# Patient Record
Sex: Male | Born: 1945
Health system: Southern US, Community
[De-identification: ages and names within clinical notes are randomized; demographics above are authoritative.]

## PROBLEM LIST (undated history)

## (undated) DIAGNOSIS — M1990 Unspecified osteoarthritis, unspecified site: Secondary | ICD-10-CM

## (undated) DIAGNOSIS — F419 Anxiety disorder, unspecified: Secondary | ICD-10-CM

## (undated) DIAGNOSIS — J189 Pneumonia, unspecified organism: Secondary | ICD-10-CM

## (undated) DIAGNOSIS — C4491 Basal cell carcinoma of skin, unspecified: Secondary | ICD-10-CM

## (undated) DIAGNOSIS — G629 Polyneuropathy, unspecified: Secondary | ICD-10-CM

## (undated) DIAGNOSIS — F32A Depression, unspecified: Secondary | ICD-10-CM

## (undated) DIAGNOSIS — K429 Umbilical hernia without obstruction or gangrene: Secondary | ICD-10-CM

## (undated) DIAGNOSIS — I2699 Other pulmonary embolism without acute cor pulmonale: Secondary | ICD-10-CM

## (undated) DIAGNOSIS — M5416 Radiculopathy, lumbar region: Secondary | ICD-10-CM

## (undated) DIAGNOSIS — K219 Gastro-esophageal reflux disease without esophagitis: Secondary | ICD-10-CM

## (undated) DIAGNOSIS — IMO0001 Reserved for inherently not codable concepts without codable children: Secondary | ICD-10-CM

## (undated) DIAGNOSIS — F329 Major depressive disorder, single episode, unspecified: Secondary | ICD-10-CM

## (undated) DIAGNOSIS — K649 Unspecified hemorrhoids: Secondary | ICD-10-CM

## (undated) DIAGNOSIS — M109 Gout, unspecified: Secondary | ICD-10-CM

## (undated) DIAGNOSIS — I499 Cardiac arrhythmia, unspecified: Secondary | ICD-10-CM

## (undated) DIAGNOSIS — I10 Essential (primary) hypertension: Secondary | ICD-10-CM

## (undated) DIAGNOSIS — G4730 Sleep apnea, unspecified: Secondary | ICD-10-CM

## (undated) DIAGNOSIS — K635 Polyp of colon: Secondary | ICD-10-CM

## (undated) HISTORY — PX: COLONOSCOPY: SHX174

## (undated) HISTORY — DX: Gastro-esophageal reflux disease without esophagitis: K21.9

## (undated) HISTORY — PX: ESOPHAGOGASTRODUODENOSCOPY: SHX1529

## (undated) HISTORY — PX: HERNIA REPAIR: SHX51

## (undated) HISTORY — DX: Reserved for inherently not codable concepts without codable children: IMO0001

## (undated) HISTORY — DX: Unspecified osteoarthritis, unspecified site: M19.90

## (undated) HISTORY — DX: Radiculopathy, lumbar region: M54.16

## (undated) HISTORY — DX: Anxiety disorder, unspecified: F41.9

## (undated) HISTORY — DX: Other pulmonary embolism without acute cor pulmonale: I26.99

---

## 2005-11-16 ENCOUNTER — Ambulatory Visit: Payer: Self-pay | Admitting: Surgery

## 2006-09-16 ENCOUNTER — Ambulatory Visit: Payer: Self-pay | Admitting: Gastroenterology

## 2008-01-01 ENCOUNTER — Ambulatory Visit: Payer: Self-pay | Admitting: Family Medicine

## 2008-08-16 ENCOUNTER — Ambulatory Visit: Payer: Self-pay | Admitting: Gastroenterology

## 2010-01-29 DIAGNOSIS — C4491 Basal cell carcinoma of skin, unspecified: Secondary | ICD-10-CM

## 2010-01-29 HISTORY — DX: Basal cell carcinoma of skin, unspecified: C44.91

## 2011-03-18 ENCOUNTER — Ambulatory Visit: Payer: Self-pay | Admitting: Gastroenterology

## 2011-12-08 ENCOUNTER — Ambulatory Visit: Payer: Self-pay | Admitting: Physical Medicine and Rehabilitation

## 2012-07-24 ENCOUNTER — Emergency Department: Payer: Self-pay | Admitting: Emergency Medicine

## 2012-07-24 LAB — COMPREHENSIVE METABOLIC PANEL
Albumin: 4.1 g/dL (ref 3.4–5.0)
Alkaline Phosphatase: 98 U/L (ref 50–136)
Bilirubin,Total: 0.7 mg/dL (ref 0.2–1.0)
Calcium, Total: 9 mg/dL (ref 8.5–10.1)
Creatinine: 0.97 mg/dL (ref 0.60–1.30)
EGFR (African American): >60
Glucose: 86 mg/dL (ref 65–99)
Osmolality: 275 (ref 275–301)
Sodium: 139 mmol/L (ref 136–145)
Total Protein: 7.8 g/dL (ref 6.4–8.2)

## 2012-07-24 LAB — TROPONIN I: Troponin-I: <0.02 ng/mL

## 2012-07-24 LAB — CK TOTAL AND CKMB (NOT AT ARMC): CK, Total: 113 U/L (ref 35–232)

## 2012-07-24 LAB — CBC
HCT: 38.5 % — ABNORMAL LOW (ref 40.0–52.0)
MCV: 91 fL (ref 80–100)
Platelet: 223 10*3/uL (ref 150–440)
RBC: 4.22 10*6/uL — ABNORMAL LOW (ref 4.40–5.90)
WBC: 10 10*3/uL (ref 3.8–10.6)

## 2012-08-08 LAB — CBC
HCT: 37.8 % — ABNORMAL LOW (ref 40.0–52.0)
MCH: 31.1 pg (ref 26.0–34.0)
MCV: 89 fL (ref 80–100)
Platelet: 291 10*3/uL (ref 150–440)
RBC: 4.23 10*6/uL — ABNORMAL LOW (ref 4.40–5.90)
RDW: 13.9 % (ref 11.5–14.5)

## 2012-08-08 LAB — CK TOTAL AND CKMB (NOT AT ARMC)
CK, Total: 125 U/L (ref 35–232)
CK-MB: 1.8 ng/mL (ref 0.5–3.6)

## 2012-08-08 LAB — BASIC METABOLIC PANEL
Anion Gap: 9 (ref 7–16)
Calcium, Total: 9.2 mg/dL (ref 8.5–10.1)
Chloride: 96 mmol/L — ABNORMAL LOW (ref 98–107)
Co2: 28 mmol/L (ref 21–32)
Creatinine: 1.07 mg/dL (ref 0.60–1.30)
Osmolality: 264 (ref 275–301)
Potassium: 3.5 mmol/L (ref 3.5–5.1)
Sodium: 133 mmol/L — ABNORMAL LOW (ref 136–145)

## 2012-08-09 ENCOUNTER — Inpatient Hospital Stay: Payer: Self-pay | Admitting: Internal Medicine

## 2012-08-09 LAB — TSH: Thyroid Stimulating Horm: 2.32 u[IU]/mL

## 2012-08-09 LAB — PRO B NATRIURETIC PEPTIDE: B-Type Natriuretic Peptide: 145 pg/mL — ABNORMAL HIGH (ref 0–125)

## 2012-08-10 LAB — CBC WITH DIFFERENTIAL/PLATELET
Basophil #: 0 10*3/uL (ref 0.0–0.1)
Basophil %: 0.2 %
Eosinophil %: 0.2 %
Lymphocyte #: 2.1 10*3/uL (ref 1.0–3.6)
MCH: 30.8 pg (ref 26.0–34.0)
MCHC: 34.7 g/dL (ref 32.0–36.0)
MCV: 89 fL (ref 80–100)
Monocyte #: 0.6 x10 3/mm (ref 0.2–1.0)
Platelet: 276 10*3/uL (ref 150–440)
RBC: 3.75 10*6/uL — ABNORMAL LOW (ref 4.40–5.90)
RDW: 13.7 % (ref 11.5–14.5)

## 2012-08-10 LAB — BASIC METABOLIC PANEL
BUN: 13 mg/dL (ref 7–18)
Calcium, Total: 8.7 mg/dL (ref 8.5–10.1)
Co2: 27 mmol/L (ref 21–32)
EGFR (African American): >60
EGFR (Non-African Amer.): >60
Glucose: 99 mg/dL (ref 65–99)
Osmolality: 276 (ref 275–301)
Sodium: 138 mmol/L (ref 136–145)

## 2012-08-10 LAB — LIPID PANEL
Cholesterol: 200 mg/dL (ref 0–200)
HDL Cholesterol: 39 mg/dL — ABNORMAL LOW (ref 40–60)
Triglycerides: 94 mg/dL (ref 0–200)
VLDL Cholesterol, Calc: 19 mg/dL (ref 5–40)

## 2012-08-10 LAB — MAGNESIUM: Magnesium: 2.2 mg/dL

## 2013-04-07 ENCOUNTER — Emergency Department: Payer: Self-pay | Admitting: Emergency Medicine

## 2013-04-07 LAB — BASIC METABOLIC PANEL
Anion Gap: 4 — ABNORMAL LOW (ref 7–16)
BUN: 17 mg/dL (ref 7–18)
Chloride: 101 mmol/L (ref 98–107)
Co2: 31 mmol/L (ref 21–32)
Glucose: 120 mg/dL — ABNORMAL HIGH (ref 65–99)
Potassium: 3.7 mmol/L (ref 3.5–5.1)
Sodium: 136 mmol/L (ref 136–145)

## 2013-04-07 LAB — CBC
HGB: 13.3 g/dL (ref 13.0–18.0)
MCV: 90 fL (ref 80–100)
Platelet: 301 10*3/uL (ref 150–440)
RDW: 13.9 % (ref 11.5–14.5)
WBC: 9.3 10*3/uL (ref 3.8–10.6)

## 2013-04-07 LAB — PROTIME-INR: INR: 1

## 2013-04-27 ENCOUNTER — Emergency Department: Payer: Self-pay | Admitting: Emergency Medicine

## 2013-04-27 LAB — CBC WITH DIFFERENTIAL/PLATELET
Basophil %: 0.8 %
Eosinophil %: 14.2 %
HCT: 37 % — ABNORMAL LOW (ref 40.0–52.0)
HGB: 12.8 g/dL — ABNORMAL LOW (ref 13.0–18.0)
Lymphocyte %: 20.9 %
MCH: 30.8 pg (ref 26.0–34.0)
Monocyte #: 0.5 x10 3/mm (ref 0.2–1.0)
Monocyte %: 5.8 %
Neutrophil %: 58.3 %
WBC: 8.7 10*3/uL (ref 3.8–10.6)

## 2013-04-27 LAB — PROTIME-INR: Prothrombin Time: 13.2 secs (ref 11.5–14.7)

## 2013-04-27 LAB — BASIC METABOLIC PANEL
Anion Gap: 7 (ref 7–16)
Calcium, Total: 9.2 mg/dL (ref 8.5–10.1)
Chloride: 102 mmol/L (ref 98–107)
EGFR (African American): >60
EGFR (Non-African Amer.): >60
Glucose: 130 mg/dL — ABNORMAL HIGH (ref 65–99)

## 2013-04-27 LAB — CK TOTAL AND CKMB (NOT AT ARMC): CK-MB: 1.4 ng/mL (ref 0.5–3.6)

## 2013-04-27 LAB — TROPONIN I: Troponin-I: <0.02 ng/mL

## 2013-04-27 LAB — MAGNESIUM: Magnesium: 1.7 mg/dL — ABNORMAL LOW

## 2013-05-25 ENCOUNTER — Ambulatory Visit: Payer: Self-pay

## 2013-05-27 ENCOUNTER — Emergency Department: Payer: Self-pay | Admitting: Emergency Medicine

## 2013-06-12 ENCOUNTER — Ambulatory Visit: Payer: Self-pay | Admitting: Internal Medicine

## 2013-06-19 ENCOUNTER — Ambulatory Visit: Payer: Self-pay | Admitting: Specialist

## 2013-06-25 ENCOUNTER — Ambulatory Visit: Payer: Self-pay | Admitting: Specialist

## 2013-08-09 ENCOUNTER — Ambulatory Visit: Payer: Self-pay | Admitting: Internal Medicine

## 2013-08-15 ENCOUNTER — Ambulatory Visit: Payer: Self-pay | Admitting: Internal Medicine

## 2013-11-07 ENCOUNTER — Ambulatory Visit: Payer: Self-pay | Admitting: Orthopedic Surgery

## 2013-11-11 ENCOUNTER — Inpatient Hospital Stay: Payer: Self-pay | Admitting: Internal Medicine

## 2013-11-11 LAB — TSH: Thyroid Stimulating Horm: 1.97 u[IU]/mL

## 2013-11-11 LAB — CBC WITH DIFFERENTIAL/PLATELET
Eosinophil #: 0.2 10*3/uL (ref 0.0–0.7)
HGB: 12.4 g/dL — ABNORMAL LOW (ref 13.0–18.0)
Lymphocyte #: 1.3 10*3/uL (ref 1.0–3.6)
Lymphocyte %: 17.1 %
MCH: 29.6 pg (ref 26.0–34.0)
MCV: 87 fL (ref 80–100)
RBC: 4.18 10*6/uL — ABNORMAL LOW (ref 4.40–5.90)
WBC: 7.8 10*3/uL (ref 3.8–10.6)

## 2013-11-11 LAB — COMPREHENSIVE METABOLIC PANEL
Albumin: 3.8 g/dL (ref 3.4–5.0)
Anion Gap: 5 — ABNORMAL LOW (ref 7–16)
BUN: 13 mg/dL (ref 7–18)
Bilirubin,Total: 0.4 mg/dL (ref 0.2–1.0)
Calcium, Total: 8.9 mg/dL (ref 8.5–10.1)
Chloride: 103 mmol/L (ref 98–107)
Creatinine: 0.99 mg/dL (ref 0.60–1.30)
EGFR (African American): >60
EGFR (Non-African Amer.): >60
Glucose: 92 mg/dL (ref 65–99)
Osmolality: 274 (ref 275–301)
Potassium: 4 mmol/L (ref 3.5–5.1)
SGPT (ALT): 26 U/L (ref 12–78)
Sodium: 137 mmol/L (ref 136–145)

## 2013-11-11 LAB — TROPONIN I: Troponin-I: <0.02 ng/mL

## 2013-11-11 LAB — PRO B NATRIURETIC PEPTIDE: B-Type Natriuretic Peptide: 18 pg/mL (ref 0–125)

## 2013-11-11 LAB — PROTIME-INR
INR: 1.1
Prothrombin Time: 14 secs (ref 11.5–14.7)

## 2013-11-12 ENCOUNTER — Ambulatory Visit: Payer: Self-pay | Admitting: Oncology

## 2013-11-12 LAB — CBC WITH DIFFERENTIAL/PLATELET
Basophil #: 0.1 10*3/uL (ref 0.0–0.1)
Basophil %: 0.8 %
Eosinophil #: 0.1 10*3/uL (ref 0.0–0.7)
Eosinophil %: 1.4 %
HCT: 33.4 % — ABNORMAL LOW (ref 40.0–52.0)
Lymphocyte #: 2.6 10*3/uL (ref 1.0–3.6)
Lymphocyte %: 28 %
MCH: 30.3 pg (ref 26.0–34.0)
MCHC: 35.1 g/dL (ref 32.0–36.0)
MCV: 86 fL (ref 80–100)
Monocyte %: 5 %
Neutrophil #: 6.1 10*3/uL (ref 1.4–6.5)
Neutrophil %: 64.8 %
Platelet: 205 10*3/uL (ref 150–440)
RBC: 3.87 10*6/uL — ABNORMAL LOW (ref 4.40–5.90)
WBC: 9.4 10*3/uL (ref 3.8–10.6)

## 2013-11-12 LAB — BASIC METABOLIC PANEL
Anion Gap: 9 (ref 7–16)
BUN: 14 mg/dL (ref 7–18)
Calcium, Total: 8.4 mg/dL — ABNORMAL LOW (ref 8.5–10.1)
Co2: 28 mmol/L (ref 21–32)
Creatinine: 0.98 mg/dL (ref 0.60–1.30)
EGFR (African American): >60
Glucose: 120 mg/dL — ABNORMAL HIGH (ref 65–99)
Potassium: 3.3 mmol/L — ABNORMAL LOW (ref 3.5–5.1)
Sodium: 138 mmol/L (ref 136–145)

## 2013-11-16 ENCOUNTER — Emergency Department: Payer: Self-pay | Admitting: Emergency Medicine

## 2013-11-16 LAB — CBC
HCT: 37.8 % — ABNORMAL LOW (ref 40.0–52.0)
HGB: 12.8 g/dL — ABNORMAL LOW (ref 13.0–18.0)
MCH: 29.7 pg (ref 26.0–34.0)
RDW: 16 % — ABNORMAL HIGH (ref 11.5–14.5)
WBC: 8.2 10*3/uL (ref 3.8–10.6)

## 2013-11-16 LAB — BASIC METABOLIC PANEL
BUN: 10 mg/dL (ref 7–18)
Co2: 32 mmol/L (ref 21–32)
Creatinine: 1.31 mg/dL — ABNORMAL HIGH (ref 0.60–1.30)
EGFR (Non-African Amer.): 57 — ABNORMAL LOW
Glucose: 112 mg/dL — ABNORMAL HIGH (ref 65–99)

## 2013-11-16 LAB — PROTIME-INR
INR: 1.7
Prothrombin Time: 20.1 secs — ABNORMAL HIGH (ref 11.5–14.7)

## 2013-11-16 LAB — TROPONIN I: Troponin-I: <0.02 ng/mL

## 2013-11-19 ENCOUNTER — Ambulatory Visit: Payer: Self-pay | Admitting: Oncology

## 2013-12-24 ENCOUNTER — Ambulatory Visit: Payer: Self-pay | Admitting: Oncology

## 2013-12-30 ENCOUNTER — Emergency Department: Payer: Self-pay | Admitting: Emergency Medicine

## 2013-12-30 LAB — BASIC METABOLIC PANEL
Anion Gap: 4 — ABNORMAL LOW (ref 7–16)
BUN: 10 mg/dL (ref 7–18)
CHLORIDE: 100 mmol/L (ref 98–107)
Calcium, Total: 8.7 mg/dL (ref 8.5–10.1)
Co2: 28 mmol/L (ref 21–32)
Creatinine: 1.02 mg/dL (ref 0.60–1.30)
EGFR (African American): >60
EGFR (Non-African Amer.): >60
Glucose: 96 mg/dL (ref 65–99)
Osmolality: 263 (ref 275–301)
Potassium: 3.9 mmol/L (ref 3.5–5.1)
SODIUM: 132 mmol/L — AB (ref 136–145)

## 2013-12-30 LAB — CBC
HCT: 36.4 % — ABNORMAL LOW (ref 40.0–52.0)
HGB: 12.5 g/dL — ABNORMAL LOW (ref 13.0–18.0)
MCH: 30.2 pg (ref 26.0–34.0)
MCHC: 34.2 g/dL (ref 32.0–36.0)
MCV: 88 fL (ref 80–100)
Platelet: 268 10*3/uL (ref 150–440)
RBC: 4.13 10*6/uL — ABNORMAL LOW (ref 4.40–5.90)
RDW: 15 % — ABNORMAL HIGH (ref 11.5–14.5)
WBC: 8.6 10*3/uL (ref 3.8–10.6)

## 2014-01-20 ENCOUNTER — Ambulatory Visit: Payer: Self-pay | Admitting: Oncology

## 2014-02-04 ENCOUNTER — Ambulatory Visit: Payer: Self-pay

## 2014-04-15 ENCOUNTER — Ambulatory Visit: Payer: Self-pay | Admitting: Vascular Surgery

## 2014-04-15 HISTORY — PX: IVC FILTER INSERTION: CATH118245

## 2014-04-15 LAB — PROTIME-INR
INR: 2.3
PROTHROMBIN TIME: 24.3 s — AB (ref 11.5–14.7)

## 2014-04-17 ENCOUNTER — Ambulatory Visit: Payer: Self-pay | Admitting: Internal Medicine

## 2014-04-18 ENCOUNTER — Ambulatory Visit: Payer: Self-pay | Admitting: Gastroenterology

## 2014-04-18 ENCOUNTER — Ambulatory Visit: Payer: Self-pay | Admitting: Internal Medicine

## 2014-05-16 ENCOUNTER — Ambulatory Visit: Payer: Self-pay | Admitting: Gastroenterology

## 2014-05-17 LAB — PATHOLOGY REPORT

## 2014-05-25 DIAGNOSIS — I2699 Other pulmonary embolism without acute cor pulmonale: Secondary | ICD-10-CM

## 2014-05-25 DIAGNOSIS — R739 Hyperglycemia, unspecified: Secondary | ICD-10-CM | POA: Insufficient documentation

## 2014-05-25 DIAGNOSIS — R519 Headache, unspecified: Secondary | ICD-10-CM | POA: Insufficient documentation

## 2014-05-25 DIAGNOSIS — G8929 Other chronic pain: Secondary | ICD-10-CM | POA: Insufficient documentation

## 2014-05-25 DIAGNOSIS — R51 Headache: Secondary | ICD-10-CM

## 2014-05-25 DIAGNOSIS — G4733 Obstructive sleep apnea (adult) (pediatric): Secondary | ICD-10-CM | POA: Insufficient documentation

## 2014-05-25 DIAGNOSIS — K219 Gastro-esophageal reflux disease without esophagitis: Secondary | ICD-10-CM | POA: Insufficient documentation

## 2014-05-25 DIAGNOSIS — F419 Anxiety disorder, unspecified: Secondary | ICD-10-CM | POA: Insufficient documentation

## 2014-05-25 DIAGNOSIS — I10 Essential (primary) hypertension: Secondary | ICD-10-CM | POA: Insufficient documentation

## 2014-05-25 HISTORY — DX: Other pulmonary embolism without acute cor pulmonale: I26.99

## 2014-06-25 ENCOUNTER — Ambulatory Visit: Payer: Self-pay | Admitting: Specialist

## 2014-09-25 DIAGNOSIS — M5416 Radiculopathy, lumbar region: Secondary | ICD-10-CM

## 2014-09-25 DIAGNOSIS — Z6841 Body Mass Index (BMI) 40.0 and over, adult: Secondary | ICD-10-CM

## 2014-09-25 DIAGNOSIS — M48061 Spinal stenosis, lumbar region without neurogenic claudication: Secondary | ICD-10-CM | POA: Insufficient documentation

## 2014-09-25 DIAGNOSIS — I739 Peripheral vascular disease, unspecified: Secondary | ICD-10-CM | POA: Insufficient documentation

## 2014-09-25 DIAGNOSIS — F325 Major depressive disorder, single episode, in full remission: Secondary | ICD-10-CM | POA: Insufficient documentation

## 2014-09-25 HISTORY — DX: Radiculopathy, lumbar region: M54.16

## 2014-11-11 DIAGNOSIS — E538 Deficiency of other specified B group vitamins: Secondary | ICD-10-CM | POA: Insufficient documentation

## 2014-12-31 DIAGNOSIS — R61 Generalized hyperhidrosis: Secondary | ICD-10-CM | POA: Diagnosis not present

## 2014-12-31 DIAGNOSIS — J019 Acute sinusitis, unspecified: Secondary | ICD-10-CM | POA: Diagnosis not present

## 2014-12-31 DIAGNOSIS — K649 Unspecified hemorrhoids: Secondary | ICD-10-CM | POA: Diagnosis not present

## 2014-12-31 DIAGNOSIS — I2782 Chronic pulmonary embolism: Secondary | ICD-10-CM | POA: Diagnosis not present

## 2015-01-02 DIAGNOSIS — G4733 Obstructive sleep apnea (adult) (pediatric): Secondary | ICD-10-CM | POA: Diagnosis not present

## 2015-01-03 DIAGNOSIS — I2782 Chronic pulmonary embolism: Secondary | ICD-10-CM | POA: Diagnosis not present

## 2015-01-08 DIAGNOSIS — I2782 Chronic pulmonary embolism: Secondary | ICD-10-CM | POA: Diagnosis not present

## 2015-01-13 DIAGNOSIS — E538 Deficiency of other specified B group vitamins: Secondary | ICD-10-CM | POA: Diagnosis not present

## 2015-01-15 DIAGNOSIS — Z7901 Long term (current) use of anticoagulants: Secondary | ICD-10-CM | POA: Diagnosis not present

## 2015-01-21 DIAGNOSIS — G4733 Obstructive sleep apnea (adult) (pediatric): Secondary | ICD-10-CM | POA: Diagnosis not present

## 2015-01-29 DIAGNOSIS — Z7901 Long term (current) use of anticoagulants: Secondary | ICD-10-CM | POA: Diagnosis not present

## 2015-02-02 DIAGNOSIS — G4733 Obstructive sleep apnea (adult) (pediatric): Secondary | ICD-10-CM | POA: Diagnosis not present

## 2015-02-11 DIAGNOSIS — R200 Anesthesia of skin: Secondary | ICD-10-CM | POA: Diagnosis not present

## 2015-02-11 DIAGNOSIS — M79605 Pain in left leg: Secondary | ICD-10-CM | POA: Diagnosis not present

## 2015-02-11 DIAGNOSIS — M79671 Pain in right foot: Secondary | ICD-10-CM | POA: Diagnosis not present

## 2015-02-11 DIAGNOSIS — G629 Polyneuropathy, unspecified: Secondary | ICD-10-CM | POA: Insufficient documentation

## 2015-02-11 DIAGNOSIS — M79604 Pain in right leg: Secondary | ICD-10-CM | POA: Diagnosis not present

## 2015-02-12 DIAGNOSIS — F331 Major depressive disorder, recurrent, moderate: Secondary | ICD-10-CM | POA: Diagnosis not present

## 2015-02-12 DIAGNOSIS — I739 Peripheral vascular disease, unspecified: Secondary | ICD-10-CM | POA: Diagnosis not present

## 2015-02-12 DIAGNOSIS — R739 Hyperglycemia, unspecified: Secondary | ICD-10-CM | POA: Diagnosis not present

## 2015-02-12 DIAGNOSIS — I10 Essential (primary) hypertension: Secondary | ICD-10-CM | POA: Diagnosis not present

## 2015-02-25 DIAGNOSIS — I2699 Other pulmonary embolism without acute cor pulmonale: Secondary | ICD-10-CM | POA: Diagnosis not present

## 2015-02-25 DIAGNOSIS — J441 Chronic obstructive pulmonary disease with (acute) exacerbation: Secondary | ICD-10-CM | POA: Diagnosis not present

## 2015-02-25 DIAGNOSIS — Z6841 Body Mass Index (BMI) 40.0 and over, adult: Secondary | ICD-10-CM | POA: Diagnosis not present

## 2015-02-25 DIAGNOSIS — J449 Chronic obstructive pulmonary disease, unspecified: Secondary | ICD-10-CM | POA: Diagnosis not present

## 2015-03-03 DIAGNOSIS — G4733 Obstructive sleep apnea (adult) (pediatric): Secondary | ICD-10-CM | POA: Diagnosis not present

## 2015-03-06 DIAGNOSIS — Z85828 Personal history of other malignant neoplasm of skin: Secondary | ICD-10-CM | POA: Diagnosis not present

## 2015-03-06 DIAGNOSIS — J019 Acute sinusitis, unspecified: Secondary | ICD-10-CM | POA: Diagnosis not present

## 2015-03-06 DIAGNOSIS — I2699 Other pulmonary embolism without acute cor pulmonale: Secondary | ICD-10-CM | POA: Diagnosis not present

## 2015-03-06 DIAGNOSIS — C44219 Basal cell carcinoma of skin of left ear and external auricular canal: Secondary | ICD-10-CM | POA: Diagnosis not present

## 2015-03-06 DIAGNOSIS — L820 Inflamed seborrheic keratosis: Secondary | ICD-10-CM | POA: Diagnosis not present

## 2015-03-06 DIAGNOSIS — D1800 Hemangioma unspecified site: Secondary | ICD-10-CM | POA: Diagnosis not present

## 2015-03-06 DIAGNOSIS — L821 Other seborrheic keratosis: Secondary | ICD-10-CM | POA: Diagnosis not present

## 2015-03-06 DIAGNOSIS — Z1283 Encounter for screening for malignant neoplasm of skin: Secondary | ICD-10-CM | POA: Diagnosis not present

## 2015-03-06 DIAGNOSIS — L578 Other skin changes due to chronic exposure to nonionizing radiation: Secondary | ICD-10-CM | POA: Diagnosis not present

## 2015-03-06 DIAGNOSIS — F419 Anxiety disorder, unspecified: Secondary | ICD-10-CM | POA: Diagnosis not present

## 2015-03-06 DIAGNOSIS — L859 Epidermal thickening, unspecified: Secondary | ICD-10-CM | POA: Diagnosis not present

## 2015-03-06 DIAGNOSIS — D485 Neoplasm of uncertain behavior of skin: Secondary | ICD-10-CM | POA: Diagnosis not present

## 2015-03-13 DIAGNOSIS — Z7901 Long term (current) use of anticoagulants: Secondary | ICD-10-CM | POA: Diagnosis not present

## 2015-03-27 DIAGNOSIS — J418 Mixed simple and mucopurulent chronic bronchitis: Secondary | ICD-10-CM | POA: Diagnosis not present

## 2015-03-27 DIAGNOSIS — Z7901 Long term (current) use of anticoagulants: Secondary | ICD-10-CM | POA: Diagnosis not present

## 2015-03-27 DIAGNOSIS — I10 Essential (primary) hypertension: Secondary | ICD-10-CM | POA: Diagnosis not present

## 2015-03-27 DIAGNOSIS — I739 Peripheral vascular disease, unspecified: Secondary | ICD-10-CM | POA: Diagnosis not present

## 2015-03-27 DIAGNOSIS — F331 Major depressive disorder, recurrent, moderate: Secondary | ICD-10-CM | POA: Diagnosis not present

## 2015-04-03 DIAGNOSIS — G4733 Obstructive sleep apnea (adult) (pediatric): Secondary | ICD-10-CM | POA: Diagnosis not present

## 2015-04-04 ENCOUNTER — Other Ambulatory Visit: Payer: Self-pay | Admitting: Specialist

## 2015-04-04 DIAGNOSIS — R911 Solitary pulmonary nodule: Secondary | ICD-10-CM

## 2015-04-08 NOTE — Discharge Summary (Signed)
PATIENT NAME:  Adrian [name], Adrian [name] MR#:  [id number] DATE OF BIRTH:  1946/02/13  DATE OF ADMISSION:  08/09/2012 DATE OF DISCHARGE:  08/10/2012  DISCHARGE DIAGNOSES:  1. Pulmonary emboli, multiple small.  2. Hypertension.  3. Anxiety.   DISCHARGE MEDICATIONS:  1. Xarelto 15 mg twice a day for a total of three weeks, which will be 20 more days and then 20 mg daily for 6 to 12 months planned. 2. Allopurinol 30 mg daily.  3. AcipHex 20 mg daily.  4. Benazepril/hydrochlorothiazide 20/12.5 mg daily.  5. Citalopram 40 mg daily.   HISTORY AND PHYSICAL: Please see detailed history and physical done on admission.   HOSPITAL COURSE: Briefly, the patient was admitted with shortness of breath and found to have multiple pulmonary emboli after large outpatient work-up and previous visits to the Emergency Room and to the office were unsuccessful finding an etiology. He improved quickly on Lovenox. After a long discussion with the patient and his wife, we decided to switch him to Xarelto for treatment of pulmonary emboli. The patient's wife did understand the difficulties with warfarin and the dangerous aspect of that medication as well as she had been on it. He tolerated Xarelto well, improved, was less short of breath, and was not hypoxic. Ultrasounds of his lower extremities were done and negative. He did have a history of some hemorrhoids and bleeding from time to time, nothing marked, and it has not been long since colonoscopy which was normal, per the patient. We will therefore discharge him to home. He has follow-up         scheduled with me on 08/15/2012 already. He will keep that and let me know if he has any trouble in the meantime.  TIME SPENT: It took approximately 34 minutes to do discharge tasks today. ____________________________ Ocie Cornfield. Ouida Sills, MD mwa:slb D: 08/10/2012 07:47:38 ET T: 08/10/2012 12:11:12 ET JOB#: [id number]  cc: Ocie Cornfield. Ouida Sills, MD, <Dictator> Kirk Ruths  MD ELECTRONICALLY SIGNED 08/11/2012 7:25

## 2015-04-08 NOTE — H&P (Signed)
PATIENT NAME:  Adrian [name], Adrian [name] MR#:  [id number] DATE OF BIRTH:  1946-06-24  DATE OF ADMISSION:  08/09/2012  PRIMARY CARE PHYSICIAN: Dr. Frazier Richards, [facility]. ER PHYSICIAN: Dr. Michel Santee  ADMITTING PHYSICIAN: Dr. Pearletha Furl   PRESENTING COMPLAINT: Shortness of breath for the last two weeks.   HISTORY: Patient is a 69 year old man who was in his usual state of health until about 2 to 3 weeks ago when he started having episodes of shortness of breath. Patient denies any loss of consciousness. No PND, orthopnea, pedal edema. No history of chest pain. Admits shortness of breath was exertional. Of note, he recently started taking some glucosamine tablets a week prior to onset of the problems and felt symptoms were related to his seafood allergy at which time he discontinued the glucosamine, however, continues to have intermittent exertional dyspnea. For these symptoms he went to see his primary care physician who obtained an EKG and chest x-ray which was nondiagnostic and he was referred to the cardiologist today and had an echocardiogram today which according to the patient was nondiagnostic at which time he was scheduled for a stress test but symptoms got worse this evening and he returned to the Emergency Room and was referred to the hospitalist. At the time of my examination patient was not in any distress, sleeping comfortably on the gurney. Was already seen by ER physician and premedication protocol for CAT scan to rule out PE was in process.   REVIEW OF SYSTEMS: CONSTITUTIONAL: Positive for weakness. No weight loss or weight gain. EYES: No blurred vision, discharge, or redness. ENT: No tinnitus, epistaxis, or difficulty swallowing. RESPIRATORY: Denies any cough. Admits to some wheezing and occasional painful respiration. CARDIOVASCULAR: Denies any chest pain. No PND. No orthopnea. No pedal edema but admits to exertional dyspnea. GASTROINTESTINAL: No nausea, vomiting, diarrhea, abdominal pain. No  change in bowel habits. GENITOURINARY: No dysuria, frequency, incontinence. ENDOCRINE: No polyuria, polydipsia, heat or cold intolerance. HEMATOLOGIC: No anemia, easy bruising, bleeding, or swollen glands. SKIN: No rashes, change in hair or skin texture. MUSCULOSKELETAL: No joint pain, redness, swelling. NEUROLOGIC: No weakness, dysarthria, headaches, memory loss. PSYCH: No anxiety, depression.    PAST MEDICAL HISTORY:  1. Hypertension. 2. Gastroesophageal reflux disease. 3. Gout.  4. History of anxiety.   5. Osteoarthritis.   PAST SURGICAL HISTORY: Hernia repair.   SOCIAL HISTORY: Patient is a retired Systems developer. Active cigarette smoker, 1 pack per day for the last 50 years. Lives with his wife. No recent long distance travel or sick contact.   FAMILY HISTORY: Reviewed and noncontributory.   ALLERGIES: Seafood and Sudafed which gives him allergic reaction and prednisone which gives him generalized anxiety.   MEDICATIONS:  1. Citalopram 40 mg daily.  2. Allopurinol 300 mg once a day. 3. Benazepril/HCTZ 20/12.5, 1 tablet daily.  4. AcipHex 20 mg daily.   PHYSICAL EXAMINATION:  VITAL SIGNS: Temperature 98.9, pulse 90, respiratory rate 24, blood pressure 164/80 on arrival and now 139/70, oxygen saturation 98% on room air.   GENERAL: Elderly well built, well nourished male lying on the gurney awake, alert, oriented to time, place, and person, in no overt distress.   HEENT: Atraumatic, normocephalic. Pupils equal, reactive to light and accommodation. Extraocular movement intact. Mucous membranes pink, moist.   NECK: Supple. No JV distention.   CHEST: Good air entry. Clear to auscultation. No rhonchi, no rales.   HEART: Regular rate and rhythm. No murmurs.   ABDOMEN: Pendulous, moves with respiration, nontender. Bowel sounds  normoactive. No organomegaly.   EXTREMITIES: No edema, clubbing, deformity.   NEUROLOGICAL: No focal deficit.   PSYCH: Affect appropriate to  situation.   LABORATORY, DIAGNOSTIC, AND RADIOLOGICAL DATA: EKG showed normal sinus rhythm at 81, left anterior fascicular block. CBC: White count 10, hemoglobin 13, platelets 291. Chemistry unremarkable except for low sodium of 133, down from 139 from two weeks ago, potassium 3.5, creatinine 1, glucose 88, calcium 9.2. CK 125. Troponin negative.   IMPRESSION:  1. Recurrent dyspnea, query cause differential include pulmonary embolus versus arrhythmia versus early onset of chronic obstructive pulmonary disease.  2. Hypertension, stable.  3. Intermittent pleuritic chest pain, stable.  4. Gastroesophageal reflux disease, stable.  5. Gout, stable. 6. Anxiety.  7. Osteoarthritis.  8. Tobacco misuse.   PLAN:  1. Admit to general medical floor under observation. For CT angiogram for PE protocol with premedication for allergies.  2. Lovenox weight  based.  3. IV fluids.  4. Respiratory support with oxygen, nebulizers, p.r.n. steroids.  5. Check BNP, serial cardiac enzymes. Check TSH, magnesium. 6. Smoking cessation advised. Nicotine patch offered.  7. Place on rest of outpatient medications and adjust management as needed.  8. CODE STATUS: FULL CODE.   TOTAL PATIENT CARE TIME: 50 minutes.   Will transfer to Dr. Tonette Bihari service in the morning.   ____________________________ Jules Husbands. Pearletha Furl, MD mia:cms D: 08/09/2012 01:33:34 ET T: 08/09/2012 06:57:07 ET JOB#: [id number]  cc: Adrian [name] I. Pearletha Furl, MD, <Dictator> Adrian [name]. Ouida Sills, MD Adrian Frost MD ELECTRONICALLY SIGNED 08/10/2012 0:33

## 2015-04-11 NOTE — Discharge Summary (Signed)
PATIENT NAME:  Adrian [name], Adrian [name] MR#:  [id number] DATE OF BIRTH:  02/12/1946  DATE OF ADMISSION:  11/11/2013 DATE OF DISCHARGE:  11/13/2013  DISCHARGE DIAGNOSES: 1.  Bilateral pulmonary emboli. 2.  History of pulmonary emboli.  3.  Back pain and sciatica possibly inducing above.  4.  Hypertension, controlled.   DISCHARGE MEDICATIONS: Per [facility] med reconciliation system, notable changes: Xarelto 15 mg [name].i.d. for 3 weeks then 20 mg daily which at this point will plan on long-term anticoagulation.   HISTORY AND PHYSICAL: Please see detailed history and physical done on admission.   HOSPITAL COURSE: The patient was admitted with chest pain and shortness of breath. Found to have bilateral pulmonary emboli on CT scan of the chest. He was initially started on heparin, which he tolerated. However, he had tolerated Xarelto in the past and I changed him to that the next morning when I saw him. Consultation with hematology ensued who initially agreed with Xarelto and the thoughts about lifelong anticoagulation. We sent some further hypercoagulability labs off which are not back at the time of this dictation. They wish for him to follow-up with them in the future for such. He was agreeable and they were to schedule the appointment. He did not have DVTs found on bilateral lower extremity Dopplers. CBCs were unremarkable as were MET-[name] with the exception of some mild hypokalemia. I will see him in a week or 2 for followup.   TIME SPENT: It took approximately 35 minutes to do all discharge tasks today. ____________________________ Marshall W. Anderson, MD mwa:sb D: 11/13/2013 10:00:47 ET T: 11/13/2013 10:20:29 ET JOB#: [id number]  cc: Marshall W. Anderson, MD, <Dictator> MARSHALL W ANDERSON MD ELECTRONICALLY SIGNED 11/13/2013 17:06 

## 2015-04-11 NOTE — H&P (Signed)
PATIENT NAME:  Adrian [name], Adrian [name] MR#:  [id number] DATE OF BIRTH:  01-04-1946  DATE OF ADMISSION:  11/11/2013  PRIMARY CARE PROVIDER: Dr. Frazier Richards  EMERGENCY DEPARTMENT REFERRING PHYSICIAN: Dr. Jimmye Norman.   CHIEF COMPLAINT: Left-sided chest pain as well as shortness of breath.   HISTORY OF PRESENT ILLNESS: The patient is a 69 year old white male who was admitted last year, in August, with shortness of breath, after he had some immobility after a fracture of his elbow, who at that time was diagnosed with bilateral pulmonary embolism. He was treated with Xarelto for six months and then Xarelto was subsequently stopped. He was doing okay, and then, recently, for the past few weeks, he has not been able to move around much because he has developed a pinched nerve, and over the last two days he started having a sharp, stabbing, left-sided pain in his lower ribs. He also started having shortness of breath. The pain got worse and his breathing got worse, so he came to the ED. He had a CT angiogram of the chest, which showed acute pulmonary embolism seen bilaterally within all lobar pulmonary artery branches and the distal right pulmonary artery. Therefore, we were asked to admit the patient. He otherwise denies any fevers, chills. He states that his ankle has been swollen, but does not complain of any calf pain. Denies any nausea, vomiting, diarrhea, or any urinary symptoms.   PAST MEDICAL HISTORY: Significant for history of bilateral pulmonary embolism, hypertension, GERD, history of gout, anxiety, osteoarthritis.   PAST SURGICAL HISTORY: Status post hernia repair.   ALLERGIES: ONLY SEAFOOD. HE SAYS THAT HE IS SENSITIVE TO SUDAFED, WHICH CAUSES BLOOD PRESSURE TO GO HIGH. PREDNISONE CAUSES HIM ANXIETY BUT IS NOT ALLERGIC TO THESE.   SOCIAL HISTORY: Continues to smoke less than a pack per day. He has been smoking for 51 years. Lives with his wife. Denies any significant alcohol use.   FAMILY HISTORY:  Father had a brain tumor.   REVIEW OF SYSTEMS:  CONSTITUTIONAL: Denies any fevers, complains of some fatigue, weakness, pain in his left chest. No weight loss. No weight gain.  EYES: No blurred or double vision. No pain. No redness. No inflammation. No glaucoma. No cataracts.  ENT: No tinnitus. No ear pain. No hearing loss. No seasonal or year-round allergies. No epistaxis. No difficulty swallowing.  RESPIRATORY: Denies any cough, wheezing, hemoptysis. No asthma. Complains of shortness of breath.  CARDIOVASCULAR: Complains of left-sided chest pain. No orthopnea. No edema. No arrhythmia. Complains of no syncope.  GASTROINTESTINAL: No nausea, vomiting, diarrhea. No abdominal pain. No hematemesis. No melena. No ulcer. Does have a history of GERD.  GENITOURINARY: Denies any dysuria, hematuria, renal calculus or frequency.  ENDOCRINE: Denies any polyuria, nocturia or thyroid problems.  HEMATOLOGIC AND LYMPHATIC: Denies any major bruisability or bleeding.  SKIN: No acne. No rash. No changes in mole, hair or skin.  MUSCULOSKELETAL: Complains of pain in the back. Has a history of gout.  NEUROLOGIC: Complains of numbness in the right lower leg as a result of a herniated disk. No CVA. No transient ischemic attack. No seizures.  PSYCHIATRIC: Has a history of anxiety. No insomnia. No ADD.   PHYSICAL EXAMINATION: VITAL SIGNS: Temperature 97.8, pulse was 75, respirations 22, blood pressure 144/73, O2 was 91% on room air.  GENERAL: The patient is an obese male who is mildly uncomfortable because of pain.  HEENT: Head atraumatic, normocephalic. Pupils equally round, reactive to light and accommodation. There is no conjunctival pallor. No scleral  icterus. Nasal exam shows no drainage or ulceration.  OROPHARYNX: Clear, without any exudate.  NECK: Supple without any JVD.  CARDIOVASCULAR: Regular rate and rhythm. No murmurs, rubs, clicks or gallops. PMI is not displaced.  LUNGS: Clear to auscultation  bilaterally, without any rales, rhonchi, wheezing. The patient is mildly tachypneic. ABDOMEN: Soft, nontender, nondistended. Positive bowel sounds x4.  EXTREMITIES: No clubbing, cyanosis, or edema.  SKIN: No rash.  LYMPHATICS: No lymph nodes palpable.  VASCULAR: Good DP, PT pulses.  PSYCHIATRIC: Not anxious or depressed.  NEUROLOGIC: Awake, alert, oriented x3. No focal deficits.  LYMPHATICS: No lymph nodes palpable.   EVALUATION: CT angiogram of the chest shows acute pulmonary emboli seen bilaterally with lobar pulmonary artery branches and the distal right pulmonary artery. Small pleural effusion noted. BNP was 18. WBC 7.8, hemoglobin 12.4, platelet count is 200. His D-dimer was 2.03. Troponin less than 0.02. TSH 1.97. BMP: Glucose 92, BUN 13, creatinine 0.99, sodium 137, potassium 4.0, chloride 13, CO2 is 29. LFTs are normal.   ASSESSMENT AND PLAN: The patient is a 69 year old white male with history of second episode of pulmonary embolism. 1.  Bilateral extensive pulmonary embolism. I will send hypercoagulable work-up including factor V Leyden mutation evaluation, protein CNS, prothrombin gene mutation, anticardiolipin antibodies. I will also ask hematology to come evaluate the patient tomorrow. Will start him on a heparin drip, due to the extensive nature of the clots. He has been on Xarelto before, and this could be a possible therapeutic option. Will see what hematology recommends. 2.  Hypertension: Blood pressure is currently normal. I will hold his antihypertensives for the time being. 3.  Gout. We will continue allopurinol. 4.  Gastroesophageal reflux disease. He is on AcipHex. Will place him on Protonix while in the hospital. 5.  Nicotine addiction. The patient was counseled regarding smoking cessation, 3 minutes spent. Nicotine patch offered. He does not want to try to use a nicotine patch at this point.   TIME SPENT: 50 minutes spent on this H and P.       ____________________________ Lafonda Mosses. Posey Pronto, MD shp:cg D: 11/11/2013 20:42:00 ET T: 11/12/2013 00:18:53 ET JOB#: [id number]  cc: Demarri Elie H. Posey Pronto, MD, <Dictator> Alric Seton MD ELECTRONICALLY SIGNED 11/21/2013 15:03

## 2015-04-11 NOTE — Consult Note (Signed)
History of Present Illness:  Reason for Consult Patient 2nd occurrence of PE.   HPI   Patient is a 69 year old male with past medical history significant of an unprovoked PE approximately 2 years ago.  Patient took 6 months of anticoagulation afterward.  He has had no other clot or PE since that time until he presented recently to the emergency room with left-sided chest pain and increased shortness of breath.  Patient was found to have bilateral PEs.  No DVT was evident on lower extremity ultrasound.  He has no other personal history of clot.  He denies any family history of clot.  There do not appear to be any transient risk factors.  He has no neurologic complaints.  He denies any recent fevers.  His chest pain and shortness of breath have resolved.  He denies any nausea, vomiting, constipation, or diarrhea.  He denies any weight loss.  He has no nausea, vomiting, constipation, or diarrhea.  He denies any melena or hematochezia.  He has no urinary complaints.  Patient otherwise feels well and offers no further specific complaints.    PFSH:  Additional Past Medical and Surgical History PE, hypertension, GERD, gout, anxiety, arthritis, hernia repair.  Family history: Father with brain tumor.  Social history: Positive tobacco, less than one pack per day.  Denies alcohol use.   Review of Systems:  Performance Status (ECOG) 0   Review of Systems   As per HPI. Otherwise, 10 point system review was negative.   NURSING NOTES: **Vital Signs.:   24-Nov-14 13:38   Vital Signs Type: Q 4hr   Temperature Temperature (F): 97.9   Celsius: 36.6   Temperature Source: oral   Pulse Pulse: 62   Respirations Respirations: 20   Systolic BP Systolic BP: 093   Diastolic BP (mmHg) Diastolic BP (mmHg): 76   Mean BP: 100   Pulse Ox % Pulse Ox %: 97   Pulse Ox Activity Level: At rest   Oxygen Delivery: Room Air/ 21 %   Physical Exam:  Physical Exam General: Well-developed, well-nourished,  no acute distress. Eyes: Pink conjunctiva, anicteric sclera. HEENT: Normocephalic, moist mucous membranes, clear oropharnyx. Lungs: Clear to auscultation bilaterally. Heart: Regular rate and rhythm. No rubs, murmurs, or gallops. Abdomen: Soft, nontender, nondistended. No organomegaly noted, normoactive bowel sounds. Musculoskeletal: No edema, cyanosis, or clubbing. Neuro: Alert, answering all questions appropriately. Cranial nerves grossly intact. Skin: No rashes or petechiae noted. Psych: Normal affect. Lymphatics: No cervical, calvicular, axillary or inguinal LAD.    Prednisone: Anxiety  Sudafed: Hypertension  glucosamine: Unknown  Seafood: Unknown    allopurinol 300 mg oral tablet: 1 tab(s) orally once a day, Status: Active, Quantity: 0, Refills: None   AcipHex 20 mg oral delayed release tablet: 1 tab(s) orally once a day, Status: Active, Quantity: 0, Refills: None   benazepril-hydrochlorothiazide 20 mg-12.5 mg oral tablet: 1 tab(s) orally once a day, Status: Active, Quantity: 0, Refills: None   citalopram 40 mg oral tablet: 1 tab(s) orally once a day, Status: Active, Quantity: 0, Refills: None   Aleve sodium 220 mg oral tablet: 2 tabs (463m) orally once a day (in the morning), Status: Active, Quantity: 0, Refills: None   acetaminophen-oxyCODONE 325 mg-5 mg oral tablet: 2 tab(s) orally every 4 to 6 hours as needed for pain, Status: Active, Quantity: 0, Refills: None   Tylenol 500 mg oral tablet: 2 tabs (10024m orally once a day as needed for pain, Status: Active, Quantity: 0, Refills: None   One-A-Day Men  50 Plus Multiple Vitamins with Minerals oral tablet: 1 tab(s) orally once a day, Status: Active, Quantity: 0, Refills: None  Laboratory Results: Routine Chem:  24-Nov-14 04:54   Glucose, Serum  120  BUN 14  Creatinine (comp) 0.98  Sodium, Serum 138  Potassium, Serum  3.3  Chloride, Serum 101  CO2, Serum 28  Calcium (Total), Serum  8.4  Anion Gap 9  Osmolality  (calc) 277  eGFR (African American) >60  eGFR (Non-African American) >60 (eGFR values <41m/min/1.73 m2 may be an indication of chronic kidney disease (CKD). Calculated eGFR is useful in patients with stable renal function. The eGFR calculation will not be reliable in acutely ill patients when serum creatinine is changing rapidly. It is not useful in  patients on dialysis. The eGFR calculation may not be applicable to patients at the low and high extremes of body sizes, pregnant women, and vegetarians.)  Routine Coag:  24-Nov-14 04:54   Activated PTT (APTT)  56.6 (A HCT value >55% may artifactually increase the APTT. In one study, the increase was an average of 19%. Reference: "Effect on Routine and Special Coagulation Testing Values of Citrate Anticoagulant Adjustment in Patients with High HCT Values." American Journal of Clinical Pathology 2006;126:400-405.)  Routine Hem:  24-Nov-14 04:54   WBC (CBC) 9.4  RBC (CBC)  3.87  Hemoglobin (CBC)  11.7  Hematocrit (CBC)  33.4  Platelet Count (CBC) 205  MCV 86  MCH 30.3  MCHC 35.1  RDW  15.8  Neutrophil % 64.8  Lymphocyte % 28.0  Monocyte % 5.0  Eosinophil % 1.4  Basophil % 0.8  Neutrophil # 6.1  Lymphocyte # 2.6  Monocyte # 0.5  Eosinophil # 0.1  Basophil # 0.1 (Result(s) reported on 12 Nov 2013 at 05:09AM.)   Assessment and Plan: Impression:   2nd lifetime occurrence of unprovoked PE. Plan:   1.  PE: This is the patient's 2nd lifetime occurrence of unprovoked PE.  He does not appear to have any transient risk factors.  A full hypercoagulable workup has been initiated and is currently pending.  Agree with anticoagulation with Xarelto.  Given the fact that this is patient's 2nd lifetime PE, even if he did not have an underlying clotting disorder would recommend lifelong anticoagulation.  OK to discharge from a hematology standpoint.  Patient will followup in the [place] for further evaluation and discussion of his  hypercoagulability workup.  He expressed understanding and was in agreement with this plan. consult, call with questions.  Electronic Signatures: FDelight Hoh(MD)  (Signed [id number] [id number])  Authored: HISTORY OF PRESENT ILLNESS, PFSH, ROS, NURSING NOTES, PE, ALLERGIES, HOME MEDICATIONS, LABS, ASSESSMENT AND PLAN   Last Updated: 24-Nov-14 14:38 by FDelight Hoh(MD)

## 2015-04-12 NOTE — Op Note (Signed)
PATIENT NAME:  Adrian [name], Adrian [name] MR#:  [id number] DATE OF BIRTH:  [birth year]  DATE OF PROCEDURE:  04/15/2014  PREOPERATIVE DIAGNOSES:  1. Deep vein thrombosis/ pulmonary embolism.  2. Gastrointestinal bleed.   POSTOPERATIVE DIAGNOSES:  1. Deep vein thrombosis/ pulmonary embolism.  2. Gastrointestinal bleed.   PROCEDURES:  1. Ultrasound guidance for vascular access to right femoral vein.  2. Catheter placement into inferior vena cava from right femoral venous approach.  3. Inferior venacavogram.  4. Placement of a Bard Denali IVC filter.   SURGEON: Algernon Huxley, MD   ANESTHESIA: Local with moderate conscious sedation.   ESTIMATED BLOOD LOSS: Minimal.  FLUOROSCOPY TIME: One minute.   CONTRAST USED: 15 mL.   INDICATION FOR PROCEDURE:  A 69 year old white male who saw me in the office with a diagnosis of a GI bleed. He had been previously on anticoagulation for recurrent DVT and PE, now needing to stop his anticoagulation and have a colonoscopy. We are performing a filter to reduce his risk of pulmonary embolus and allow him to have a colonoscopy to assess his GI bleeding as well. Risks and benefits were discussed. Informed consent was obtained.   DESCRIPTION OF PROCEDURE: The patient is brought to the vascular suite. Groins were shaved and prepped and a sterile surgical field was created. The right femoral vein was visualized with ultrasound and found to be patent. It was then accessed under direct ultrasound guidance without difficulty with a Seldinger needle. A J-wire was then placed after skin nick and dilatation. The delivery sheath was placed into the inferior vena cava. An inferior venacavogram was then performed. This demonstrated the level of the renal veins to about the midportion of L1. The filter was then deployed at the L2 space below the renal veins in excellent location. Delivery sheath was removed. Pressure was held. Sterile dressing was placed. The patient tolerated the  procedure well and was taken to the recovery room in stable condition.   ____________________________ Algernon Huxley, MD jsd:dd D: 04/15/2014 14:13:57 ET T: 04/15/2014 20:02:54 ET JOB#: [id number]  cc: Algernon Huxley, MD, <Dictator> Ocie Cornfield. Ouida Sills, MD Lupita Dawn. Candace Cruise, MD Algernon Huxley MD ELECTRONICALLY SIGNED 04/25/2014 14:19

## 2015-04-22 DIAGNOSIS — K130 Diseases of lips: Secondary | ICD-10-CM | POA: Diagnosis not present

## 2015-04-22 DIAGNOSIS — L304 Erythema intertrigo: Secondary | ICD-10-CM | POA: Diagnosis not present

## 2015-04-22 DIAGNOSIS — L089 Local infection of the skin and subcutaneous tissue, unspecified: Secondary | ICD-10-CM | POA: Diagnosis not present

## 2015-04-22 DIAGNOSIS — C44219 Basal cell carcinoma of skin of left ear and external auricular canal: Secondary | ICD-10-CM | POA: Diagnosis not present

## 2015-04-23 DIAGNOSIS — Z7901 Long term (current) use of anticoagulants: Secondary | ICD-10-CM | POA: Diagnosis not present

## 2015-04-25 DIAGNOSIS — M170 Bilateral primary osteoarthritis of knee: Secondary | ICD-10-CM | POA: Diagnosis not present

## 2015-04-30 DIAGNOSIS — Z7901 Long term (current) use of anticoagulants: Secondary | ICD-10-CM | POA: Diagnosis not present

## 2015-05-13 DIAGNOSIS — I2699 Other pulmonary embolism without acute cor pulmonale: Secondary | ICD-10-CM | POA: Diagnosis not present

## 2015-05-13 DIAGNOSIS — R739 Hyperglycemia, unspecified: Secondary | ICD-10-CM | POA: Diagnosis not present

## 2015-05-13 DIAGNOSIS — I739 Peripheral vascular disease, unspecified: Secondary | ICD-10-CM | POA: Diagnosis not present

## 2015-05-13 DIAGNOSIS — Z6841 Body Mass Index (BMI) 40.0 and over, adult: Secondary | ICD-10-CM | POA: Diagnosis not present

## 2015-05-13 DIAGNOSIS — I10 Essential (primary) hypertension: Secondary | ICD-10-CM | POA: Diagnosis not present

## 2015-05-13 DIAGNOSIS — F331 Major depressive disorder, recurrent, moderate: Secondary | ICD-10-CM | POA: Diagnosis not present

## 2015-05-14 DIAGNOSIS — M79604 Pain in right leg: Secondary | ICD-10-CM | POA: Diagnosis not present

## 2015-05-14 DIAGNOSIS — M79605 Pain in left leg: Secondary | ICD-10-CM | POA: Diagnosis not present

## 2015-05-14 DIAGNOSIS — G629 Polyneuropathy, unspecified: Secondary | ICD-10-CM | POA: Diagnosis not present

## 2015-05-14 DIAGNOSIS — R200 Anesthesia of skin: Secondary | ICD-10-CM | POA: Diagnosis not present

## 2015-05-16 ENCOUNTER — Ambulatory Visit
Admission: RE | Admit: 2015-05-16 | Discharge: 2015-05-16 | Disposition: A | Discharge status: Home / Self Care | Payer: Commercial Managed Care - HMO | Source: Ambulatory Visit | Attending: Physician Assistant | Admitting: Physician Assistant

## 2015-05-16 ENCOUNTER — Other Ambulatory Visit: Payer: Self-pay | Admitting: Physician Assistant

## 2015-05-16 DIAGNOSIS — M79604 Pain in right leg: Secondary | ICD-10-CM | POA: Diagnosis not present

## 2015-05-16 DIAGNOSIS — M7989 Other specified soft tissue disorders: Secondary | ICD-10-CM | POA: Diagnosis not present

## 2015-05-16 DIAGNOSIS — M25461 Effusion, right knee: Secondary | ICD-10-CM | POA: Diagnosis not present

## 2015-05-16 DIAGNOSIS — R609 Edema, unspecified: Secondary | ICD-10-CM

## 2015-05-23 DIAGNOSIS — M1711 Unilateral primary osteoarthritis, right knee: Secondary | ICD-10-CM | POA: Diagnosis not present

## 2015-06-02 DIAGNOSIS — M170 Bilateral primary osteoarthritis of knee: Secondary | ICD-10-CM | POA: Diagnosis not present

## 2015-06-16 DIAGNOSIS — G4733 Obstructive sleep apnea (adult) (pediatric): Secondary | ICD-10-CM | POA: Diagnosis not present

## 2015-06-16 DIAGNOSIS — Z7901 Long term (current) use of anticoagulants: Secondary | ICD-10-CM | POA: Diagnosis not present

## 2015-06-30 ENCOUNTER — Ambulatory Visit
Admission: RE | Admit: 2015-06-30 | Discharge: 2015-06-30 | Disposition: A | Discharge status: Home / Self Care | Payer: Commercial Managed Care - HMO | Source: Ambulatory Visit | Attending: Specialist | Admitting: Specialist

## 2015-06-30 DIAGNOSIS — R911 Solitary pulmonary nodule: Secondary | ICD-10-CM | POA: Diagnosis not present

## 2015-06-30 DIAGNOSIS — I2510 Atherosclerotic heart disease of native coronary artery without angina pectoris: Secondary | ICD-10-CM | POA: Diagnosis not present

## 2015-07-02 DIAGNOSIS — M79605 Pain in left leg: Secondary | ICD-10-CM | POA: Diagnosis not present

## 2015-07-02 DIAGNOSIS — G629 Polyneuropathy, unspecified: Secondary | ICD-10-CM | POA: Diagnosis not present

## 2015-07-02 DIAGNOSIS — R200 Anesthesia of skin: Secondary | ICD-10-CM | POA: Diagnosis not present

## 2015-07-02 DIAGNOSIS — M79671 Pain in right foot: Secondary | ICD-10-CM | POA: Diagnosis not present

## 2015-07-02 DIAGNOSIS — M79672 Pain in left foot: Secondary | ICD-10-CM | POA: Diagnosis not present

## 2015-07-02 DIAGNOSIS — M79604 Pain in right leg: Secondary | ICD-10-CM | POA: Diagnosis not present

## 2015-07-07 DIAGNOSIS — R911 Solitary pulmonary nodule: Secondary | ICD-10-CM | POA: Diagnosis not present

## 2015-07-07 DIAGNOSIS — Z6841 Body Mass Index (BMI) 40.0 and over, adult: Secondary | ICD-10-CM | POA: Diagnosis not present

## 2015-07-07 DIAGNOSIS — G4733 Obstructive sleep apnea (adult) (pediatric): Secondary | ICD-10-CM | POA: Diagnosis not present

## 2015-07-07 DIAGNOSIS — F17210 Nicotine dependence, cigarettes, uncomplicated: Secondary | ICD-10-CM | POA: Diagnosis not present

## 2015-07-15 DIAGNOSIS — Z7901 Long term (current) use of anticoagulants: Secondary | ICD-10-CM | POA: Diagnosis not present

## 2015-08-06 DIAGNOSIS — Z7901 Long term (current) use of anticoagulants: Secondary | ICD-10-CM | POA: Diagnosis not present

## 2015-08-13 DIAGNOSIS — J418 Mixed simple and mucopurulent chronic bronchitis: Secondary | ICD-10-CM | POA: Diagnosis not present

## 2015-08-13 DIAGNOSIS — I10 Essential (primary) hypertension: Secondary | ICD-10-CM | POA: Diagnosis not present

## 2015-08-13 DIAGNOSIS — Z0000 Encounter for general adult medical examination without abnormal findings: Secondary | ICD-10-CM | POA: Diagnosis not present

## 2015-08-13 DIAGNOSIS — I739 Peripheral vascular disease, unspecified: Secondary | ICD-10-CM | POA: Diagnosis not present

## 2015-08-13 DIAGNOSIS — R739 Hyperglycemia, unspecified: Secondary | ICD-10-CM | POA: Diagnosis not present

## 2015-08-13 DIAGNOSIS — E538 Deficiency of other specified B group vitamins: Secondary | ICD-10-CM | POA: Diagnosis not present

## 2015-08-27 DIAGNOSIS — Z7901 Long term (current) use of anticoagulants: Secondary | ICD-10-CM | POA: Diagnosis not present

## 2015-09-10 DIAGNOSIS — I10 Essential (primary) hypertension: Secondary | ICD-10-CM | POA: Diagnosis not present

## 2015-09-10 DIAGNOSIS — M4806 Spinal stenosis, lumbar region: Secondary | ICD-10-CM | POA: Diagnosis not present

## 2015-09-23 DIAGNOSIS — Z7901 Long term (current) use of anticoagulants: Secondary | ICD-10-CM | POA: Diagnosis not present

## 2015-09-30 DIAGNOSIS — F325 Major depressive disorder, single episode, in full remission: Secondary | ICD-10-CM | POA: Diagnosis not present

## 2015-09-30 DIAGNOSIS — I10 Essential (primary) hypertension: Secondary | ICD-10-CM | POA: Diagnosis not present

## 2015-09-30 DIAGNOSIS — Z23 Encounter for immunization: Secondary | ICD-10-CM | POA: Diagnosis not present

## 2015-10-10 DIAGNOSIS — M2550 Pain in unspecified joint: Secondary | ICD-10-CM | POA: Diagnosis not present

## 2015-10-22 DIAGNOSIS — Z1283 Encounter for screening for malignant neoplasm of skin: Secondary | ICD-10-CM | POA: Diagnosis not present

## 2015-10-22 DIAGNOSIS — D225 Melanocytic nevi of trunk: Secondary | ICD-10-CM | POA: Diagnosis not present

## 2015-10-22 DIAGNOSIS — D229 Melanocytic nevi, unspecified: Secondary | ICD-10-CM | POA: Diagnosis not present

## 2015-10-22 DIAGNOSIS — L820 Inflamed seborrheic keratosis: Secondary | ICD-10-CM | POA: Diagnosis not present

## 2015-10-22 DIAGNOSIS — L578 Other skin changes due to chronic exposure to nonionizing radiation: Secondary | ICD-10-CM | POA: Diagnosis not present

## 2015-10-22 DIAGNOSIS — Z85828 Personal history of other malignant neoplasm of skin: Secondary | ICD-10-CM | POA: Diagnosis not present

## 2015-10-22 DIAGNOSIS — D1800 Hemangioma unspecified site: Secondary | ICD-10-CM | POA: Diagnosis not present

## 2015-10-22 DIAGNOSIS — C44212 Basal cell carcinoma of skin of right ear and external auricular canal: Secondary | ICD-10-CM | POA: Diagnosis not present

## 2015-10-22 DIAGNOSIS — D485 Neoplasm of uncertain behavior of skin: Secondary | ICD-10-CM | POA: Diagnosis not present

## 2015-10-22 DIAGNOSIS — L821 Other seborrheic keratosis: Secondary | ICD-10-CM | POA: Diagnosis not present

## 2015-10-22 HISTORY — DX: Melanocytic nevi, unspecified: D22.9

## 2015-10-23 DIAGNOSIS — Z7901 Long term (current) use of anticoagulants: Secondary | ICD-10-CM | POA: Diagnosis not present

## 2015-10-28 DIAGNOSIS — G5792 Unspecified mononeuropathy of left lower limb: Secondary | ICD-10-CM | POA: Diagnosis not present

## 2015-10-28 DIAGNOSIS — M791 Myalgia: Secondary | ICD-10-CM | POA: Diagnosis not present

## 2015-10-28 DIAGNOSIS — G5791 Unspecified mononeuropathy of right lower limb: Secondary | ICD-10-CM | POA: Diagnosis not present

## 2015-10-28 DIAGNOSIS — M1990 Unspecified osteoarthritis, unspecified site: Secondary | ICD-10-CM | POA: Diagnosis not present

## 2015-10-28 DIAGNOSIS — M150 Primary generalized (osteo)arthritis: Secondary | ICD-10-CM | POA: Diagnosis not present

## 2015-11-03 DIAGNOSIS — F325 Major depressive disorder, single episode, in full remission: Secondary | ICD-10-CM | POA: Diagnosis not present

## 2015-11-03 DIAGNOSIS — I2699 Other pulmonary embolism without acute cor pulmonale: Secondary | ICD-10-CM | POA: Diagnosis not present

## 2015-11-03 DIAGNOSIS — E538 Deficiency of other specified B group vitamins: Secondary | ICD-10-CM | POA: Diagnosis not present

## 2015-11-03 DIAGNOSIS — I10 Essential (primary) hypertension: Secondary | ICD-10-CM | POA: Diagnosis not present

## 2015-11-03 DIAGNOSIS — J418 Mixed simple and mucopurulent chronic bronchitis: Secondary | ICD-10-CM | POA: Diagnosis not present

## 2015-11-03 DIAGNOSIS — I739 Peripheral vascular disease, unspecified: Secondary | ICD-10-CM | POA: Diagnosis not present

## 2015-11-03 DIAGNOSIS — E7800 Pure hypercholesterolemia, unspecified: Secondary | ICD-10-CM | POA: Diagnosis not present

## 2015-11-05 DIAGNOSIS — Z7901 Long term (current) use of anticoagulants: Secondary | ICD-10-CM | POA: Diagnosis not present

## 2015-11-18 DIAGNOSIS — G4733 Obstructive sleep apnea (adult) (pediatric): Secondary | ICD-10-CM | POA: Diagnosis not present

## 2015-11-25 DIAGNOSIS — C44212 Basal cell carcinoma of skin of right ear and external auricular canal: Secondary | ICD-10-CM | POA: Diagnosis not present

## 2015-12-05 DIAGNOSIS — Z7901 Long term (current) use of anticoagulants: Secondary | ICD-10-CM | POA: Diagnosis not present

## 2015-12-10 ENCOUNTER — Other Ambulatory Visit: Payer: Self-pay | Admitting: Pain Medicine

## 2015-12-10 ENCOUNTER — Ambulatory Visit: Payer: Commercial Managed Care - HMO | Attending: Pain Medicine | Admitting: Pain Medicine

## 2015-12-10 ENCOUNTER — Other Ambulatory Visit
Admission: RE | Admit: 2015-12-10 | Discharge: 2015-12-10 | Disposition: A | Discharge status: Home / Self Care | Payer: Commercial Managed Care - HMO | Source: Ambulatory Visit | Attending: Pain Medicine | Admitting: Pain Medicine

## 2015-12-10 ENCOUNTER — Encounter: Payer: Self-pay | Admitting: Pain Medicine

## 2015-12-10 VITALS — BP 94/71 | HR 75 | Temp 97.9°F | Resp 16 | Ht 72.0 in | Wt 298.0 lb

## 2015-12-10 DIAGNOSIS — Z0189 Encounter for other specified special examinations: Secondary | ICD-10-CM

## 2015-12-10 DIAGNOSIS — M4806 Spinal stenosis, lumbar region: Secondary | ICD-10-CM | POA: Diagnosis not present

## 2015-12-10 DIAGNOSIS — F419 Anxiety disorder, unspecified: Secondary | ICD-10-CM | POA: Insufficient documentation

## 2015-12-10 DIAGNOSIS — M25561 Pain in right knee: Secondary | ICD-10-CM | POA: Insufficient documentation

## 2015-12-10 DIAGNOSIS — F1190 Opioid use, unspecified, uncomplicated: Secondary | ICD-10-CM | POA: Insufficient documentation

## 2015-12-10 DIAGNOSIS — M7918 Myalgia, other site: Secondary | ICD-10-CM | POA: Insufficient documentation

## 2015-12-10 DIAGNOSIS — M792 Neuralgia and neuritis, unspecified: Secondary | ICD-10-CM | POA: Insufficient documentation

## 2015-12-10 DIAGNOSIS — R52 Pain, unspecified: Secondary | ICD-10-CM | POA: Insufficient documentation

## 2015-12-10 DIAGNOSIS — Z86711 Personal history of pulmonary embolism: Secondary | ICD-10-CM | POA: Diagnosis not present

## 2015-12-10 DIAGNOSIS — Z5181 Encounter for therapeutic drug level monitoring: Secondary | ICD-10-CM | POA: Insufficient documentation

## 2015-12-10 DIAGNOSIS — M79606 Pain in leg, unspecified: Secondary | ICD-10-CM

## 2015-12-10 DIAGNOSIS — Z029 Encounter for administrative examinations, unspecified: Secondary | ICD-10-CM | POA: Insufficient documentation

## 2015-12-10 DIAGNOSIS — M25562 Pain in left knee: Secondary | ICD-10-CM | POA: Diagnosis not present

## 2015-12-10 DIAGNOSIS — Z79899 Other long term (current) drug therapy: Secondary | ICD-10-CM | POA: Diagnosis not present

## 2015-12-10 DIAGNOSIS — M5416 Radiculopathy, lumbar region: Secondary | ICD-10-CM | POA: Diagnosis not present

## 2015-12-10 DIAGNOSIS — Z79891 Long term (current) use of opiate analgesic: Secondary | ICD-10-CM

## 2015-12-10 DIAGNOSIS — R7989 Other specified abnormal findings of blood chemistry: Secondary | ICD-10-CM

## 2015-12-10 DIAGNOSIS — E785 Hyperlipidemia, unspecified: Secondary | ICD-10-CM | POA: Diagnosis not present

## 2015-12-10 DIAGNOSIS — E538 Deficiency of other specified B group vitamins: Secondary | ICD-10-CM

## 2015-12-10 DIAGNOSIS — G8929 Other chronic pain: Secondary | ICD-10-CM | POA: Diagnosis not present

## 2015-12-10 DIAGNOSIS — M79643 Pain in unspecified hand: Secondary | ICD-10-CM

## 2015-12-10 DIAGNOSIS — G629 Polyneuropathy, unspecified: Secondary | ICD-10-CM | POA: Insufficient documentation

## 2015-12-10 DIAGNOSIS — K922 Gastrointestinal hemorrhage, unspecified: Secondary | ICD-10-CM | POA: Insufficient documentation

## 2015-12-10 LAB — COMPREHENSIVE METABOLIC PANEL
ALBUMIN: 4.4 g/dL (ref 3.5–5.0)
ALK PHOS: 74 U/L (ref 38–126)
ALT: 20 U/L (ref 17–63)
AST: 23 U/L (ref 15–41)
Anion gap: 7 (ref 5–15)
BUN: 10 mg/dL (ref 6–20)
CALCIUM: 9.2 mg/dL (ref 8.9–10.3)
CHLORIDE: 105 mmol/L (ref 101–111)
CO2: 26 mmol/L (ref 22–32)
CREATININE: 0.96 mg/dL (ref 0.61–1.24)
GFR calc Af Amer: >60 mL/min (ref >60)
GFR calc non Af Amer: >60 mL/min (ref >60)
Glucose, Bld: 98 mg/dL (ref 65–99)
Potassium: 3.7 mmol/L (ref 3.5–5.1)
SODIUM: 138 mmol/L (ref 135–145)
TOTAL PROTEIN: 7.6 g/dL (ref 6.5–8.1)
Total Bilirubin: 0.5 mg/dL (ref 0.3–1.2)

## 2015-12-10 LAB — MAGNESIUM: MAGNESIUM: 2 mg/dL (ref 1.7–2.4)

## 2015-12-10 LAB — C-REACTIVE PROTEIN: CRP: 1.1 mg/dL — AB (ref <1.0)

## 2015-12-10 LAB — FOLATE: Folate: 56 ng/mL (ref >5.9)

## 2015-12-10 LAB — VITAMIN B12: Vitamin B-12: 459 pg/mL (ref 180–914)

## 2015-12-10 LAB — SEDIMENTATION RATE: Sed Rate: 42 mm/hr — ABNORMAL HIGH (ref 0–20)

## 2015-12-10 NOTE — Progress Notes (Signed)
Patient's Name: Adrian [name] MRN: [id number] DOB: 02/25/1946 DOS: 12/10/2015  Primary Reason(s) for Visit: Initial Patient Evaluation CC: Generalized Body Aches and Leg Pain   HPI:   Adrian [name] is a 69 y.o. year old, male patient, who comes today for an initial evaluation. He has Chronic pain; Neuropathy (HCC) (lower extremity peripheral neuropathy diagnosed by EMG and PNCV done by Dr. Melrose Nakayama); Chronic lower extremity pain (Bilateral) (Location of Primary Source of Pain) (R>L); Chronic hand pain (Bilateral) (neuropathy); Chronic knee pain (Bilateral) (R>L); Inflammatory pain; Neuropathic pain; Neurogenic pain; Chronic lumbar radicular pain (Right); Intermittent claudication ([place]); Low serum cobalamin; Long term current use of opiate analgesic; Long term prescription opiate use; Opiate use; Anxiety; Leg pain; Chronic headache; Major depression in remission ([place]); Acid reflux; Bleeding gastrointestinal; Benign hypertension; Blood glucose elevated; HLD (hyperlipidemia); Lumbar canal stenosis; Mixed simple and mucopurulent chronic bronchitis ([place]); Morbid (severe) obesity due to excess calories ([place]); Loss of feeling or sensation; Obstructive apnea; Polyneuropathy ([place]); Degenerative arthritis of hip; Pulmonary embolism ([place]); History of pulmonary embolism x 2; Chronic lumbar radiculopathy (Right); Diffuse myofascial pain syndrome; Encounter for therapeutic drug level monitoring; Encounter for chronic pain management; and Encounter for pain management planning on his problem list.. His primarily concern today is the Generalized Body Aches and Leg Pain     The patient comes into clinic today for the first time for evaluation of his chronic pain condition. His primary pain is described to be the lower extremity. He does have a history of peripheral neuropathy which has been confirmed for the lower extremities. (According to patient.) He indicates having had an EMG/PNCV done by Dr. Melrose Nakayama, which is not currently  available at this point.  Today's Pain Score: 8 , clinically he looks like a 3/10. Reported level of pain is incompatible with clinical obrservations. This may be secondary to a possible lack of understanding on how the pain scale works. Pain Type: Neuropathic pain, Chronic pain (2 years) Pain Location: Other (Comment) (all joints ache especially from hips down.) Pain Descriptors / Indicators: Aching, Burning, Sharp, Throbbing, Tingling Pain Frequency: Constant  Onset and Duration: Gradual, Date of onset: 2 years ago and Present longer than 3 months Cause of pain: Arthritis Severity: Getting worse, NAS-11 at its worse: 10/10, NAS-11 at its best: 5/10, NAS-11 now: 8/10 and NAS-11 on the average: 8/10 Timing: Night and After activity or exercise Aggravating Factors: Lifiting, Prolonged sitting, Prolonged standing and Walking Alleviating Factors: The patient denies any alleviating factors such as acupuncture, bending, biofeedback, stretching, cold packs, hot packs, hypnosis, laying down, medications, nerve blocks, resting, sitting, sleeping, standing, TENS unit, using a brace, relaxation therapy, physical therapy, warm shower or bath, chiropractic manipulation, or walking. Associated Problems: Fatigue, Numbness, Sweating, Swelling, Tingling, Pain that wakes patient up and Pain that does not allow patient to sleep Quality of Pain: Aching, Burning, Sharp, Throbbing and Tingling Previous Examinations or Tests: CT scan, EMG/PNCV, MRI scan, X-rays and Neurological evaluation Previous Treatments: The patient denies Biofeedback, chiropractic manipulations, parietal analgesia, epidural steroid injections, facet blocks, had no therapeutic, morphine pump, narcotic medications, physical therapy, pool exercises, radiofrequency, relaxation therapy, spinal cord stimulator, steroid treatments by mouth, stretching exercises, TENS, traction, or trigger point injections.  Pharmacotherapy Review: The patient's  medication management is currently not under our care Hospital associated UDS Results: No results found for: THCU, COCAINSCRNUR, PCPSCRNUR, MDMA, AMPHETMU, METHADONE, ETOH UDS Results: No UDS available, at this time UDS Interpretation: No UDS available, at this time Substance Use Disorder (SUD) Risk  Level: Pending results of Medical Psychology Evaluation for SUD Pharmacologic Plan: Pending ordered tests and/or consults  Allergies: Adrian [name] has No Known Allergies.  Meds: The patient has a current medication list which includes the following prescription(s): acetaminophen, allopurinol, benazepril, escitalopram, multivitamin, pantoprazole, potassium chloride, torsemide, vitamin b-12, warfarin, and warfarin. Requested Prescriptions    No prescriptions requested or ordered in this encounter    ROS: Cardiovascular History: Blood thinners:  Anticoagulant Pulmonary or Respiratory History: Lung problems, Shortness of breath, Smoker and Sleep apnea Neurological History: Negative for epilepsy, stroke, urinary or fecal inontinence, spina bifida or tethered cord syndrome Psychological-Psychiatric History: Anxiety Gastrointestinal History: Reflux or heatburn Genitourinary History: Negative for nephrolithiasis, hematuria, renal failure or chronic kidney disease Hematological History: Brusing easily and Bleeding easily Endocrine History: Negative for diabetes or thyroid disease Rheumatologic History: Rheumatoid arthritis Musculoskeletal History: Negative for myasthenia gravis, muscular dystrophy, multiple sclerosis or malignant hyperthermia Work History: Retired  [place]: Medical:  Adrian [name]  has a past medical history of Anxiety; Reflux; Cancer ([place]); Arthritis; Pulmonary embolism ([place]) (05/25/2014); and Lumbar radiculopathy (09/25/2014). Family: family history includes Cancer in his father; Diabetes in his mother. Surgical:  has past surgical history that includes Hernia repair. Tobacco:  reports that  he has been smoking.  He has never used smokeless tobacco. Alcohol:  reports that he drinks alcohol. Drug:  reports that he does not use illicit drugs.  Physical Exam: Vitals:  Today's Vitals   12/10/15 1137 12/10/15 1143  BP: 94/71   Pulse: 75   Temp: 97.9 F (36.6 C)   TempSrc: Oral   Resp: 16   Height: 6' (1.829 m)   Weight: 298 lb (135.172 kg)   SpO2: 98%   PainSc: 8  8   PainLoc: Leg   Calculated BMI: Body mass index is 40.41 kg/(m^2). General appearance: alert, cooperative, appears stated age, mild distress and morbidly obese Eyes: conjunctivae/corneas clear. PERRL, EOM's intact. Fundi benign. Lungs: No evidence respiratory distress, no audible rales or ronchi and no use of accessory muscles of respiration Neck: no adenopathy, no carotid bruit, no JVD, supple, symmetrical, trachea midline and thyroid not enlarged, symmetric, no tenderness/mass/nodules Back: symmetric, no curvature. ROM normal. No CVA tenderness. Extremities: extremities normal, atraumatic, no cyanosis or edema Pulses: 2+ and symmetric Skin: Skin color, texture, turgor normal. No rashes or lesions Neurologic: Alert and oriented X 3, normal strength and tone. Normal symmetric reflexes. Normal coordination and gait  Assessment: Encounter Diagnosis:  Primary Diagnosis: Chronic pain [G89.29]  Note: As per protocol, today's visit has been an evaluation only. We have not taken over the patient's controlled substance management.  Plan: Adrian [name] was seen today for generalized body aches and leg pain.  Diagnoses and all orders for this visit:  Chronic pain -     COMPLETE METABOLIC PANEL WITH GFR; Future -     C-reactive protein; Future -     Magnesium; Future -     Sedimentation rate; Future -     Vitamin D2,D3 Panel; Future  Chronic lumbar radicular pain (Right) -     MR Lumbar Spine Wo Contrast; Future  Long term current use of opiate analgesic -     Drugs of abuse screen w/o alc, rtn  urine-sln  Opiate use  Low serum cobalamin -     B12 and Folate Panel; Future  Chronic lumbar radiculopathy (Right)  Encounter for pain management planning     There are no Patient Instructions on file for this visit. Medications discontinued today:  There are no discontinued medications. Medications administered today:  Adrian [name] does not currently have medications on file.  Primary Care Physician: Kirk Ruths., MD Location: [facility] Outpatient Pain Management Facility Note by: Kathlen Brunswick. Dossie Arbour, M.D, DABA, DABAPM, DABPM, DABIPP, FIPP

## 2015-12-10 NOTE — Progress Notes (Signed)
Safety precautions to be maintained throughout the outpatient stay will include: orient to surroundings, keep bed in low position, maintain call bell within reach at all times, provide assistance with transfer out of bed and ambulation.  

## 2015-12-11 ENCOUNTER — Telehealth: Payer: Self-pay

## 2015-12-11 NOTE — Telephone Encounter (Signed)
Pt says Dr. Dossie Arbour was supposed to call him in a prescription but never did.

## 2015-12-11 NOTE — Progress Notes (Signed)
Quick Note:  The combined elevation of the ESR & CRP, may be suggestive of an autoimmune disease. Should this be the case, we will inquire if the patient has had a rheumatologic evaluation looking at the RF levels, ANA levels, and CBC. ______

## 2015-12-13 LAB — 25-HYDROXYVITAMIN D LCMS D2+D3
25-HYDROXY, VITAMIN D-3: 43 ng/mL
25-HYDROXY, VITAMIN D: 43 ng/mL

## 2015-12-16 ENCOUNTER — Telehealth: Payer: Self-pay

## 2015-12-16 DIAGNOSIS — F325 Major depressive disorder, single episode, in full remission: Secondary | ICD-10-CM | POA: Diagnosis not present

## 2015-12-16 DIAGNOSIS — I739 Peripheral vascular disease, unspecified: Secondary | ICD-10-CM | POA: Diagnosis not present

## 2015-12-16 DIAGNOSIS — E7800 Pure hypercholesterolemia, unspecified: Secondary | ICD-10-CM | POA: Diagnosis not present

## 2015-12-16 DIAGNOSIS — I10 Essential (primary) hypertension: Secondary | ICD-10-CM | POA: Diagnosis not present

## 2015-12-16 DIAGNOSIS — R52 Pain, unspecified: Secondary | ICD-10-CM

## 2015-12-16 DIAGNOSIS — E538 Deficiency of other specified B group vitamins: Secondary | ICD-10-CM | POA: Diagnosis not present

## 2015-12-16 DIAGNOSIS — I2699 Other pulmonary embolism without acute cor pulmonale: Secondary | ICD-10-CM | POA: Diagnosis not present

## 2015-12-16 DIAGNOSIS — G8929 Other chronic pain: Secondary | ICD-10-CM

## 2015-12-16 DIAGNOSIS — J418 Mixed simple and mucopurulent chronic bronchitis: Secondary | ICD-10-CM | POA: Diagnosis not present

## 2015-12-16 NOTE — Telephone Encounter (Signed)
Patient states that Dr. Dossie Arbour was supposed to call him in a prednisone taper pack. His pharmacy is [facility] in [place]. Please call in today.

## 2015-12-17 DIAGNOSIS — G8929 Other chronic pain: Secondary | ICD-10-CM | POA: Diagnosis not present

## 2015-12-17 DIAGNOSIS — M25561 Pain in right knee: Secondary | ICD-10-CM | POA: Diagnosis not present

## 2015-12-17 DIAGNOSIS — M150 Primary generalized (osteo)arthritis: Secondary | ICD-10-CM | POA: Diagnosis not present

## 2015-12-17 DIAGNOSIS — G5792 Unspecified mononeuropathy of left lower limb: Secondary | ICD-10-CM | POA: Diagnosis not present

## 2015-12-17 DIAGNOSIS — M25562 Pain in left knee: Secondary | ICD-10-CM | POA: Diagnosis not present

## 2015-12-17 DIAGNOSIS — G5791 Unspecified mononeuropathy of right lower limb: Secondary | ICD-10-CM | POA: Diagnosis not present

## 2015-12-17 DIAGNOSIS — R600 Localized edema: Secondary | ICD-10-CM | POA: Diagnosis not present

## 2015-12-18 LAB — TOXASSURE SELECT 13 (MW), URINE: PDF: 0

## 2015-12-18 MED ORDER — METHYLPREDNISOLONE 4 MG PO TBPK: ORAL_TABLET | ORAL | Status: DC

## 2015-12-18 NOTE — Telephone Encounter (Signed)
Patient went to see Dr. Jefm Bryant yesterday and was put on hydroxychloroquine 200mg  q day And Benlafxine 75mg  Q day Do you still want patient to have prednisone taper ? If so please e-scribe to patients pharmacy.

## 2015-12-30 DIAGNOSIS — F4542 Pain disorder with related psychological factors: Secondary | ICD-10-CM | POA: Diagnosis not present

## 2016-01-06 DIAGNOSIS — Z7901 Long term (current) use of anticoagulants: Secondary | ICD-10-CM | POA: Diagnosis not present

## 2016-02-03 ENCOUNTER — Ambulatory Visit
Admission: RE | Admit: 2016-02-03 | Discharge: 2016-02-03 | Disposition: A | Discharge status: Home / Self Care | Payer: Commercial Managed Care - HMO | Source: Ambulatory Visit | Attending: Pain Medicine | Admitting: Pain Medicine

## 2016-02-03 DIAGNOSIS — M5137 Other intervertebral disc degeneration, lumbosacral region: Secondary | ICD-10-CM | POA: Diagnosis not present

## 2016-02-03 DIAGNOSIS — M4726 Other spondylosis with radiculopathy, lumbar region: Secondary | ICD-10-CM | POA: Diagnosis not present

## 2016-02-03 DIAGNOSIS — M5416 Radiculopathy, lumbar region: Secondary | ICD-10-CM

## 2016-02-03 DIAGNOSIS — M4806 Spinal stenosis, lumbar region: Secondary | ICD-10-CM | POA: Diagnosis not present

## 2016-02-03 DIAGNOSIS — G8929 Other chronic pain: Secondary | ICD-10-CM

## 2016-02-03 NOTE — Progress Notes (Signed)
Quick Note:  This imaging study has been reviewed and not found to have any major pathology requiring urgent or emergency care.  Imaging reveals: 1. Lumbar spondylosis, degenerative disc disease, and short pedicles causing prominent impingement at L4-5 ; moderate impingement at L3-4 and L5-S1; and mild impingement at T11-12, L1-2, and L2-3, as detailed above. The findings are generally similar to the prior exam, with only modest increase in the degree of impingement at L5-S1. 2. Borderline low conus medullaris without a tethering lesion identified.  Consider L3-4 vs L4-5 LESI. ______

## 2016-02-09 ENCOUNTER — Encounter: Payer: Self-pay | Admitting: Pain Medicine

## 2016-02-09 ENCOUNTER — Ambulatory Visit (HOSPITAL_BASED_OUTPATIENT_CLINIC_OR_DEPARTMENT_OTHER): Payer: Commercial Managed Care - HMO | Admitting: Pain Medicine

## 2016-02-09 VITALS — BP 129/79 | HR 73 | Temp 98.2°F | Resp 18 | Ht 72.0 in | Wt 295.0 lb

## 2016-02-09 DIAGNOSIS — M5416 Radiculopathy, lumbar region: Secondary | ICD-10-CM | POA: Diagnosis not present

## 2016-02-09 DIAGNOSIS — M79641 Pain in right hand: Secondary | ICD-10-CM | POA: Diagnosis not present

## 2016-02-09 DIAGNOSIS — M79606 Pain in leg, unspecified: Secondary | ICD-10-CM | POA: Diagnosis not present

## 2016-02-09 DIAGNOSIS — R937 Abnormal findings on diagnostic imaging of other parts of musculoskeletal system: Secondary | ICD-10-CM

## 2016-02-09 DIAGNOSIS — Z86711 Personal history of pulmonary embolism: Secondary | ICD-10-CM | POA: Diagnosis not present

## 2016-02-09 DIAGNOSIS — M4806 Spinal stenosis, lumbar region: Secondary | ICD-10-CM

## 2016-02-09 DIAGNOSIS — M792 Neuralgia and neuritis, unspecified: Secondary | ICD-10-CM

## 2016-02-09 DIAGNOSIS — F418 Other specified anxiety disorders: Secondary | ICD-10-CM | POA: Diagnosis not present

## 2016-02-09 DIAGNOSIS — M545 Low back pain, unspecified: Secondary | ICD-10-CM

## 2016-02-09 DIAGNOSIS — M25561 Pain in right knee: Secondary | ICD-10-CM | POA: Diagnosis not present

## 2016-02-09 DIAGNOSIS — M4726 Other spondylosis with radiculopathy, lumbar region: Secondary | ICD-10-CM

## 2016-02-09 DIAGNOSIS — Z7901 Long term (current) use of anticoagulants: Secondary | ICD-10-CM

## 2016-02-09 DIAGNOSIS — R7989 Other specified abnormal findings of blood chemistry: Secondary | ICD-10-CM | POA: Diagnosis not present

## 2016-02-09 DIAGNOSIS — G629 Polyneuropathy, unspecified: Secondary | ICD-10-CM | POA: Diagnosis not present

## 2016-02-09 DIAGNOSIS — F17200 Nicotine dependence, unspecified, uncomplicated: Secondary | ICD-10-CM | POA: Diagnosis not present

## 2016-02-09 DIAGNOSIS — M48061 Spinal stenosis, lumbar region without neurogenic claudication: Secondary | ICD-10-CM

## 2016-02-09 DIAGNOSIS — R51 Headache: Secondary | ICD-10-CM | POA: Diagnosis not present

## 2016-02-09 DIAGNOSIS — R94130 Abnormal response to nerve stimulation, unspecified: Secondary | ICD-10-CM

## 2016-02-09 DIAGNOSIS — M25562 Pain in left knee: Secondary | ICD-10-CM | POA: Diagnosis not present

## 2016-02-09 DIAGNOSIS — M79642 Pain in left hand: Secondary | ICD-10-CM | POA: Diagnosis not present

## 2016-02-09 DIAGNOSIS — G8929 Other chronic pain: Secondary | ICD-10-CM | POA: Diagnosis not present

## 2016-02-09 DIAGNOSIS — G4733 Obstructive sleep apnea (adult) (pediatric): Secondary | ICD-10-CM | POA: Diagnosis not present

## 2016-02-09 DIAGNOSIS — E785 Hyperlipidemia, unspecified: Secondary | ICD-10-CM | POA: Diagnosis not present

## 2016-02-09 MED ORDER — TIZANIDINE HCL 2 MG PO CAPS: 2.0000 mg | ORAL_CAPSULE | Freq: Three times a day (TID) | ORAL | Status: DC | PRN

## 2016-02-09 NOTE — Progress Notes (Signed)
Patient's Name: Adrian [name] MRN: [id number] DOB: Oct 22, 1946 DOS: 02/09/2016  Primary Reason(s) for Visit: Encounter for Medication Management CC: Leg Pain   HPI  Adrian [name] is a 70 y.o. year old, male patient, who returns today as an established patient. He has Chronic pain; Neuropathy (HCC) (lower extremity peripheral neuropathy diagnosed by EMG and PNCV done by Dr. Melrose [name]); Chronic lower extremity pain (Location of Primary Source of Pain) (Bilateral) (R>L); Chronic hand pain (Bilateral) (neuropathy); Chronic knee pain (Location of Secondary source of pain) (Bilateral) (R>L); Inflammatory pain; Neuropathic pain; Neurogenic pain; Chronic lumbar radicular pain (S1 Dermatome) (Right); Intermittent claudication (Adrian [name]); Low serum cobalamin; Long term current use of opiate analgesic; Long term prescription opiate use; Opiate use; Anxiety; Chronic headache; Major depression in remission (Adrian [name]); Acid reflux; Bleeding gastrointestinal; Benign hypertension; Blood glucose elevated; HLD (hyperlipidemia); Lumbar canal stenosis (Severe: L4-5) (Moderate: L3-4 and L5-S1) (Mild: L1-2, L2-3); Mixed simple and mucopurulent chronic bronchitis (Adrian [name]); Morbid (severe) obesity due to excess calories (Adrian [name]); Loss of feeling or sensation; Obstructive apnea; Polyneuropathy (Adrian [name]); Degenerative arthritis of hip; Pulmonary embolism (Adrian [name]); History of pulmonary embolism x 2; Chronic lumbar radiculopathy (Right); Diffuse myofascial pain syndrome; Encounter for therapeutic drug level monitoring; Encounter for chronic pain management; Encounter for pain management planning; Chronic anticoagulation (Adrian [name] and Adrian [name]) (due to pulmonary embolism); Chronic low back pain (Location of Tertiary source of pain) (Right); Abnormal MRI, lumbar spine (02/03/2016); Lumbar spondylosis; Abnormal NCS (nerve conduction studies); Elevated sedimentation rate; and Elevated C-reactive protein (CRP) on his problem list.. His primarily concern today is the Leg  Pain   The patient returns to the clinics today for his second visit. He still not taking any opioids but today we will start him on [employer]. He takes Adrian [name] and Adrian [name] and the MRI shows that he has some retrolisthesis of L4 over L5 with congenital short pedicles causing central spinal stenosis in that area. We'll bring him back for a lumbar epidural steroid injection once we can get clearance to stop his Adrian [name] and Adrian [name].  Reported Pain Score: 6 , clinically he looks like a 3-4/10 Reported level is inconsistent with clinical obrservations. Pain Type: Chronic pain Pain Location: Leg Pain Orientation: Right, Left Pain Descriptors / Indicators: Aching Pain Frequency: Constant  Date of Last Visit: 12/10/15 Service Provided on Last Visit: Evaluation  Controlled Substance Pharmacotherapy Assessment  Analgesic: The patient is currently not receiving any opioids from our practice.  Lab Work: Illicit Drugs No results found for: THCU, COCAINSCRNUR, PCPSCRNUR, MDMA, AMPHETMU, METHADONE, ETOH  Inflammation Markers Lab Results  Component Value Date   ESRSEDRATE 42* 12/10/2015   CRP 1.1* 12/10/2015    Renal Function Lab Results  Component Value Date   BUN 10 12/10/2015   CREATININE 0.96 12/10/2015   GFRAA >60 12/10/2015   GFRNONAA >60 12/10/2015    Hepatic Function Lab Results  Component Value Date   AST 23 12/10/2015   ALT 20 12/10/2015   ALBUMIN 4.4 12/10/2015    Electrolytes Lab Results  Component Value Date   NA 138 12/10/2015   K 3.7 12/10/2015   CL 105 12/10/2015   CALCIUM 9.2 12/10/2015   MG 2.0 12/10/2015    Allergies  Adrian [name] is allergic to duloxetine; pseudoephedrine hcl; and shellfish allergy.  Meds  The patient has a current medication list which includes the following prescription(s): acetaminophen, allopurinol, benazepril, escitalopram, hydroxychloroquine, multivitamin, pantoprazole, potassium chloride, torsemide, venlafaxine xr, vitamin  b-12, warfarin, gabapentin, oxycodone, and tizanidine.  Current Outpatient Prescriptions on File Prior  to Visit  Medication Sig  . acetaminophen (TYLENOL) 500 MG tablet Take 1,000 mg by mouth every 6 (six) hours as needed.  Marland Kitchen allopurinol (ZYLOPRIM) 300 MG tablet Take 300 mg by mouth daily.  . benazepril (LOTENSIN) 10 MG tablet Take 10 mg by mouth daily.  Marland Kitchen escitalopram (LEXAPRO) 10 MG tablet Take 10 mg by mouth daily.  . Multiple Vitamin (MULTIVITAMIN) tablet Take 1 tablet by mouth daily.  . pantoprazole (PROTONIX) 40 MG tablet Take 40 mg by mouth daily.  . potassium chloride (K-DUR) 10 MEQ tablet Take 10 mEq by mouth daily.  Marland Kitchen torsemide (DEMADEX) 10 MG tablet Take 10 mg by mouth daily.  Marland Kitchen warfarin (Adrian [name]) 6 MG tablet Take 6 mg by mouth daily.   No current facility-administered medications on file prior to visit.    ROS  Constitutional: Afebrile, no chills, well hydrated and well nourished Gastrointestinal: negative Musculoskeletal:negative Neurological: negative Behavioral/Psych: negative  PFSH  Medical:  Adrian [name]  has a past medical history of Anxiety; Reflux; Cancer ([place]); Arthritis; Pulmonary embolism ([place]) (05/25/2014); and Lumbar radiculopathy (09/25/2014). Family: family history includes Cancer in his father; Diabetes in his mother. Surgical:  has past surgical history that includes Hernia repair. Tobacco:  reports that he has been smoking.  He has never used smokeless tobacco. Alcohol:  reports that he drinks alcohol. Drug:  reports that he does not use illicit drugs.  Physical Exam  Vitals:  Today's Vitals   02/09/16 0841 02/09/16 0843  BP:  129/79  Pulse: 73   Temp: 98.2 F (36.8 C)   Resp: 18   Height: 6' (1.829 m)   Weight: 295 lb (133.811 kg)   SpO2: 97%   PainSc: 6  6   PainLoc: Leg     Calculated BMI: Body mass index is 40 kg/(m^2).  General appearance: alert, cooperative, appears stated age and no distress Eyes: PERLA Respiratory: No evidence  respiratory distress, no audible rales or ronchi and no use of accessory muscles of respiration  Lumbar Spine Inspection: No gross anomalies detected Alignment: Symetrical ROM: Decreased  Gait: Antalgic (limping)  Lower Extremities Inspection: No gross anomalies detected ROM: Decreased for both knees. Sensory:  Normal Motor: Unremarkable  Assessment & Plan  Primary Diagnosis & Pertinent Problem List: The primary encounter diagnosis was Chronic lumbar radicular pain (Right). Diagnoses of Chronic anticoagulation (Adrian [name] and Adrian [name]), Chronic pain of lower extremity, unspecified laterality, Chronic knee pain (Bilateral) (R>L), Chronic lumbar radiculopathy (Right), Lumbar canal stenosis, Neuropathic pain, Chronic pain, Chronic low back pain (Location of Tertiary source of pain) (Right), Abnormal MRI, lumbar spine (02/03/2016), Osteoarthritis of spine with radiculopathy, lumbar region, and Abnormal NCS (nerve conduction studies) were also pertinent to this visit.  Visit Diagnosis: 1. Chronic lumbar radicular pain (Right)   2. Chronic anticoagulation (Adrian [name] and Adrian [name])   3. Chronic pain of lower extremity, unspecified laterality   4. Chronic knee pain (Bilateral) (R>L)   5. Chronic lumbar radiculopathy (Right)   6. Lumbar canal stenosis   7. Neuropathic pain   8. Chronic pain   9. Chronic low back pain (Location of Tertiary source of pain) (Right)   10. Abnormal MRI, lumbar spine (02/03/2016)   11. Osteoarthritis of spine with radiculopathy, lumbar region   12. Abnormal NCS (nerve conduction studies)     Problem-specific Plan(s): Lumbar canal stenosis (Severe: L4-5) (Moderate: L3-4 and L5-S1) (Mild: L1-2, L2-3) Congenital short pedicles be responsible for decreased size of the canal. Bulging disc and facet arthropathy are responsible for further decrease  in the canal diameter leading to the intraspinal nerve impingement.  Abnormal MRI, lumbar spine (02/03/2016) Bilateral  renal fluid signal intensity lesions favor cysts. Congenitally short pedicles in the lumbar spine with mild prominence of epidural adipose tissue. There is 3 mm degenerative retrolisthesis at the L4-5 level. No pars defects identified. No significant vertebral marrow edema is identified. Type 1 degenerative endplate findings are present at several levels, most notably anteriorly at L1-2. Additional findings at individual levels are as follows: T11-12: Mild central narrowing of the thecal sac due to disc bulge and short pedicles. This level is only included on the parasagittal images. T12-L1: No impingement. Disc bulge and left facet arthropathy noted. L1-2: Mild central narrowing of the thecal sac due to disc bulge and short pedicles. L2-3: Mild displacement of both L2 nerves in the lateral extraforaminal space due to disc bulge. Borderline central narrowing of the thecal sac. Bilateral facet arthropathy. L3-4: Moderate central narrowing of the thecal sac due to disc bulge, facet arthropathy, and ligamentum flavum redundancy. L4-5: Prominent central narrowing of the thecal sac and mild bilateral subarticular lateral recess stenosis due to disc bulge, facet arthropathy, and short pedicles. Similar degree of impingement compared to prior. L5-S1: Moderate right and mild left foraminal stenosis with mild bilateral subarticular lateral recess stenosis due to disc bulge, intervertebral spurring, and facet arthropathy, findings mildly progressive compared to the prior exam from 2014. IMPRESSION: Lumbar spondylosis, degenerative disc disease, and short pedicles causing prominent impingement at L4-5 ; moderate impingement at L3-4 and L5-S1; and mild impingement at T11-12, L1-2, and L2-3, as detailed above. The findings are generally similar to the prior exam, with only modest increase in the degree of impingement at L5-S1. Borderline low conus medullaris without a tethering lesion identified.  Chronic lower extremity pain  (Location of Primary Source of Pain) (Bilateral) (R>L) Right lower extremity pain follows a dermatomal distribution to the bottom of the foot in what seems to be an S1 dermatome. In the case of the left lower extremity the pain goes down through the area of the knee and into the bottom and insight portion of the foot in what seems to be an S1 as well as an L4 dermatomal distribution.  Chronic knee pain (Location of Secondary source of pain) (Bilateral) (R>L) In the case of the right knee the pain goes to the lateral aspect of the knee. In the case of the left knee the pain goes to the medial aspect of the knee.    Plan of Care  Pharmacotherapy (Medications Ordered): Meds ordered this encounter  Medications  . DISCONTD: tizanidine (ZANAFLEX) 2 MG capsule    Sig: Take 1 capsule (2 mg total) by mouth 3 (three) times daily as needed for muscle spasms.    Dispense:  90 capsule    Refill:  0    Do not place this medication, or any other prescription from our practice, on "Automatic Refill". Patient may have prescription filled one day early if pharmacy is closed on scheduled refill date.    Lab-work & Procedure Ordered: Orders Placed This Encounter  Procedures  . LUMBAR EPIDURAL STEROID INJECTION    Standing Status: Future     Number of Occurrences:      Standing Expiration Date: 02/08/2017    Scheduling Instructions:     Side: Right-sided     Sedation: With Sedation.     Timeframe: Stopped the Adrian [name] for 5 days. Also stopped the Adrian [name].    Order Specific Question:  Where  will this procedure be performed?    Answer:  [facility]    Imaging Ordered: None  Interventional Therapies: Scheduled: The patient is currently on Adrian [name] 200 mg daily plus Adrian [name] 6 mg by mouth daily. We will need to get the clearance from his physician to stop these prior to bring him back for a right sided L4-5 lumbar epidural steroid injection under fluoroscopic guidance and IV sedation. PRN  Procedures: We may complete a series of 3 lumbar epidural steroid injections depending on how he does with the first one. In addition, the patient has bilateral knee pain which may be associated with his lumbar spinal stenosis but it may also the secondary to osteoarthritis.    Referral(s) or Consult(s): None at this point, but we may need to consult a neurosurgeon should he not improve with the above planned therapy.  Medications administered during this visit: Mr. Fellner had no medications administered during this visit.  Future Appointments Date Time Provider [place]  03/08/2016 9:20 AM Milinda Pointer, MD [facility] None    Primary Care Physician: Kirk Ruths., MD Location: [facility] Outpatient Pain Management Facility Note by: Kathlen Brunswick. Dossie Arbour, M.D, DABA, DABAPM, DABPM, DABIPP, FIPP

## 2016-02-09 NOTE — Patient Instructions (Addendum)
Smoking Cessation, Tips for Success If you are ready to quit smoking, congratulations! You have chosen to help yourself be healthier. Cigarettes bring nicotine, tar, carbon monoxide, and other irritants into your body. Your lungs, heart, and blood vessels will be able to work better without these poisons. There are many different ways to quit smoking. Nicotine gum, nicotine patches, a nicotine inhaler, or nicotine nasal spray can help with physical craving. Hypnosis, support groups, and medicines help break the habit of smoking. WHAT THINGS CAN I DO TO MAKE QUITTING EASIER?  Here are some tips to help you quit for good: 1. Pick a date when you will quit smoking completely. Tell all of your friends and family about your plan to quit on that date. 2. Do not try to slowly cut down on the number of cigarettes you are smoking. Pick a quit date and quit smoking completely starting on that day. 3. Throw away all cigarettes.  4. Clean and remove all ashtrays from your home, work, and car. 5. On a card, write down your reasons for quitting. Carry the card with you and read it when you get the urge to smoke. 6. Cleanse your body of nicotine. Drink enough water and fluids to keep your urine clear or pale yellow. Do this after quitting to flush the nicotine from your body. 7. Learn to predict your moods. Do not let a bad situation be your excuse to have a cigarette. Some situations in your life might tempt you into wanting a cigarette. 8. Never have "just one" cigarette. It leads to wanting another and another. Remind yourself of your decision to quit. 9. Change habits associated with smoking. If you smoked while driving or when feeling stressed, try other activities to replace smoking. Stand up when drinking your coffee. Brush your teeth after eating. Sit in a different chair when you read the paper. Avoid alcohol while trying to quit, and try to drink fewer caffeinated beverages. Alcohol and caffeine may urge you  to smoke. 10. Avoid foods and drinks that can trigger a desire to smoke, such as sugary or spicy foods and alcohol. 11. Ask people who smoke not to smoke around you. 12. Have something planned to do right after eating or having a cup of coffee. For example, plan to take a walk or exercise. 13. Try a relaxation exercise to calm you down and decrease your stress. Remember, you may be tense and nervous for the first 2 weeks after you quit, but this will pass. 14. Find new activities to keep your hands busy. Play with a pen, coin, or rubber band. Doodle or draw things on paper. 15. Brush your teeth right after eating. This will help cut down on the craving for the taste of tobacco after meals. You can also try mouthwash.  16. Use oral substitutes in place of cigarettes. Try using lemon drops, carrots, cinnamon sticks, or chewing gum. Keep them handy so they are available when you have the urge to smoke. 17. When you have the urge to smoke, try deep breathing. 18. Designate your home as a nonsmoking area. 19. If you are a heavy smoker, ask your health care provider about a prescription for nicotine chewing gum. It can ease your withdrawal from nicotine. 20. Reward yourself. Set aside the cigarette money you save and buy yourself something nice. 21. Look for support from others. Join a support group or smoking cessation program. Ask someone at home or at work to help you with your plan   to quit smoking. 22. Always ask yourself, "Do I need this cigarette or is this just a reflex?" Tell yourself, "Today, I choose not to smoke," or "I do not want to smoke." You are reminding yourself of your decision to quit. 23. Do not replace cigarette smoking with electronic cigarettes (commonly called e-cigarettes). The safety of e-cigarettes is unknown, and some may contain harmful chemicals. 24. If you relapse, do not give up! Plan ahead and think about what you will do the next time you get the urge to smoke. HOW WILL  I FEEL WHEN I QUIT SMOKING? You may have symptoms of withdrawal because your body is used to nicotine (the addictive substance in cigarettes). You may crave cigarettes, be irritable, feel very hungry, cough often, get headaches, or have difficulty concentrating. The withdrawal symptoms are only temporary. They are strongest when you first quit but will go away within 10-14 days. When withdrawal symptoms occur, stay in control. Think about your reasons for quitting. Remind yourself that these are signs that your body is healing and getting used to being without cigarettes. Remember that withdrawal symptoms are easier to treat than the major diseases that smoking can cause.  Even after the withdrawal is over, expect periodic urges to smoke. However, these cravings are generally short lived and will go away whether you smoke or not. Do not smoke! WHAT RESOURCES ARE AVAILABLE TO HELP ME QUIT SMOKING? Your health care provider can direct you to community resources or hospitals for support, which may include: 1. Group support. 2. Education. 3. Hypnosis. 4. Therapy.   This information is not intended to replace advice given to you by your health care provider. Make sure you discuss any questions you have with your health care provider.   Document Released: 09/03/2004 Document Revised: 12/27/2014 Document Reviewed: 05/24/2013 Elsevier Interactive Patient Education 2016 [employer] GENERAL RISKS AND COMPLICATIONS  What are the risk, side effects and possible complications? Generally speaking, most procedures are safe.  However, with any procedure there are risks, side effects, and the possibility of complications.  The risks and complications are dependent upon the sites that are lesioned, or the type of nerve block to be performed.  The closer the procedure is to the spine, the more serious the risks are.  Great care is taken when placing the radio frequency needles, block needles or lesioning probes,  but sometimes complications can occur. 1. Infection: Any time there is an injection through the skin, there is a risk of infection.  This is why sterile conditions are used for these blocks.  There are four possible types of infection. 1. Localized skin infection. 2. Central Nervous System Infection-This can be in the form of Meningitis, which can be deadly. 3. Epidural Infections-This can be in the form of an epidural abscess, which can cause pressure inside of the spine, causing compression of the spinal cord with subsequent paralysis. This would require an emergency surgery to decompress, and there are no guarantees that the patient would recover from the paralysis. 4. Discitis-This is an infection of the intervertebral discs.  It occurs in about 1% of discography procedures.  It is difficult to treat and it may lead to surgery.        2. Pain: the needles have to go through skin and soft tissues, will cause soreness.       3. Damage to internal structures:  The nerves to be lesioned may be near blood vessels or    other nerves which can   be potentially damaged.       4. Bleeding: Bleeding is more common if the patient is taking blood thinners such as  aspirin, Coumadin, Ticiid, Plavix, etc., or if he/she have some genetic predisposition  such as hemophilia. Bleeding into the spinal canal can cause compression of the spinal  cord with subsequent paralysis.  This would require an emergency surgery to  decompress and there are no guarantees that the patient would recover from the  paralysis.       5. Pneumothorax:  Puncturing of a lung is a possibility, every time a needle is introduced in  the area of the chest or upper back.  Pneumothorax refers to free air around the  collapsed lung(s), inside of the thoracic cavity (chest cavity).  Another two possible  complications related to a similar event would include: Hemothorax and Chylothorax.   These are variations of the Pneumothorax, where instead of air  around the collapsed  lung(s), you may have blood or chyle, respectively.       6. Spinal headaches: They may occur with any procedures in the area of the spine.       7. Persistent CSF (Cerebro-Spinal Fluid) leakage: This is a rare problem, but may occur  with prolonged intrathecal or epidural catheters either due to the formation of a fistulous  track or a dural tear.       8. Nerve damage: By working so close to the spinal cord, there is always a possibility of  nerve damage, which could be as serious as a permanent spinal cord injury with  paralysis.       9. Death:  Although rare, severe deadly allergic reactions known as "Anaphylactic  reaction" can occur to any of the medications used.      10. Worsening of the symptoms:  We can always make thing worse.  What are the chances of something like this happening? Chances of any of this occuring are extremely low.  By statistics, you have more of a chance of getting killed in a motor vehicle accident: while driving to the hospital than any of the above occurring .  Nevertheless, you should be aware that they are possibilities.  In general, it is similar to taking a shower.  Everybody knows that you can slip, hit your head and get killed.  Does that mean that you should not shower again?  Nevertheless always keep in mind that statistics do not mean anything if you happen to be on the wrong side of them.  Even if a procedure has a 1 (one) in a 1,000,000 (million) chance of going wrong, it you happen to be that one..Also, keep in mind that by statistics, you have more of a chance of having something go wrong when taking medications.  Who should not have this procedure? If you are on a blood thinning medication (e.g. Coumadin, Plavix, see list of "Blood Thinners"), or if you have an active infection going on, you should not have the procedure.  If you are taking any blood thinners, please inform your physician.  How should I prepare for this  procedure?  Do not eat or drink anything at least six hours prior to the procedure.  Bring a driver with you .  It cannot be a taxi.  Come accompanied by an adult that can drive you back, and that is strong enough to help you if your legs get weak or numb from the local anesthetic.  Take all of your medicines   the morning of the procedure with just enough water to swallow them.  If you have diabetes, make sure that you are scheduled to have your procedure done first thing in the morning, whenever possible.  If you have diabetes, take only half of your insulin dose and notify our nurse that you have done so as soon as you arrive at the clinic.  If you are diabetic, but only take blood sugar pills (oral hypoglycemic), then do not take them on the morning of your procedure.  You may take them after you have had the procedure.  Do not take aspirin or any aspirin-containing medications, at least eleven (11) days prior to the procedure.  They may prolong bleeding.  Wear loose fitting clothing that may be easy to take off and that you would not mind if it got stained with Betadine or blood.  Do not wear any jewelry or perfume  Remove any nail coloring.  It will interfere with some of our monitoring equipment.  NOTE: Remember that this is not meant to be interpreted as a complete list of all possible complications.  Unforeseen problems may occur.  BLOOD THINNERS The following drugs contain aspirin or other products, which can cause increased bleeding during surgery and should not be taken for 2 weeks prior to and 1 week after surgery.  If you should need take something for relief of minor pain, you may take acetaminophen which is found in Tylenol,m Datril, Anacin-3 and Panadol. It is not blood thinner. The products listed below are.  Do not take any of the products listed below in addition to any listed on your instruction sheet.  A.P.C or A.P.C with Codeine Codeine Phosphate Capsules #3  Ibuprofen Ridaura  ABC compound Congesprin Imuran rimadil  Advil Cope Indocin Robaxisal  Alka-Seltzer Effervescent Pain Reliever and Antacid Coricidin or Coricidin-D  Indomethacin Rufen  Alka-Seltzer plus Cold Medicine Cosprin Ketoprofen S-A-C Tablets  Anacin Analgesic Tablets or Capsules Coumadin Korlgesic Salflex  Anacin Extra Strength Analgesic tablets or capsules CP-2 Tablets Lanoril Salicylate  Anaprox Cuprimine Capsules Levenox Salocol  Anexsia-D Dalteparin Magan Salsalate  Anodynos Darvon compound Magnesium Salicylate Sine-off  Ansaid Dasin Capsules Magsal Sodium Salicylate  Anturane Depen Capsules Marnal Soma  APF Arthritis pain formula Dewitt's Pills Measurin Stanback  Argesic Dia-Gesic Meclofenamic Sulfinpyrazone  Arthritis Bayer Timed Release Aspirin Diclofenac Meclomen Sulindac  Arthritis pain formula Anacin Dicumarol Medipren Supac  Analgesic (Safety coated) Arthralgen Diffunasal Mefanamic Suprofen  Arthritis Strength Bufferin Dihydrocodeine Mepro Compound Suprol  Arthropan liquid Dopirydamole Methcarbomol with Aspirin Synalgos  ASA tablets/Enseals Disalcid Micrainin Tagament  Ascriptin Doan's Midol Talwin  Ascriptin A/D Dolene Mobidin Tanderil  Ascriptin Extra Strength Dolobid Moblgesic Ticlid  Ascriptin with Codeine Doloprin or Doloprin with Codeine Momentum Tolectin  Asperbuf Duoprin Mono-gesic Trendar  Aspergum Duradyne Motrin or Motrin IB Triminicin  Aspirin plain, buffered or enteric coated Durasal Myochrisine Trigesic  Aspirin Suppositories Easprin Nalfon Trillsate  Aspirin with Codeine Ecotrin Regular or Extra Strength Naprosyn Uracel  Atromid-S Efficin Naproxen Ursinus  Auranofin Capsules Elmiron Neocylate Vanquish  Axotal Emagrin Norgesic Verin  Azathioprine Empirin or Empirin with Codeine Normiflo Vitamin E  Azolid Emprazil Nuprin Voltaren  Bayer Aspirin plain, buffered or children's or timed BC Tablets or powders Encaprin Orgaran Warfarin Sodium   Buff-a-Comp Enoxaparin Orudis Zorpin  Buff-a-Comp with Codeine Equegesic Os-Cal-Gesic   Buffaprin Excedrin plain, buffered or Extra Strength Oxalid   Bufferin Arthritis Strength Feldene Oxphenbutazone   Bufferin plain or Extra Strength Feldene Capsules Oxycodone with Aspirin     Bufferin with Codeine Fenoprofen Fenoprofen Pabalate or Pabalate-SF   Buffets II Flogesic Panagesic   Buffinol plain or Extra Strength Florinal or Florinal with Codeine Panwarfarin   Buf-Tabs Flurbiprofen Penicillamine   Butalbital Compound Four-way cold tablets Penicillin   Butazolidin Fragmin Pepto-Bismol   Carbenicillin Geminisyn Percodan   Carna Arthritis Reliever Geopen Persantine   Carprofen Gold's salt Persistin   Chloramphenicol Goody's Phenylbutazone   Chloromycetin Haltrain Piroxlcam   Clmetidine heparin Plaquenil   Cllnoril Hyco-pap Ponstel   Clofibrate Hydroxy chloroquine Propoxyphen         Before stopping any of these medications, be sure to consult the physician who ordered them.  Some, such as Coumadin (Warfarin) are ordered to prevent or treat serious conditions such as "deep thrombosis", "pumonary embolisms", and other heart problems.  The amount of time that you may need off of the medication may also vary with the medication and the reason for which you were taking it.  If you are taking any of these medications, please make sure you notify your pain physician before you undergo any procedures.         Epidural Steroid Injection Patient Information  Description: The epidural space surrounds the nerves as they exit the spinal cord.  In some patients, the nerves can be compressed and inflamed by a bulging disc or a tight spinal canal (spinal stenosis).  By injecting steroids into the epidural space, we can bring irritated nerves into direct contact with a potentially helpful medication.  These steroids act directly on the irritated nerves and can reduce swelling and inflammation which often  leads to decreased pain.  Epidural steroids may be injected anywhere along the spine and from the neck to the low back depending upon the location of your pain.   After numbing the skin with local anesthetic (like Novocaine), a small needle is passed into the epidural space slowly.  You may experience a sensation of pressure while this is being done.  The entire block usually last less than 10 minutes.  Conditions which may be treated by epidural steroids:   Low back and leg pain  Neck and arm pain  Spinal stenosis  Post-laminectomy syndrome  Herpes zoster (shingles) pain  Pain from compression fractures  Preparation for the injection:  5. Do not eat any solid food or dairy products within 6 hours of your appointment.  6. You may drink clear liquids up to 2 hours before appointment.  Clear liquids include water, black coffee, juice or soda.  No milk or cream please. 7. You may take your regular medication, including pain medications, with a sip of water before your appointment  Diabetics should hold regular insulin (if taken separately) and take 1/2 normal NPH dos the morning of the procedure.  Carry some sugar containing items with you to your appointment. 8. A driver must accompany you and be prepared to drive you home after your procedure.  9. Bring all your current medications with your. 10. An IV may be inserted and sedation may be given at the discretion of the physician.   11. A blood pressure cuff, EKG and other monitors will often be applied during the procedure.  Some patients may need to have extra oxygen administered for a short period. 12. You will be asked to provide medical information, including your allergies, prior to the procedure.  We must know immediately if you are taking blood thinners (like Coumadin/Warfarin)  Or if you are allergic to IV iodine contrast (dye). We must   know if you could possible be pregnant.  Possible side-effects:  Bleeding from needle  site  Infection (rare, may require surgery)  Nerve injury (rare)  Numbness & tingling (temporary)  Difficulty urinating (rare, temporary)  Spinal headache ( a headache worse with upright posture)  Light -headedness (temporary)  Pain at injection site (several days)  Decreased blood pressure (temporary)  Weakness in arm/leg (temporary)  Pressure sensation in back/neck (temporary)  Call if you experience:  Fever/chills associated with headache or increased back/neck pain.  Headache worsened by an upright position.  New onset weakness or numbness of an extremity below the injection site  Hives or difficulty breathing (go to the emergency room)  Inflammation or drainage at the infection site  Severe back/neck pain  Any new symptoms which are concerning to you  Please note:  Although the local anesthetic injected can often make your back or neck feel good for several hours after the injection, the pain will likely return.  It takes 3-7 days for steroids to work in the epidural space.  You may not notice any pain relief for at least that one week.  If effective, we will often do a series of three injections spaced 3-6 weeks apart to maximally decrease your pain.  After the initial series, we generally will wait several months before considering a repeat injection of the same type.  If you have any questions, please call [phone] [facility] 

## 2016-02-09 NOTE — Progress Notes (Signed)
Safety precautions to be maintained throughout the outpatient stay will include: orient to surroundings, keep bed in low position, maintain call bell within reach at all times, provide assistance with transfer out of bed and ambulation.  

## 2016-02-10 ENCOUNTER — Telehealth: Payer: Self-pay | Admitting: Pain Medicine

## 2016-02-10 DIAGNOSIS — R94130 Abnormal response to nerve stimulation, unspecified: Secondary | ICD-10-CM | POA: Insufficient documentation

## 2016-02-10 DIAGNOSIS — M47816 Spondylosis without myelopathy or radiculopathy, lumbar region: Secondary | ICD-10-CM | POA: Insufficient documentation

## 2016-02-10 DIAGNOSIS — R937 Abnormal findings on diagnostic imaging of other parts of musculoskeletal system: Secondary | ICD-10-CM | POA: Insufficient documentation

## 2016-02-10 NOTE — Telephone Encounter (Signed)
Spoke with patient and he has decided that he would not like to have procedure and would instead like to try medications.  Dr Dossie Arbour informed.  Patient to schedule appointment for medications.

## 2016-02-10 NOTE — Assessment & Plan Note (Signed)
Bilateral renal fluid signal intensity lesions favor cysts. Congenitally short pedicles in the lumbar spine with mild prominence of epidural adipose tissue. There is 3 mm degenerative retrolisthesis at the L4-5 level. No pars defects identified. No significant vertebral marrow edema is identified. Type 1 degenerative endplate findings are present at several levels, most notably anteriorly at L1-2. Additional findings at individual levels are as follows: T11-12: Mild central narrowing of the thecal sac due to disc bulge and short pedicles. This level is only included on the parasagittal images. T12-L1: No impingement. Disc bulge and left facet arthropathy noted. L1-2: Mild central narrowing of the thecal sac due to disc bulge and short pedicles. L2-3: Mild displacement of both L2 nerves in the lateral extraforaminal space due to disc bulge. Borderline central narrowing of the thecal sac. Bilateral facet arthropathy. L3-4: Moderate central narrowing of the thecal sac due to disc bulge, facet arthropathy, and ligamentum flavum redundancy. L4-5: Prominent central narrowing of the thecal sac and mild bilateral subarticular lateral recess stenosis due to disc bulge, facet arthropathy, and short pedicles. Similar degree of impingement compared to prior. L5-S1: Moderate right and mild left foraminal stenosis with mild bilateral subarticular lateral recess stenosis due to disc bulge, intervertebral spurring, and facet arthropathy, findings mildly progressive compared to the prior exam from 2014. IMPRESSION: Lumbar spondylosis, degenerative disc disease, and short pedicles causing prominent impingement at L4-5 ; moderate impingement at L3-4 and L5-S1; and mild impingement at T11-12, L1-2, and L2-3, as detailed above. The findings are generally similar to the prior exam, with only modest increase in the degree of impingement at L5-S1. Borderline low conus medullaris without a tethering lesion identified.

## 2016-02-10 NOTE — Assessment & Plan Note (Signed)
Congenital short pedicles be responsible for decreased size of the canal. Bulging disc and facet arthropathy are responsible for further decrease in the canal diameter leading to the intraspinal nerve impingement.

## 2016-02-10 NOTE — Telephone Encounter (Signed)
Patient says Nonnie Done told him to call today and ask for her

## 2016-02-11 ENCOUNTER — Ambulatory Visit: Payer: Commercial Managed Care - HMO | Attending: Pain Medicine | Admitting: Pain Medicine

## 2016-02-11 ENCOUNTER — Encounter: Payer: Self-pay | Admitting: Pain Medicine

## 2016-02-11 VITALS — BP 137/70 | HR 79 | Temp 98.3°F | Resp 19 | Ht 72.0 in | Wt 295.0 lb

## 2016-02-11 DIAGNOSIS — R700 Elevated erythrocyte sedimentation rate: Secondary | ICD-10-CM | POA: Diagnosis not present

## 2016-02-11 DIAGNOSIS — M79606 Pain in leg, unspecified: Secondary | ICD-10-CM

## 2016-02-11 DIAGNOSIS — M79642 Pain in left hand: Secondary | ICD-10-CM | POA: Diagnosis not present

## 2016-02-11 DIAGNOSIS — Z7901 Long term (current) use of anticoagulants: Secondary | ICD-10-CM | POA: Insufficient documentation

## 2016-02-11 DIAGNOSIS — M792 Neuralgia and neuritis, unspecified: Secondary | ICD-10-CM

## 2016-02-11 DIAGNOSIS — M79641 Pain in right hand: Secondary | ICD-10-CM | POA: Insufficient documentation

## 2016-02-11 DIAGNOSIS — F17200 Nicotine dependence, unspecified, uncomplicated: Secondary | ICD-10-CM | POA: Diagnosis not present

## 2016-02-11 DIAGNOSIS — G8929 Other chronic pain: Secondary | ICD-10-CM

## 2016-02-11 DIAGNOSIS — M545 Low back pain: Secondary | ICD-10-CM

## 2016-02-11 DIAGNOSIS — R51 Headache: Secondary | ICD-10-CM | POA: Insufficient documentation

## 2016-02-11 DIAGNOSIS — M4806 Spinal stenosis, lumbar region: Secondary | ICD-10-CM | POA: Insufficient documentation

## 2016-02-11 DIAGNOSIS — M5416 Radiculopathy, lumbar region: Secondary | ICD-10-CM | POA: Insufficient documentation

## 2016-02-11 DIAGNOSIS — M25562 Pain in left knee: Secondary | ICD-10-CM | POA: Diagnosis not present

## 2016-02-11 DIAGNOSIS — G629 Polyneuropathy, unspecified: Secondary | ICD-10-CM | POA: Insufficient documentation

## 2016-02-11 DIAGNOSIS — R7989 Other specified abnormal findings of blood chemistry: Secondary | ICD-10-CM | POA: Insufficient documentation

## 2016-02-11 DIAGNOSIS — R7982 Elevated C-reactive protein (CRP): Secondary | ICD-10-CM

## 2016-02-11 DIAGNOSIS — E785 Hyperlipidemia, unspecified: Secondary | ICD-10-CM | POA: Insufficient documentation

## 2016-02-11 DIAGNOSIS — Z86711 Personal history of pulmonary embolism: Secondary | ICD-10-CM | POA: Insufficient documentation

## 2016-02-11 DIAGNOSIS — M25561 Pain in right knee: Secondary | ICD-10-CM | POA: Insufficient documentation

## 2016-02-11 DIAGNOSIS — G4733 Obstructive sleep apnea (adult) (pediatric): Secondary | ICD-10-CM | POA: Insufficient documentation

## 2016-02-11 DIAGNOSIS — F418 Other specified anxiety disorders: Secondary | ICD-10-CM | POA: Insufficient documentation

## 2016-02-11 MED ORDER — TIZANIDINE HCL 4 MG PO CAPS: 4.0000 mg | ORAL_CAPSULE | Freq: Four times a day (QID) | ORAL | Status: DC | PRN

## 2016-02-11 MED ORDER — OXYCODONE HCL 5 MG PO TABS: 5.0000 mg | ORAL_TABLET | Freq: Three times a day (TID) | ORAL | Status: DC | PRN

## 2016-02-11 MED ORDER — GABAPENTIN 300 MG PO CAPS: 600.0000 mg | ORAL_CAPSULE | Freq: Four times a day (QID) | ORAL | Status: DC

## 2016-02-11 NOTE — Patient Instructions (Addendum)
Smoking Cessation, Tips for Success If you are ready to quit smoking, congratulations! You have chosen to help yourself be healthier. Cigarettes bring nicotine, tar, carbon monoxide, and other irritants into your body. Your lungs, heart, and blood vessels will be able to work better without these poisons. There are many different ways to quit smoking. Nicotine gum, nicotine patches, a nicotine inhaler, or nicotine nasal spray can help with physical craving. Hypnosis, support groups, and medicines help break the habit of smoking. WHAT THINGS CAN I DO TO MAKE QUITTING EASIER?  Here are some tips to help you quit for good:  Pick a date when you will quit smoking completely. Tell all of your friends and family about your plan to quit on that date.  Do not try to slowly cut down on the number of cigarettes you are smoking. Pick a quit date and quit smoking completely starting on that day.  Throw away all cigarettes.   Clean and remove all ashtrays from your home, work, and car.  On a card, write down your reasons for quitting. Carry the card with you and read it when you get the urge to smoke.  Cleanse your body of nicotine. Drink enough water and fluids to keep your urine clear or pale yellow. Do this after quitting to flush the nicotine from your body.  Learn to predict your moods. Do not let a bad situation be your excuse to have a cigarette. Some situations in your life might tempt you into wanting a cigarette.  Never have "just one" cigarette. It leads to wanting another and another. Remind yourself of your decision to quit.  Change habits associated with smoking. If you smoked while driving or when feeling stressed, try other activities to replace smoking. Stand up when drinking your coffee. Brush your teeth after eating. Sit in a different chair when you read the paper. Avoid alcohol while trying to quit, and try to drink fewer caffeinated beverages. Alcohol and caffeine may urge you to  smoke.  Avoid foods and drinks that can trigger a desire to smoke, such as sugary or spicy foods and alcohol.  Ask people who smoke not to smoke around you.  Have something planned to do right after eating or having a cup of coffee. For example, plan to take a walk or exercise.  Try a relaxation exercise to calm you down and decrease your stress. Remember, you may be tense and nervous for the first 2 weeks after you quit, but this will pass.  Find new activities to keep your hands busy. Play with a pen, coin, or rubber band. Doodle or draw things on paper.  Brush your teeth right after eating. This will help cut down on the craving for the taste of tobacco after meals. You can also try mouthwash.   Use oral substitutes in place of cigarettes. Try using lemon drops, carrots, cinnamon sticks, or chewing gum. Keep them handy so they are available when you have the urge to smoke.  When you have the urge to smoke, try deep breathing.  Designate your home as a nonsmoking area.  If you are a heavy smoker, ask your health care provider about a prescription for nicotine chewing gum. It can ease your withdrawal from nicotine.  Reward yourself. Set aside the cigarette money you save and buy yourself something nice.  Look for support from others. Join a support group or smoking cessation program. Ask someone at home or at work to help you with your plan   to quit smoking.  Always ask yourself, "Do I need this cigarette or is this just a reflex?" Tell yourself, "Today, I choose not to smoke," or "I do not want to smoke." You are reminding yourself of your decision to quit.  Do not replace cigarette smoking with electronic cigarettes (commonly called e-cigarettes). The safety of e-cigarettes is unknown, and some may contain harmful chemicals.  If you relapse, do not give up! Plan ahead and think about what you will do the next time you get the urge to smoke. HOW WILL I FEEL WHEN I QUIT SMOKING? You  may have symptoms of withdrawal because your body is used to nicotine (the addictive substance in cigarettes). You may crave cigarettes, be irritable, feel very hungry, cough often, get headaches, or have difficulty concentrating. The withdrawal symptoms are only temporary. They are strongest when you first quit but will go away within 10-14 days. When withdrawal symptoms occur, stay in control. Think about your reasons for quitting. Remind yourself that these are signs that your body is healing and getting used to being without cigarettes. Remember that withdrawal symptoms are easier to treat than the major diseases that smoking can cause.  Even after the withdrawal is over, expect periodic urges to smoke. However, these cravings are generally short lived and will go away whether you smoke or not. Do not smoke! WHAT RESOURCES ARE AVAILABLE TO HELP ME QUIT SMOKING? Your health care provider can direct you to community resources or hospitals for support, which may include:  Group support.  Education.  Hypnosis.  Therapy.   This information is not intended to replace advice given to you by your health care provider. Make sure you discuss any questions you have with your health care provider.   Document Released: 09/03/2004 Document Revised: 12/27/2014 Document Reviewed: 05/24/2013 Elsevier Interactive Patient Education 2016 [place] to go to pre admit testing for labwork.

## 2016-02-11 NOTE — Progress Notes (Signed)
Safety precautions to be maintained throughout the outpatient stay will include: orient to surroundings, keep bed in low position, maintain call bell within reach at all times, provide assistance with transfer out of bed and ambulation.  

## 2016-02-11 NOTE — Progress Notes (Signed)
Patient's Name: Adrian [name] MRN: [id number] DOB: May 18, 1946 DOS: 02/11/2016  Primary Reason(s) for Visit: Encounter for evaluation before starting a Medication CC: Leg Pain   HPI  Adrian [name] is a 70 y.o. year old, male patient, who returns today as an established patient. He has Chronic pain; Neuropathy (HCC) (lower extremity peripheral neuropathy diagnosed by EMG and PNCV done by Adrian [name]); Chronic lower extremity pain (Location of Primary Source of Pain) (Bilateral) (R>L); Chronic hand pain (Bilateral) (neuropathy); Chronic knee pain (Location of Secondary source of pain) (Bilateral) (R>L); Inflammatory pain; Neuropathic pain; Neurogenic pain; Chronic lumbar radicular pain (S1 Dermatome) (Right); Intermittent claudication ([place]); Low serum cobalamin; Long term current use of opiate analgesic; Long term prescription opiate use; Opiate use; Anxiety; Chronic headache; Major depression in remission ([place]); Acid reflux; Bleeding gastrointestinal; Benign hypertension; Blood glucose elevated; HLD (hyperlipidemia); Lumbar canal stenosis (Severe: L4-5) (Moderate: L3-4 and L5-S1) (Mild: L1-2, L2-3); Mixed simple and mucopurulent chronic bronchitis ([place]); Morbid (severe) obesity due to excess calories ([place]); Loss of feeling or sensation; Obstructive apnea; Polyneuropathy ([place]); Degenerative arthritis of hip; Pulmonary embolism ([place]); History of pulmonary embolism x 2; Chronic lumbar radiculopathy (Right); Diffuse myofascial pain syndrome; Encounter for therapeutic drug level monitoring; Encounter for chronic pain management; Encounter for pain management planning; Chronic anticoagulation (Coumadin and Plaquenil) (due to pulmonary embolism); Chronic low back pain (Location of Tertiary source of pain) (Right); Abnormal MRI, lumbar spine (02/03/2016); Lumbar spondylosis; Abnormal NCS (nerve conduction studies); Elevated sedimentation rate; and Elevated C-reactive protein (CRP) on his problem list.. His primarily concern  today is the Leg Pain   The patient returns to the clinic today after last time being seen on 02/09/2016. At that time, we have talked to him about several options based on his pathology. Based on the results of his lumbar MRI done on 02/03/2016, it is clear that he has lumbar spinal stenosis with possible impingement at several levels. In addition, his nerve conduction test would suggest that he has bilateral since early polyneuropathy affecting the lower extremities.  Clinically the patient indicates that his primary pain is in the lower extremities with the right being worst on the left. Out of the pain in both lower extremities the only one that seems to follow a radicular component is the right one where it seems to follow an S1 dermatome. For this we have offered to do a lumbar epidural steroid injection under fluoroscopic guidance starting at the L3-4 level where he has the most severe central canal stenosis. Of concern to the patient was paced history of a pulmonary embolism and the fact that he is on Coumadin and Plaquenil. However, we have contacted the patient primary care physician and requested clearance granted. However, the patient now comes in and indicates that he has talked to his wife and he has come to the conclusion that what he wants to do is treat this with pain medication.  Because of the CDC guidelines indicating that the use of opioids should be only a last resort, we have explained to the patient that we recommend the interventional therapies combined with balanced pharmacological approach. Today I took time to explain to the patient the difference between chronic pain and acute pain and how they are both treated differently. I called her what the goals of chronic pain management would be (increasing functionality and not 100% relief of the pain). I also covert the issue of opioid tolerance and how to manage it with the use of "Drug Holidays".  Today I  also took time to explain to  the patient the difference between neuropathic pain, neurogenic pain, and somatic pain. I also explained to him how opioids are not very effective in controlling neuropathic pain and how they are no studies indicating that the long-term use of opioids is effective in controlling chronic pain. I took the time to explain to the patient about the use of membranes stabilizers the neurogenic/neuropathic pain and all along the way he kept on throwing barriers indicating that he had already tried the Neurontin without any benefit (however he couldn't tell me what dose he used or what kind of regimen). He also indicated that he had tried the Lyrica but he couldn't afford it. Clearly he cannot use any nonsteroidal anti-inflammatory drugs while he is on Coumadin and Plaquenil.  I attempted to go over a long term plan on how to manage this and perhaps consider the possibility of surgical intervention to fix the problem. However, he indicated that he was told by Adrian [name] and that he could never come off of the blood thinners. This is rather interesting considering that he also has a [place] filter in place. On the patient's last visit to the clinic he also told me that he had been told that he couldn't get off of the blood thinners to do any procedures, but clearly this was not true since we did get the clearance to stop them for a short period time.  Unfortunately, I am getting a bad feeling that this patient is fixated on the opioids and he is not considering other possibilities such as interventional pain management therapies, surgeries, membrane stabilizers, or any other alternative that I have to offer him. Today I also took the time to talk to the patient about his morbid obesity and how it adversely affects his low back pain and lumbar spondylosis. The patient was given a goal to go down to a BMI of 30. This is the level below which weight tends not to affect significantly the low back pain.  Reported Pain  Score: 8 , clinically he looks like a 1-2/10. Reported level is inconsistent with clinical obrservations. Pain Type: Chronic pain Pain Location: Leg Pain Orientation: Right, Left Pain Descriptors / Indicators: Aching Pain Frequency: Constant  Date of Last Visit: 02/09/16 Service Provided on Last Visit: Evaluation  Controlled Substance Pharmacotherapy Assessment  Analgesic: The patient has a previous history of having used hydrocodone, oxycodone, and even tramadol, dating back to 2011. Monitoring: [place] PMP: The patient had initially led me to believe that he was not taking any medications for the pain. However, the pmp would suggest that his use goals back to 2011. In all honesty, this uses low, but still not exactly what he had suggested. UDS Results/interpretation: The patient's last UDS was done on 12/10/2015 and it came back completely negative. Medication Assessment Form: Reviewed. Patient indicates being compliant with therapy Treatment compliance: Compliant Risk Assessment: Substance Use Disorder (SUD) Risk Level: Low, based on the medical psychology evaluation. Opioid Risk Tool (ORT) Score: Total Score: 0 Depression Scale Score:    Pharmacologic Plan: Today we will start him on low-dose oxycodone. in addition, I have given him a prescription for gabapentin 300 mg with instructions to slowly titrate that up until comfortable. I believe that the membrane stabilizer should be the best option for his neuropathic pain. In addition to this, I have also given him a prescription for the entire tizanidine so that he can increase the dose. He already started to 2  mg 3 times a day and he indicated that it did not do anything for him. However, it also did not give him any side effects. In view of this, I have increased the dose to see if we can get better effectiveness. The patient was reminded that under no circumstances he is to drive while taking these medications. In addition to this, the patient  was informed of all the risks and possible complications associated with the use of these medications including but not limited to the toilet depression and death, hypo-testosteronism, hypogonadism, osteoporosis, decreased metabolism with weight gain, and addiction.  Lab Work: Illicit Drugs No results found for: THCU, COCAINSCRNUR, PCPSCRNUR, MDMA, AMPHETMU, METHADONE, ETOH  Inflammation Markers Lab Results  Component Value Date   ESRSEDRATE 42* 12/10/2015   CRP 1.1* 12/10/2015    Renal Function Lab Results  Component Value Date   BUN 10 12/10/2015   CREATININE 0.96 12/10/2015   GFRAA >60 12/10/2015   GFRNONAA >60 12/10/2015    Hepatic Function Lab Results  Component Value Date   AST 23 12/10/2015   ALT 20 12/10/2015   ALBUMIN 4.4 12/10/2015    Electrolytes Lab Results  Component Value Date   NA 138 12/10/2015   K 3.7 12/10/2015   CL 105 12/10/2015   CALCIUM 9.2 12/10/2015   MG 2.0 12/10/2015    Allergies  Mr. Lindahl is allergic to duloxetine; pseudoephedrine hcl; and shellfish allergy.  Meds  The patient has a current medication list which includes the following prescription(s): acetaminophen, allopurinol, benazepril, escitalopram, hydroxychloroquine, multivitamin, pantoprazole, potassium chloride, torsemide, venlafaxine xr, vitamin b-12, warfarin, gabapentin, oxycodone, and tizanidine.  Current Outpatient Prescriptions on File Prior to Visit  Medication Sig  . acetaminophen (TYLENOL) 500 MG tablet Take 1,000 mg by mouth every 6 (six) hours as needed.  Marland Kitchen allopurinol (ZYLOPRIM) 300 MG tablet Take 300 mg by mouth daily.  . benazepril (LOTENSIN) 10 MG tablet Take 10 mg by mouth daily.  Marland Kitchen escitalopram (LEXAPRO) 10 MG tablet Take 10 mg by mouth daily.  . hydroxychloroquine (PLAQUENIL) 200 MG tablet TAKE 1 TABLET (200 MG TOTAL) BY MOUTH ONCE DAILY.  . Multiple Vitamin (MULTIVITAMIN) tablet Take 1 tablet by mouth daily.  . pantoprazole (PROTONIX) 40 MG tablet Take 40 mg  by mouth daily.  . potassium chloride (K-DUR) 10 MEQ tablet Take 10 mEq by mouth daily.  Marland Kitchen torsemide (DEMADEX) 10 MG tablet Take 10 mg by mouth daily.  Marland Kitchen venlafaxine XR (EFFEXOR-XR) 75 MG 24 hr capsule TAKE 1 CAPSULE (75 MG TOTAL) BY MOUTH ONCE DAILY.  . vitamin B-12 (CYANOCOBALAMIN) 500 MCG tablet   . warfarin (COUMADIN) 6 MG tablet Take 6 mg by mouth daily.   No current facility-administered medications on file prior to visit.    ROS  Constitutional: Afebrile, no chills, well hydrated and well nourished Gastrointestinal: negative Musculoskeletal:negative Neurological: negative Behavioral/Psych: negative  PFSH  Medical:  Mr. Aumiller  has a past medical history of Anxiety; Reflux; Cancer ([place]); Arthritis; Pulmonary embolism ([place]) (05/25/2014); and Lumbar radiculopathy (09/25/2014). Family: family history includes Cancer in his father; Diabetes in his mother. Surgical:  has past surgical history that includes Hernia repair. Tobacco:  reports that he has been smoking.  He has never used smokeless tobacco. Alcohol:  reports that he drinks alcohol. Drug:  reports that he does not use illicit drugs.  Physical Exam  Vitals:  Today's Vitals   02/11/16 1334 02/11/16 1335  BP:  137/70  Pulse: 79   Temp: 98.3 F (36.8  C)   Resp: 19   Height: 6' (1.829 m)   Weight: 295 lb (133.811 kg)   SpO2: 99%   PainSc: 8  8   PainLoc: Leg     Calculated BMI: Body mass index is 40 kg/(m^2).  General appearance: alert, appears stated age, no distress and morbidly obese Eyes: PERLA Respiratory: No evidence respiratory distress, no audible rales or ronchi and no use of accessory muscles of respiration  Lumbar Spine Inspection: No gross anomalies detected Alignment: Symetrical ROM: Decreased  Gait: Normal  Lower Extremities Inspection: No gross anomalies detected ROM: Adequate Sensory:  Normal Motor: Grossly intact  Assessment & Plan  Primary Diagnosis & Pertinent Problem List: The  primary encounter diagnosis was Elevated sedimentation rate. Diagnoses of Elevated C-reactive protein (CRP), Chronic pain, Chronic pain of lower extremity, unspecified laterality, Chronic low back pain (Location of Tertiary source of pain) (Right), Neurogenic pain, and Neuropathic pain were also pertinent to this visit.  Visit Diagnosis: 1. Elevated sedimentation rate   2. Elevated C-reactive protein (CRP)   3. Chronic pain   4. Chronic pain of lower extremity, unspecified laterality   5. Chronic low back pain (Location of Tertiary source of pain) (Right)   6. Neurogenic pain   7. Neuropathic pain     Problem-specific Plan(s): No problem-specific assessment & plan notes found for this encounter.   Plan of Care  Pharmacotherapy (Medications Ordered): Meds ordered this encounter  Medications  . oxyCODONE (OXY IR/ROXICODONE) 5 MG immediate release tablet    Sig: Take 1 tablet (5 mg total) by mouth every 8 (eight) hours as needed for moderate pain or severe pain.    Dispense:  90 tablet    Refill:  0    Do not place this medication, or any other prescription from our practice, on "Automatic Refill". Patient may have prescription filled one day early if pharmacy is closed on scheduled refill date. Do not fill until: 02/11/16 To last until: 03/12/16  . tiZANidine (ZANAFLEX) 4 MG capsule    Sig: Take 1 capsule (4 mg total) by mouth every 6 (six) hours as needed for muscle spasms.    Dispense:  120 capsule    Refill:  0    Do not place this medication, or any other prescription from our practice, on "Automatic Refill". Patient may have prescription filled one day early if pharmacy is closed on scheduled refill date.  . gabapentin (NEURONTIN) 300 MG capsule    Sig: Take 2 capsules (600 mg total) by mouth 4 (four) times daily. Follow titration schedule.    Dispense:  240 capsule    Refill:  0    Do not place this medication, or any other prescription from our practice, on "Automatic  Refill". Patient may have prescription filled one day early if pharmacy is closed on scheduled refill date.    Lab-work & Procedure Ordered: Orders Placed This Encounter  Procedures  . ANA Comprehensive Panel  . HLA-B27 antigen  . Rheumatoid Arthritis Profile  . Uric Acid, Synovial Fluid    Imaging Ordered: None  Interventional Therapies: Scheduled: None by request of the patient. PRN Procedures: None by request of the patient.    Referral(s) or Consult(s): The patient was given the recommendation to consider surgery once he is over the acute period from his pulmonary embolism. I believe that to a certain extent his blood clots may be secondary to decreased levels of activity.  Medications administered during this visit: Mr. Fooks had no medications administered  during this visit.  Future Appointments Date Time Provider [place]  03/08/2016 9:20 AM Milinda Pointer, MD [facility] None    Primary Care Physician: Kirk Ruths., MD [facility] Note by: Kathlen Brunswick. Dossie Arbour, M.D, DABA, DABAPM, DABPM, DABIPP, FIPP

## 2016-02-13 DIAGNOSIS — I739 Peripheral vascular disease, unspecified: Secondary | ICD-10-CM | POA: Diagnosis not present

## 2016-02-13 DIAGNOSIS — M79661 Pain in right lower leg: Secondary | ICD-10-CM | POA: Diagnosis not present

## 2016-02-13 NOTE — Assessment & Plan Note (Signed)
Right lower extremity pain follows a dermatomal distribution to the bottom of the foot in what seems to be an S1 dermatome. In the case of the left lower extremity the pain goes down through the area of the knee and into the bottom and insight portion of the foot in what seems to be an S1 as well as an L4 dermatomal distribution.

## 2016-02-13 NOTE — Assessment & Plan Note (Signed)
In the case of the right knee the pain goes to the lateral aspect of the knee. In the case of the left knee the pain goes to the medial aspect of the knee.

## 2016-02-16 LAB — RHEUMATOID ARTHRITIS PROFILE
CCP Antibodies IgG/IgA: 7 units (ref 0–19)
Rhuematoid fact SerPl-aCnc: 10.7 IU/mL (ref 0.0–13.9)

## 2016-02-16 LAB — ANA COMPREHENSIVE PANEL
Chromatin Ab SerPl-aCnc: <0.2 AI (ref 0.0–0.9)
ENA SM Ab Ser-aCnc: <0.2 AI (ref 0.0–0.9)
Ribonucleic Protein: 0.5 AI (ref 0.0–0.9)
Scleroderma (Scl-70) (ENA) Antibody, IgG: <0.2 AI (ref 0.0–0.9)
ds DNA Ab: 1 IU/mL (ref 0–9)

## 2016-02-16 LAB — HLA-B27 ANTIGEN: HLA-B27: NEGATIVE

## 2016-02-24 ENCOUNTER — Telehealth: Payer: Self-pay | Admitting: Pain Medicine

## 2016-02-24 NOTE — Telephone Encounter (Signed)
Spoke with patient concerning scheduling the LESI ordered on the 02/09/2016 visit - Josem Kaufmann has been recvd from [facility] - patient states that he doesn't want to have procedures @ this time and he did discuss this with MD @ the 02/11/2016 visit and recvd medication to help with his pain. Patient wants MD to know that he has stopped the medication for now due to constipation and very little relief and he will discuss other options @ his next appointment on 03/08/2016

## 2016-02-27 DIAGNOSIS — M179 Osteoarthritis of knee, unspecified: Secondary | ICD-10-CM | POA: Insufficient documentation

## 2016-02-27 DIAGNOSIS — M1711 Unilateral primary osteoarthritis, right knee: Secondary | ICD-10-CM | POA: Diagnosis not present

## 2016-02-27 DIAGNOSIS — M1710 Unilateral primary osteoarthritis, unspecified knee: Secondary | ICD-10-CM | POA: Insufficient documentation

## 2016-03-02 DIAGNOSIS — R6889 Other general symptoms and signs: Secondary | ICD-10-CM | POA: Diagnosis not present

## 2016-03-08 ENCOUNTER — Encounter: Payer: Self-pay | Admitting: Pain Medicine

## 2016-03-08 ENCOUNTER — Ambulatory Visit: Payer: Commercial Managed Care - HMO | Attending: Pain Medicine | Admitting: Pain Medicine

## 2016-03-08 VITALS — BP 148/72 | HR 69 | Temp 98.7°F | Resp 16 | Wt 295.0 lb

## 2016-03-08 DIAGNOSIS — M1610 Unilateral primary osteoarthritis, unspecified hip: Secondary | ICD-10-CM | POA: Insufficient documentation

## 2016-03-08 DIAGNOSIS — J418 Mixed simple and mucopurulent chronic bronchitis: Secondary | ICD-10-CM | POA: Diagnosis not present

## 2016-03-08 DIAGNOSIS — F17200 Nicotine dependence, unspecified, uncomplicated: Secondary | ICD-10-CM | POA: Insufficient documentation

## 2016-03-08 DIAGNOSIS — M4806 Spinal stenosis, lumbar region: Secondary | ICD-10-CM | POA: Diagnosis not present

## 2016-03-08 DIAGNOSIS — G629 Polyneuropathy, unspecified: Secondary | ICD-10-CM | POA: Diagnosis not present

## 2016-03-08 DIAGNOSIS — M25562 Pain in left knee: Secondary | ICD-10-CM | POA: Diagnosis not present

## 2016-03-08 DIAGNOSIS — I10 Essential (primary) hypertension: Secondary | ICD-10-CM | POA: Diagnosis not present

## 2016-03-08 DIAGNOSIS — M160 Bilateral primary osteoarthritis of hip: Secondary | ICD-10-CM

## 2016-03-08 DIAGNOSIS — E785 Hyperlipidemia, unspecified: Secondary | ICD-10-CM | POA: Insufficient documentation

## 2016-03-08 DIAGNOSIS — K5900 Constipation, unspecified: Secondary | ICD-10-CM | POA: Diagnosis not present

## 2016-03-08 DIAGNOSIS — F419 Anxiety disorder, unspecified: Secondary | ICD-10-CM | POA: Insufficient documentation

## 2016-03-08 DIAGNOSIS — M545 Low back pain: Secondary | ICD-10-CM | POA: Insufficient documentation

## 2016-03-08 DIAGNOSIS — M47896 Other spondylosis, lumbar region: Secondary | ICD-10-CM | POA: Insufficient documentation

## 2016-03-08 DIAGNOSIS — G8929 Other chronic pain: Secondary | ICD-10-CM | POA: Insufficient documentation

## 2016-03-08 DIAGNOSIS — M79604 Pain in right leg: Secondary | ICD-10-CM | POA: Diagnosis not present

## 2016-03-08 DIAGNOSIS — M5416 Radiculopathy, lumbar region: Secondary | ICD-10-CM

## 2016-03-08 DIAGNOSIS — M79601 Pain in right arm: Secondary | ICD-10-CM

## 2016-03-08 DIAGNOSIS — K219 Gastro-esophageal reflux disease without esophagitis: Secondary | ICD-10-CM | POA: Insufficient documentation

## 2016-03-08 DIAGNOSIS — M25569 Pain in unspecified knee: Secondary | ICD-10-CM | POA: Diagnosis present

## 2016-03-08 DIAGNOSIS — I739 Peripheral vascular disease, unspecified: Secondary | ICD-10-CM

## 2016-03-08 DIAGNOSIS — M79605 Pain in left leg: Secondary | ICD-10-CM | POA: Diagnosis not present

## 2016-03-08 DIAGNOSIS — Z79891 Long term (current) use of opiate analgesic: Secondary | ICD-10-CM | POA: Insufficient documentation

## 2016-03-08 DIAGNOSIS — R51 Headache: Secondary | ICD-10-CM | POA: Diagnosis not present

## 2016-03-08 DIAGNOSIS — M79642 Pain in left hand: Secondary | ICD-10-CM | POA: Diagnosis not present

## 2016-03-08 DIAGNOSIS — M79641 Pain in right hand: Secondary | ICD-10-CM | POA: Insufficient documentation

## 2016-03-08 DIAGNOSIS — G4733 Obstructive sleep apnea (adult) (pediatric): Secondary | ICD-10-CM | POA: Insufficient documentation

## 2016-03-08 DIAGNOSIS — M25561 Pain in right knee: Secondary | ICD-10-CM | POA: Diagnosis not present

## 2016-03-08 DIAGNOSIS — Z86711 Personal history of pulmonary embolism: Secondary | ICD-10-CM | POA: Diagnosis not present

## 2016-03-08 NOTE — Patient Instructions (Signed)
Pain Management Discharge Instructions  General Discharge Instructions :  If you need to reach your doctor call: Monday-Friday 8:00 am - 4:00 pm at [phone] or toll free [phone].  After clinic hours [phone] to have operator reach doctor.  Bring all of your medication bottles to all your appointments in the pain clinic.  To cancel or reschedule your appointment with Pain Management please remember to call 24 hours in advance to avoid a fee.  Refer to the educational materials which you have been given on: General Risks, I had my Procedure. Discharge Instructions, Post Sedation.  Post Procedure Instructions:  The drugs you were given will stay in your system until tomorrow, so for the next 24 hours you should not drive, make any legal decisions or drink any alcoholic beverages.  You may eat anything you prefer, but it is better to start with liquids then soups and crackers, and gradually work up to solid foods.  Please notify your doctor immediately if you have any unusual bleeding, trouble breathing or pain that is not related to your normal pain.  Depending on the type of procedure that was done, some parts of your body may feel week and/or numb.  This usually clears up by tonight or the next day.  Walk with the use of an assistive device or accompanied by an adult for the 24 hours.  You may use ice on the affected area for the first 24 hours.  Put ice in a Ziploc bag and cover with a towel and place against area 15 minutes on 15 minutes off.  You may switch to heat after 24 hours.Epidural Steroid Injection An epidural steroid injection is given to relieve pain in your neck, back, or legs that is caused by the irritation or swelling of a nerve root. This procedure involves injecting a steroid and numbing medicine (anesthetic) into the epidural space. The epidural space is the space between the outer covering of your spinal cord and the bones that form your backbone  (vertebra).  LET YOUR HEALTH CARE PROVIDER KNOW ABOUT:   Any allergies you have.  All medicines you are taking, including vitamins, herbs, eye drops, creams, and over-the-counter medicines such as aspirin.  Previous problems you or members of your family have had with the use of anesthetics.  Any blood disorders or blood clotting disorders you have.  Previous surgeries you have had.  Medical conditions you have. RISKS AND COMPLICATIONS Generally, this is a safe procedure. However, as with any procedure, complications can occur. Possible complications of epidural steroid injection include:  Headache.  Bleeding.  Infection.  Allergic reaction to the medicines.  Damage to your nerves. The response to this procedure depends on the underlying cause of the pain and its duration. People who have long-term (chronic) pain are less likely to benefit from epidural steroids than are those people whose pain comes on strong and suddenly. BEFORE THE PROCEDURE   Ask your health care provider about changing or stopping your regular medicines. You may be advised to stop taking blood-thinning medicines a few days before the procedure.  You may be given medicines to reduce anxiety.  Arrange for someone to take you home after the procedure. PROCEDURE   You will remain awake during the procedure. You may receive medicine to make you relaxed.  You will be asked to lie on your stomach.  The injection site will be cleaned.  The injection site will be numbed with a medicine (local anesthetic).  A needle will be   injected through your skin into the epidural space.  Your health care provider will use an X-ray machine to ensure that the steroid is delivered closest to the affected nerve. You may have minimal discomfort at this time.  Once the needle is in the right position, the local anesthetic and the steroid will be injected into the epidural space.  The needle will then be removed and a  bandage will be applied to the injection site. AFTER THE PROCEDURE   You may be monitored for a short time before you go home.  You may feel weakness or numbness in your arm or leg, which disappears within hours.  You may be allowed to eat, drink, and take your regular medicine.  You may have soreness at the site of the injection.   This information is not intended to replace advice given to you by your health care provider. Make sure you discuss any questions you have with your health care provider.   Document Released: 03/14/2008 Document Revised: 08/08/2013 Document Reviewed: 05/25/2013 [employer] Interactive Patient Education 2016 [employer] GENERAL RISKS AND COMPLICATIONS  What are the risk, side effects and possible complications? Generally speaking, most procedures are safe.  However, with any procedure there are risks, side effects, and the possibility of complications.  The risks and complications are dependent upon the sites that are lesioned, or the type of nerve block to be performed.  The closer the procedure is to the spine, the more serious the risks are.  Great care is taken when placing the radio frequency needles, block needles or lesioning probes, but sometimes complications can occur.  Infection: Any time there is an injection through the skin, there is a risk of infection.  This is why sterile conditions are used for these blocks.  There are four possible types of infection.  Localized skin infection.  Central Nervous System Infection-This can be in the form of Meningitis, which can be deadly.  Epidural Infections-This can be in the form of an epidural abscess, which can cause pressure inside of the spine, causing compression of the spinal cord with subsequent paralysis. This would require an emergency surgery to decompress, and there are no guarantees that the patient would recover from the paralysis.  Discitis-This is an infection of the intervertebral discs.  It  occurs in about 1% of discography procedures.  It is difficult to treat and it may lead to surgery.        2. Pain: the needles have to go through skin and soft tissues, will cause soreness.       3. Damage to internal structures:  The nerves to be lesioned may be near blood vessels or    other nerves which can be potentially damaged.       4. Bleeding: Bleeding is more common if the patient is taking blood thinners such as  aspirin, Coumadin, Ticiid, Plavix, etc., or if he/she have some genetic predisposition  such as hemophilia. Bleeding into the spinal canal can cause compression of the spinal  cord with subsequent paralysis.  This would require an emergency surgery to  decompress and there are no guarantees that the patient would recover from the  paralysis.       5. Pneumothorax:  Puncturing of a lung is a possibility, every time a needle is introduced in  the area of the chest or upper back.  Pneumothorax refers to free air around the  collapsed lung(s), inside of the thoracic cavity (chest cavity).  Another two   possible  complications related to a similar event would include: Hemothorax and Chylothorax.   These are variations of the Pneumothorax, where instead of air around the collapsed  lung(s), you may have blood or chyle, respectively.       6. Spinal headaches: They may occur with any procedures in the area of the spine.       7. Persistent CSF (Cerebro-Spinal Fluid) leakage: This is a rare problem, but may occur  with prolonged intrathecal or epidural catheters either due to the formation of a fistulous  track or a dural tear.       8. Nerve damage: By working so close to the spinal cord, there is always a possibility of  nerve damage, which could be as serious as a permanent spinal cord injury with  paralysis.       9. Death:  Although rare, severe deadly allergic reactions known as "Anaphylactic  reaction" can occur to any of the medications used.      10. Worsening of the symptoms:  We can  always make thing worse.  What are the chances of something like this happening? Chances of any of this occuring are extremely low.  By statistics, you have more of a chance of getting killed in a motor vehicle accident: while driving to the hospital than any of the above occurring .  Nevertheless, you should be aware that they are possibilities.  In general, it is similar to taking a shower.  Everybody knows that you can slip, hit your head and get killed.  Does that mean that you should not shower again?  Nevertheless always keep in mind that statistics do not mean anything if you happen to be on the wrong side of them.  Even if a procedure has a 1 (one) in a 1,000,000 (million) chance of going wrong, it you happen to be that one..Also, keep in mind that by statistics, you have more of a chance of having something go wrong when taking medications.  Who should not have this procedure? If you are on a blood thinning medication (e.g. Coumadin, Plavix, see list of "Blood Thinners"), or if you have an active infection going on, you should not have the procedure.  If you are taking any blood thinners, please inform your physician.  How should I prepare for this procedure?  Do not eat or drink anything at least six hours prior to the procedure.  Bring a driver with you .  It cannot be a taxi.  Come accompanied by an adult that can drive you back, and that is strong enough to help you if your legs get weak or numb from the local anesthetic.  Take all of your medicines the morning of the procedure with just enough water to swallow them.  If you have diabetes, make sure that you are scheduled to have your procedure done first thing in the morning, whenever possible.  If you have diabetes, take only half of your insulin dose and notify our nurse that you have done so as soon as you arrive at the clinic.  If you are diabetic, but only take blood sugar pills (oral hypoglycemic), then do not take them on  the morning of your procedure.  You may take them after you have had the procedure.  Do not take aspirin or any aspirin-containing medications, at least eleven (11) days prior to the procedure.  They may prolong bleeding.  Wear loose fitting clothing that may be easy to take off   and that you would not mind if it got stained with Betadine or blood.  Do not wear any jewelry or perfume  Remove any nail coloring.  It will interfere with some of our monitoring equipment.  NOTE: Remember that this is not meant to be interpreted as a complete list of all possible complications.  Unforeseen problems may occur.  BLOOD THINNERS The following drugs contain aspirin or other products, which can cause increased bleeding during surgery and should not be taken for 2 weeks prior to and 1 week after surgery.  If you should need take something for relief of minor pain, you may take acetaminophen which is found in Tylenol,m Datril, Anacin-3 and Panadol. It is not blood thinner. The products listed below are.  Do not take any of the products listed below in addition to any listed on your instruction sheet.  A.P.C or A.P.C with Codeine Codeine Phosphate Capsules #3 Ibuprofen Ridaura  ABC compound Congesprin Imuran rimadil  Advil Cope Indocin Robaxisal  Alka-Seltzer Effervescent Pain Reliever and Antacid Coricidin or Coricidin-D  Indomethacin Rufen  Alka-Seltzer plus Cold Medicine Cosprin Ketoprofen S-A-C Tablets  Anacin Analgesic Tablets or Capsules Coumadin Korlgesic Salflex  Anacin Extra Strength Analgesic tablets or capsules CP-2 Tablets Lanoril Salicylate  Anaprox Cuprimine Capsules Levenox Salocol  Anexsia-D Dalteparin Magan Salsalate  Anodynos Darvon compound Magnesium Salicylate Sine-off  Ansaid Dasin Capsules Magsal Sodium Salicylate  Anturane Depen Capsules Marnal Soma  APF Arthritis pain formula Dewitt's Pills Measurin Stanback  Argesic Dia-Gesic Meclofenamic Sulfinpyrazone  Arthritis Bayer Timed  Release Aspirin Diclofenac Meclomen Sulindac  Arthritis pain formula Anacin Dicumarol Medipren Supac  Analgesic (Safety coated) Arthralgen Diffunasal Mefanamic Suprofen  Arthritis Strength Bufferin Dihydrocodeine Mepro Compound Suprol  Arthropan liquid Dopirydamole Methcarbomol with Aspirin Synalgos  ASA tablets/Enseals Disalcid Micrainin Tagament  Ascriptin Doan's Midol Talwin  Ascriptin A/D Dolene Mobidin Tanderil  Ascriptin Extra Strength Dolobid Moblgesic Ticlid  Ascriptin with Codeine Doloprin or Doloprin with Codeine Momentum Tolectin  Asperbuf Duoprin Mono-gesic Trendar  Aspergum Duradyne Motrin or Motrin IB Triminicin  Aspirin plain, buffered or enteric coated Durasal Myochrisine Trigesic  Aspirin Suppositories Easprin Nalfon Trillsate  Aspirin with Codeine Ecotrin Regular or Extra Strength Naprosyn Uracel  Atromid-S Efficin Naproxen Ursinus  Auranofin Capsules Elmiron Neocylate Vanquish  Axotal Emagrin Norgesic Verin  Azathioprine Empirin or Empirin with Codeine Normiflo Vitamin E  Azolid Emprazil Nuprin Voltaren  Bayer Aspirin plain, buffered or children's or timed BC Tablets or powders Encaprin Orgaran Warfarin Sodium  Buff-a-Comp Enoxaparin Orudis Zorpin  Buff-a-Comp with Codeine Equegesic Os-Cal-Gesic   Buffaprin Excedrin plain, buffered or Extra Strength Oxalid   Bufferin Arthritis Strength Feldene Oxphenbutazone   Bufferin plain or Extra Strength Feldene Capsules Oxycodone with Aspirin   Bufferin with Codeine Fenoprofen Fenoprofen Pabalate or Pabalate-SF   Buffets II Flogesic Panagesic   Buffinol plain or Extra Strength Florinal or Florinal with Codeine Panwarfarin   Buf-Tabs Flurbiprofen Penicillamine   Butalbital Compound Four-way cold tablets Penicillin   Butazolidin Fragmin Pepto-Bismol   Carbenicillin Geminisyn Percodan   Carna Arthritis Reliever Geopen Persantine   Carprofen Gold's salt Persistin   Chloramphenicol Goody's Phenylbutazone   Chloromycetin  [place] Piroxlcam   Clmetidine heparin Plaquenil   Cllnoril Hyco-pap Ponstel   Clofibrate Hydroxy chloroquine Propoxyphen         Before stopping any of these medications, be sure to consult the physician who ordered them.  Some, such as Coumadin (Warfarin) are ordered to prevent or treat serious conditions such as "deep thrombosis", "pumonary embolisms", and   other heart problems.  The amount of time that you may need off of the medication may also vary with the medication and the reason for which you were taking it.  If you are taking any of these medications, please make sure you notify your pain physician before you undergo any procedures.          

## 2016-03-08 NOTE — Progress Notes (Signed)
Patient's Name: Adrian [name] MRN: [id number] DOB: [birth year] DOS: 03/08/2016  Primary Reason(s) for Visit: Encounter for Medication Management CC: Knee Pain   HPI  Adrian [name] is a 70 y.o. year old, male patient, who returns today as an established patient. He has Chronic pain; Neuropathy (HCC) (lower extremity peripheral neuropathy diagnosed by EMG and PNCV done by Dr. Melrose Nakayama); Chronic lower extremity pain (Location of Primary Source of Pain) (Bilateral) (R>L); Chronic hand pain (Bilateral) (neuropathy); Chronic knee pain (Location of Secondary source of pain) (Bilateral) (R>L); Inflammatory pain; Neuropathic pain; Neurogenic pain; Chronic lumbar radicular pain (S1 Dermatome) (Right); Intermittent claudication ([place]); Low serum cobalamin; Long term current use of opiate analgesic; Long term prescription opiate use; Opiate use; Anxiety; Chronic headache; Major depression in remission ([place]); Acid reflux; Bleeding gastrointestinal; Benign hypertension; Blood glucose elevated; HLD (hyperlipidemia); Lumbar canal stenosis (Severe: L4-5) (Moderate: L3-4 and L5-S1) (Mild: L1-2, L2-3); Mixed simple and mucopurulent chronic bronchitis ([place]); Morbid (severe) obesity due to excess calories ([place]); Loss of feeling or sensation; Obstructive apnea; Polyneuropathy ([place]); Degenerative arthritis of hip; Pulmonary embolism ([place]); History of pulmonary embolism x 2; Chronic lumbar radiculopathy (Right); Diffuse myofascial pain syndrome; Encounter for therapeutic drug level monitoring; Encounter for chronic pain management; Encounter for pain management planning; Chronic anticoagulation (Coumadin and Plaquenil) (due to pulmonary embolism); Chronic low back pain (Location of Tertiary source of pain) (Right); Abnormal MRI, lumbar spine (02/03/2016); Lumbar spondylosis; Abnormal NCS (nerve conduction studies); Elevated sedimentation rate; Elevated C-reactive protein (CRP); and Arthritis of knee, degenerative on his problem list.. His  primarily concern today is the Knee Pain   The patient returns to the clinic today for follow-up evaluation. He has stopped the oxycodone secondary to severe constipation. He indicates that his primary pain is that of the knees. His MRI demonstrated impingement at several levels secondary to short pedicles and spinal stenosis. I will be bringing him back for lumbar epidural steroid injection under fluoroscopic guidance. The patient indicates that he has tried the Lyrica and the gabapentin. The parenting did not seem to be effective. The Lyrica was too expensive for his budget. And now the oxycodone also severe constipation. Will try to assist him with interventional techniques.   Pain Assessment: Self-Reported Pain Score: 6 , clinically he looks like a 1-2/10. Reported level is compatible with observation Pain Type: Chronic pain Pain Location: Knee Pain Orientation: Left, Right Pain Descriptors / Indicators: Aching, Throbbing, Radiating ("hurts when I walk") Pain Frequency: Constant  Date of Last Visit: 02/11/16 Service Provided on Last Visit: Med Refill  Controlled Substance Pharmacotherapy Assessment  Analgesic: The patient stopped the oxycodone secondary to severe constipation. MME/day: Nonapplicable. Pharmacokinetics: Onset of action (Liberation/Absorption): Within expected pharmacological parameters Time to Peak effect (Distribution): Timing and results are as within normal expected parameters Duration of action (Metabolism/Excretion): Within normal limits for medication Pharmacodynamics: Analgesic Effect: More than 50% Activity Facilitation: Medication(s) allow patient to sit, stand, walk, and do the basic ADLs Perceived Effectiveness: Described as relatively effective, allowing for increase in activities of daily living (ADL) Side-effects or Adverse reactions: Severe constipation. Monitoring:  PMP: Compliant with practice rules and regulations UDS Results/interpretation: Last  UDS done on 12/10/2015 came back with no medications. At that time that was read as normal since he was not really taking anything. Medication Assessment Form: Reviewed. Patient indicates being compliant with therapy Treatment compliance: Compliant Risk Assessment: Aberrant Behavior: None observed today Substance Use Disorder (SUD) Risk Level: Low Opioid Risk Tool (ORT) Score: Total Score: 0 Low Risk  for SUD (Score <3) Depression Scale Score: PHQ-2: PHQ-2 Total Score: 0 No depression (0) PHQ-9: PHQ-9 Total Score: 0 No depression (0-4)  Pharmacologic Plan: No change in therapy, at this time   Laboratory Workup  Last ED UDS: No results found for: THCU, COCAINSCRNUR, PCPSCRNUR, MDMA, AMPHETMU, METHADONE, ETOH  Inflammation Markers Lab Results  Component Value Date   ESRSEDRATE 42* 12/10/2015   CRP 1.1* 12/10/2015    Renal Function Lab Results  Component Value Date   BUN 10 12/10/2015   CREATININE 0.96 12/10/2015   GFRAA >60 12/10/2015   GFRNONAA >60 12/10/2015    Hepatic Function Lab Results  Component Value Date   AST 23 12/10/2015   ALT 20 12/10/2015   ALBUMIN 4.4 12/10/2015    Electrolytes Lab Results  Component Value Date   NA 138 12/10/2015   K 3.7 12/10/2015   CL 105 12/10/2015   CALCIUM 9.2 12/10/2015   MG 2.0 12/10/2015    Allergies  Adrian [name] is allergic to duloxetine; pseudoephedrine hcl; and shellfish allergy.  Meds  The patient has a current medication list which includes the following prescription(s): acetaminophen, allopurinol, benazepril, escitalopram, gabapentin, multivitamin, pantoprazole, potassium chloride, torsemide, venlafaxine xr, vitamin b-12, and warfarin.  Current Outpatient Prescriptions on File Prior to Visit  Medication Sig  . acetaminophen (TYLENOL) 500 MG tablet Take 1,000 mg by mouth every 6 (six) hours as needed.  Marland Kitchen allopurinol (ZYLOPRIM) 300 MG tablet Take 300 mg by mouth daily.  . benazepril (LOTENSIN) 10 MG tablet Take 10 mg  by mouth daily. Reported on 03/08/2016  . escitalopram (LEXAPRO) 10 MG tablet Take 10 mg by mouth daily.  Marland Kitchen gabapentin (NEURONTIN) 300 MG capsule Take 2 capsules (600 mg total) by mouth 4 (four) times daily. Follow titration schedule.  . Multiple Vitamin (MULTIVITAMIN) tablet Take 1 tablet by mouth daily.  . pantoprazole (PROTONIX) 40 MG tablet Take 40 mg by mouth daily.  . potassium chloride (K-DUR) 10 MEQ tablet Take 10 mEq by mouth daily.  Marland Kitchen torsemide (DEMADEX) 10 MG tablet Take 10 mg by mouth daily.  Marland Kitchen venlafaxine XR (EFFEXOR-XR) 75 MG 24 hr capsule TAKE 1 CAPSULE (75 MG TOTAL) BY MOUTH ONCE DAILY.  . vitamin B-12 (CYANOCOBALAMIN) 500 MCG tablet 1,000 mcg daily.   Marland Kitchen warfarin (COUMADIN) 6 MG tablet Take 7 mg by mouth daily.    No current facility-administered medications on file prior to visit.    ROS  Constitutional: Afebrile, no chills, well hydrated and well nourished Gastrointestinal: negative Musculoskeletal:negative Neurological: negative Behavioral/Psych: negative  PFSH  Medical:  Adrian [name]  has a past medical history of Anxiety; Reflux; Cancer ([place]); Arthritis; Pulmonary embolism ([place]) (05/25/2014); and Lumbar radiculopathy (09/25/2014). Family: family history includes Cancer in his father; Diabetes in his mother. Surgical:  has past surgical history that includes Hernia repair. Tobacco:  reports that he has been smoking.  He has never used smokeless tobacco. Alcohol:  reports that he drinks alcohol. Drug:  reports that he does not use illicit drugs.  Physical Exam  Vitals:  Today's Vitals   03/08/16 0911 03/08/16 0912  BP: 148/72   Pulse: 69   Temp: 98.7 F (37.1 C)   TempSrc: Oral   Resp: 16   Weight: 295 lb (133.811 kg)   SpO2: 99%   PainSc: 6  6   PainLoc: Knee     Calculated BMI: Body mass index is 40 kg/(m^2).     General appearance: alert, cooperative, appears stated age and no distress Eyes:  PERLA Respiratory: No evidence respiratory distress, no  audible rales or ronchi and no use of accessory muscles of respiration  Lumbar Spine Exam  Inspection: No gross anomalies detected Alignment: Symetrical Palpation: Tender ROM:  Flexion: Decreased ROM Extension: Decreased ROM Lateral Bending: Decreased ROM Rotation: Decreased ROM Provocative Tests:  Lumbar Hyperextension and rotation test:  Positive for facet pain, bilaterally Patrick's Maneuver: Positive for hip joint pain, right  Gait Evaluation  Gait: Antalgic (limping)  Lower Extremity Exam  Inspection: No gross anomalies detected ROM: Adequate Sensory:  Normal Motor: Unremarkable  Toe walk (S1): WNL  Heal walk (L5): WNL  Assessment & Plan  Primary Diagnosis & Pertinent Problem List: The primary encounter diagnosis was Chronic pain of lower extremity, right. A diagnosis of Chronic lumbar radicular pain (S1 Dermatome) (Right) was also pertinent to this visit.  Visit Diagnosis: 1. Chronic pain of lower extremity, right   2. Chronic lumbar radicular pain (S1 Dermatome) (Right)     Problem-specific Plan(s): No problem-specific assessment & plan notes found for this encounter.   Plan of Care  Pharmacotherapy (Medications Ordered): No orders of the defined types were placed in this encounter.    Lab-work & Procedure Ordered: Orders Placed This Encounter  Procedures  . LUMBAR EPIDURAL STEROID INJECTION    Standing Status: Future     Number of Occurrences:      Standing Expiration Date: 03/08/2017    Scheduling Instructions:     Side: Right-sided     Sedation: No Sedation.     Timeframe: Patient has to stop the Coumadin for at least 5 days prior to procedure.    Order Specific Question:  Where will this procedure be performed?    Answer:  [facility]    Imaging Ordered: None  Interventional Therapies: Scheduled: Stop the Coumadin 5 days. Bring patient back for diagnostic L4-5 right-sided lumbar epidural steroid injection under fluoroscopic guidance,  no sedation. PRN Procedures: Diagnostic lumbar facet block under fluoroscopic guidance and IV sedation.    Referral(s) or Consult(s): None at this time.  Medications administered during this visit: Adrian [name] had no medications administered during this visit.  No future appointments.  Primary Care Physician: Kirk Ruths., MD Location: [facility] Facility Note by: Kathlen Brunswick. Dossie Arbour, M.D, DABA, DABAPM, DABPM, DABIPP, FIPP  Pain Score Disclaimer: We use the NRS-11 scale. This is a self-reported, subjective measurement of pain severity with only modest accuracy. It is used primarily to identify changes within a particular patient. It must be understood that outpatient pain scales are significantly less accurate that those used for research, where they can be applied under ideal controlled circumstances with minimal exposure to variables. In reality, the score is likely to be a combination of pain intensity and pain affect, where pain affect describes the degree of emotional arousal or changes in action readiness caused by the sensory experience of pain. Factors such as social and work situation, setting, emotional state, anxiety levels, expectation, and prior pain experience may influence pain perception and show large inter-individual differences that may also be affected by time variables.

## 2016-03-08 NOTE — Progress Notes (Signed)
Safety precautions to be maintained throughout the outpatient stay will include: orient to surroundings, keep bed in low position, maintain call bell within reach at all times, provide assistance with transfer out of bed and ambulation.  Patient did not bring medications into the clinic.

## 2016-03-09 DIAGNOSIS — Z7901 Long term (current) use of anticoagulants: Secondary | ICD-10-CM | POA: Diagnosis not present

## 2016-03-25 DIAGNOSIS — H25813 Combined forms of age-related cataract, bilateral: Secondary | ICD-10-CM | POA: Diagnosis not present

## 2016-03-25 DIAGNOSIS — H5210 Myopia, unspecified eye: Secondary | ICD-10-CM | POA: Diagnosis not present

## 2016-03-25 DIAGNOSIS — H524 Presbyopia: Secondary | ICD-10-CM | POA: Diagnosis not present

## 2016-03-26 DIAGNOSIS — G629 Polyneuropathy, unspecified: Secondary | ICD-10-CM | POA: Diagnosis not present

## 2016-03-26 DIAGNOSIS — R7302 Impaired glucose tolerance (oral): Secondary | ICD-10-CM | POA: Diagnosis not present

## 2016-03-26 DIAGNOSIS — Z7901 Long term (current) use of anticoagulants: Secondary | ICD-10-CM | POA: Diagnosis not present

## 2016-03-26 DIAGNOSIS — E7800 Pure hypercholesterolemia, unspecified: Secondary | ICD-10-CM | POA: Diagnosis not present

## 2016-03-26 DIAGNOSIS — K219 Gastro-esophageal reflux disease without esophagitis: Secondary | ICD-10-CM | POA: Diagnosis not present

## 2016-03-26 DIAGNOSIS — I739 Peripheral vascular disease, unspecified: Secondary | ICD-10-CM | POA: Diagnosis not present

## 2016-03-26 DIAGNOSIS — E538 Deficiency of other specified B group vitamins: Secondary | ICD-10-CM | POA: Diagnosis not present

## 2016-03-26 DIAGNOSIS — I10 Essential (primary) hypertension: Secondary | ICD-10-CM | POA: Diagnosis not present

## 2016-04-06 DIAGNOSIS — Z0100 Encounter for examination of eyes and vision without abnormal findings: Secondary | ICD-10-CM | POA: Diagnosis not present

## 2016-04-08 DIAGNOSIS — G5792 Unspecified mononeuropathy of left lower limb: Secondary | ICD-10-CM | POA: Diagnosis not present

## 2016-04-08 DIAGNOSIS — M1A00X0 Idiopathic chronic gout, unspecified site, without tophus (tophi): Secondary | ICD-10-CM | POA: Diagnosis not present

## 2016-04-08 DIAGNOSIS — R7302 Impaired glucose tolerance (oral): Secondary | ICD-10-CM | POA: Diagnosis not present

## 2016-04-08 DIAGNOSIS — I10 Essential (primary) hypertension: Secondary | ICD-10-CM | POA: Diagnosis not present

## 2016-04-08 DIAGNOSIS — M1990 Unspecified osteoarthritis, unspecified site: Secondary | ICD-10-CM | POA: Diagnosis not present

## 2016-04-08 DIAGNOSIS — E7800 Pure hypercholesterolemia, unspecified: Secondary | ICD-10-CM | POA: Diagnosis not present

## 2016-04-08 DIAGNOSIS — Z7901 Long term (current) use of anticoagulants: Secondary | ICD-10-CM | POA: Diagnosis not present

## 2016-04-08 DIAGNOSIS — G5791 Unspecified mononeuropathy of right lower limb: Secondary | ICD-10-CM | POA: Diagnosis not present

## 2016-04-08 DIAGNOSIS — M791 Myalgia: Secondary | ICD-10-CM | POA: Diagnosis not present

## 2016-04-08 DIAGNOSIS — E538 Deficiency of other specified B group vitamins: Secondary | ICD-10-CM | POA: Diagnosis not present

## 2016-04-29 DIAGNOSIS — G629 Polyneuropathy, unspecified: Secondary | ICD-10-CM | POA: Diagnosis not present

## 2016-04-29 DIAGNOSIS — M150 Primary generalized (osteo)arthritis: Secondary | ICD-10-CM | POA: Diagnosis not present

## 2016-04-29 DIAGNOSIS — M5431 Sciatica, right side: Secondary | ICD-10-CM | POA: Diagnosis not present

## 2016-05-07 DIAGNOSIS — Z7901 Long term (current) use of anticoagulants: Secondary | ICD-10-CM | POA: Diagnosis not present

## 2016-05-19 ENCOUNTER — Other Ambulatory Visit: Payer: Self-pay | Admitting: Family Medicine

## 2016-05-19 DIAGNOSIS — R1033 Periumbilical pain: Secondary | ICD-10-CM

## 2016-05-19 DIAGNOSIS — R1909 Other intra-abdominal and pelvic swelling, mass and lump: Secondary | ICD-10-CM

## 2016-05-24 DIAGNOSIS — M1611 Unilateral primary osteoarthritis, right hip: Secondary | ICD-10-CM | POA: Diagnosis not present

## 2016-05-24 DIAGNOSIS — R1030 Lower abdominal pain, unspecified: Secondary | ICD-10-CM | POA: Diagnosis not present

## 2016-05-24 DIAGNOSIS — M545 Low back pain: Secondary | ICD-10-CM | POA: Diagnosis not present

## 2016-05-24 DIAGNOSIS — G8929 Other chronic pain: Secondary | ICD-10-CM | POA: Diagnosis not present

## 2016-05-26 ENCOUNTER — Ambulatory Visit
Admission: RE | Admit: 2016-05-26 | Discharge: 2016-05-26 | Disposition: A | Discharge status: Home / Self Care | Payer: Commercial Managed Care - HMO | Source: Ambulatory Visit | Attending: Family Medicine | Admitting: Family Medicine

## 2016-05-26 DIAGNOSIS — R1909 Other intra-abdominal and pelvic swelling, mass and lump: Secondary | ICD-10-CM

## 2016-05-26 DIAGNOSIS — R1033 Periumbilical pain: Secondary | ICD-10-CM

## 2016-05-26 DIAGNOSIS — R938 Abnormal findings on diagnostic imaging of other specified body structures: Secondary | ICD-10-CM | POA: Insufficient documentation

## 2016-06-09 DIAGNOSIS — G4733 Obstructive sleep apnea (adult) (pediatric): Secondary | ICD-10-CM | POA: Diagnosis not present

## 2016-06-09 DIAGNOSIS — Z7901 Long term (current) use of anticoagulants: Secondary | ICD-10-CM | POA: Diagnosis not present

## 2016-06-10 ENCOUNTER — Other Ambulatory Visit: Payer: Self-pay | Admitting: Unknown Physician Specialty

## 2016-06-10 DIAGNOSIS — G8929 Other chronic pain: Secondary | ICD-10-CM

## 2016-06-10 DIAGNOSIS — M5441 Lumbago with sciatica, right side: Principal | ICD-10-CM

## 2016-06-16 ENCOUNTER — Emergency Department: Payer: Commercial Managed Care - HMO

## 2016-06-16 ENCOUNTER — Emergency Department
Admission: EM | Admit: 2016-06-16 | Discharge: 2016-06-16 | Disposition: A | Discharge status: Home / Self Care | Payer: Commercial Managed Care - HMO | Attending: Emergency Medicine | Admitting: Emergency Medicine

## 2016-06-16 DIAGNOSIS — M179 Osteoarthritis of knee, unspecified: Secondary | ICD-10-CM | POA: Diagnosis not present

## 2016-06-16 DIAGNOSIS — Z79891 Long term (current) use of opiate analgesic: Secondary | ICD-10-CM | POA: Insufficient documentation

## 2016-06-16 DIAGNOSIS — I10 Essential (primary) hypertension: Secondary | ICD-10-CM | POA: Diagnosis not present

## 2016-06-16 DIAGNOSIS — Z85828 Personal history of other malignant neoplasm of skin: Secondary | ICD-10-CM | POA: Diagnosis not present

## 2016-06-16 DIAGNOSIS — F329 Major depressive disorder, single episode, unspecified: Secondary | ICD-10-CM | POA: Insufficient documentation

## 2016-06-16 DIAGNOSIS — M069 Rheumatoid arthritis, unspecified: Secondary | ICD-10-CM | POA: Diagnosis not present

## 2016-06-16 DIAGNOSIS — M160 Bilateral primary osteoarthritis of hip: Secondary | ICD-10-CM

## 2016-06-16 DIAGNOSIS — F17200 Nicotine dependence, unspecified, uncomplicated: Secondary | ICD-10-CM | POA: Insufficient documentation

## 2016-06-16 DIAGNOSIS — M25551 Pain in right hip: Secondary | ICD-10-CM | POA: Diagnosis not present

## 2016-06-16 DIAGNOSIS — Z6839 Body mass index (BMI) 39.0-39.9, adult: Secondary | ICD-10-CM | POA: Diagnosis not present

## 2016-06-16 DIAGNOSIS — Z7901 Long term (current) use of anticoagulants: Secondary | ICD-10-CM | POA: Diagnosis not present

## 2016-06-16 DIAGNOSIS — E785 Hyperlipidemia, unspecified: Secondary | ICD-10-CM | POA: Insufficient documentation

## 2016-06-16 MED ORDER — HYDROMORPHONE HCL 1 MG/ML IJ SOLN
1.0000 mg | Freq: Once | INTRAMUSCULAR | Status: AC
  Administered 2016-06-16: 1 mg via INTRAMUSCULAR
  Filled 2016-06-16: qty 1

## 2016-06-16 MED ORDER — DIAZEPAM 5 MG/ML IJ SOLN
5.0000 mg | Freq: Once | INTRAMUSCULAR | Status: AC
  Administered 2016-06-16: 5 mg via INTRAMUSCULAR
  Filled 2016-06-16: qty 2

## 2016-06-16 NOTE — Discharge Instructions (Signed)
Osteoarthritis Osteoarthritis is a disease that causes soreness and inflammation of a joint. It occurs when the cartilage at the affected joint wears down. Cartilage acts as a cushion, covering the ends of bones where they meet to form a joint. Osteoarthritis is the most common form of arthritis. It often occurs in older people. The joints affected most often by this condition include those in the:  Ends of the fingers.  Thumbs.  Neck.  Lower back.  Knees.  Hips. CAUSES  Over time, the cartilage that covers the ends of bones begins to wear away. This causes bone to rub on bone, producing pain and stiffness in the affected joints.  RISK FACTORS Certain factors can increase your chances of having osteoarthritis, including:  Older age.  Excessive body weight.  Overuse of joints.  Previous joint injury. SIGNS AND SYMPTOMS   Pain, swelling, and stiffness in the joint.  Over time, the joint may lose its normal shape.  Small deposits of bone (osteophytes) may grow on the edges of the joint.  Bits of bone or cartilage can break off and float inside the joint space. This may cause more pain and damage. DIAGNOSIS  Your health care provider will do a physical exam and ask about your symptoms. Various tests may be ordered, such as:  X-rays of the affected joint.  Blood tests to rule out other types of arthritis. Additional tests may be used to diagnose your condition. TREATMENT  Goals of treatment are to control pain and improve joint function. Treatment plans may include:  A prescribed exercise program that allows for rest and joint relief.  A weight control plan.  Pain relief techniques, such as:  Properly applied heat and cold.  Electric pulses delivered to nerve endings under the skin (transcutaneous electrical nerve stimulation [TENS]).  Massage.  Certain nutritional supplements.  Medicines to control pain, such as:  Acetaminophen.  Nonsteroidal  anti-inflammatory drugs (NSAIDs), such as naproxen.  Narcotic or central-acting agents, such as tramadol.  Corticosteroids. These can be given orally or as an injection.  Surgery to reposition the bones and relieve pain (osteotomy) or to remove loose pieces of bone and cartilage. Joint replacement may be needed in advanced states of osteoarthritis. HOME CARE INSTRUCTIONS   Take medicines only as directed by your health care provider.  Maintain a healthy weight. Follow your health care provider's instructions for weight control. This may include dietary instructions.  Exercise as directed. Your health care provider can recommend specific types of exercise. These may include:  Strengthening exercises. These are done to strengthen the muscles that support joints affected by arthritis. They can be performed with weights or with exercise bands to add resistance.  Aerobic activities. These are exercises, such as brisk walking or low-impact aerobics, that get your heart pumping.  Range-of-motion activities. These keep your joints limber.  Balance and agility exercises. These help you maintain daily living skills.  Rest your affected joints as directed by your health care provider.  Keep all follow-up visits as directed by your health care provider. SEEK MEDICAL CARE IF:   Your skin turns red.  You develop a rash in addition to your joint pain.  You have worsening joint pain.  You have a fever along with joint or muscle aches. SEEK IMMEDIATE MEDICAL CARE IF:  You have a significant loss of weight or appetite.  You have night sweats. [place] of Arthritis and Musculoskeletal and Skin Diseases: www.niams.SouthExposed.es  National Institute on  Aging: http://kim-miller.com/  American College of Rheumatology: www.rheumatology.org   This information is not intended to replace advice given to you by your health care provider. Make sure you discuss any questions you  have with your health care provider.   Document Released: 12/06/2005 Document Revised: 12/27/2014 Document Reviewed: 08/13/2013 Elsevier Interactive Patient Education 2016 [place] therapy can help ease sore, stiff, injured, and tight muscles and joints. Heat relaxes your muscles, which may help ease your pain.  RISKS AND COMPLICATIONS If you have any of the following conditions, do not use heat therapy unless your health care provider has approved:  Poor circulation.  Healing wounds or scarred skin in the area being treated.  Diabetes, heart disease, or high blood pressure.  Not being able to feel (numbness) the area being treated.  Unusual swelling of the area being treated.  Active infections.  Blood clots.  Cancer.  Inability to communicate pain. This may include young children and people who have problems with their brain function (dementia).  Pregnancy. Heat therapy should only be used on old, pre-existing, or long-lasting (chronic) injuries. Do not use heat therapy on new injuries unless directed by your health care provider. HOW TO USE HEAT THERAPY There are several different kinds of heat therapy, including:  Moist heat pack.  Warm water bath.  Hot water bottle.  Electric heating pad.  Heated gel pack.  Heated wrap.  Electric heating pad. Use the heat therapy method suggested by your health care provider. Follow your health care provider's instructions on when and how to use heat therapy. GENERAL HEAT THERAPY RECOMMENDATIONS  Do not sleep while using heat therapy. Only use heat therapy while you are awake.  Your skin may turn pink while using heat therapy. Do not use heat therapy if your skin turns red.  Do not use heat therapy if you have new pain.  High heat or long exposure to heat can cause burns. Be careful when using heat therapy to avoid burning your skin.  Do not use heat therapy on areas of your skin that are already  irritated, such as with a rash or sunburn. SEEK MEDICAL CARE IF:  You have blisters, redness, swelling, or numbness.  You have new pain.  Your pain is worse. MAKE SURE YOU:  Understand these instructions.  Will watch your condition.  Will get help right away if you are not doing well or get worse.   This information is not intended to replace advice given to you by your health care provider. Make sure you discuss any questions you have with your health care provider.   Document Released: 02/28/2012 Document Revised: 12/27/2014 Document Reviewed: 01/29/2014 Elsevier Interactive Patient Education [employer].

## 2016-06-16 NOTE — ED Provider Notes (Signed)
[facility] Emergency Department Provider Note  ____________________________________________  Time seen: Approximately 12:42 PM  I have reviewed the triage vital signs and the nursing notes.   HISTORY  Chief Complaint Groin Pain    HPI Adrian [name] is a 70 y.o. male who presents for evaluation of right hip pain. Patient states that his pain in his right hip has been going on for quite a while on schedule have an MRI on July 12 but doesn't feel he can wait that long. In addition patient states that he's got swelling in his right groin. Recently had a abdominal and groin ultrasound which demonstrated a right inguinal hernia. Is concerned about the continued hip pain.   Past Medical History  Diagnosis Date  . Anxiety   . Reflux   . Cancer ([place])     skin  . Arthritis     rheumatoid  . Pulmonary embolism ([place]) 05/25/2014    Overview:  Times 2 on anticoagulation a.  Times two.   b.  Filter placed.    Last Assessment & Plan:  Times 2 on anticoagulation a.  Times two.   b.  Filter placed. No current bleeding noted.    . Lumbar radiculopathy 09/25/2014    Patient Active Problem List   Diagnosis Date Noted  . Osteoarthritis of both hips 03/08/2016  . Arthritis of knee, degenerative 02/27/2016  . Elevated sedimentation rate 02/11/2016  . Elevated C-reactive protein (CRP) 02/11/2016  . Abnormal MRI, lumbar spine (02/03/2016) 02/10/2016  . Lumbar spondylosis 02/10/2016  . Abnormal NCS (nerve conduction studies) 02/10/2016  . Chronic anticoagulation (Coumadin and Plaquenil) (due to pulmonary embolism) 02/09/2016  . Chronic low back pain (Location of Tertiary source of pain) (Right) 02/09/2016  . Chronic pain 12/10/2015  . Neuropathy ([place]) (lower extremity peripheral neuropathy diagnosed by EMG and PNCV done by Dr. Melrose Nakayama) 12/10/2015  . Chronic lower extremity pain (Location of Primary Source of Pain) (Bilateral) (R>L) 12/10/2015  . Chronic hand pain (Bilateral)  (neuropathy) 12/10/2015  . Chronic knee pain (Location of Secondary source of pain) (Bilateral) (R>L) 12/10/2015  . Inflammatory pain 12/10/2015  . Neuropathic pain 12/10/2015  . Neurogenic pain 12/10/2015  . Chronic lumbar radicular pain (S1 Dermatome) (Right) 12/10/2015  . Long term current use of opiate analgesic 12/10/2015  . Long term prescription opiate use 12/10/2015  . Opiate use 12/10/2015  . Bleeding gastrointestinal 12/10/2015  . HLD (hyperlipidemia) 12/10/2015  . History of pulmonary embolism x 2 12/10/2015  . Chronic lumbar radiculopathy (Right) 12/10/2015  . Diffuse myofascial pain syndrome 12/10/2015  . Encounter for therapeutic drug level monitoring 12/10/2015  . Encounter for chronic pain management 12/10/2015  . Encounter for pain management planning 12/10/2015  . Mixed simple and mucopurulent chronic bronchitis ([place]) 03/27/2015  . Loss of feeling or sensation 02/11/2015  . Polyneuropathy ([place]) 02/11/2015  . Low serum cobalamin 11/11/2014  . Intermittent claudication ([place]) 09/25/2014  . Major depression in remission ([place]) 09/25/2014  . Lumbar canal stenosis (Severe: L4-5) (Moderate: L3-4 and L5-S1) (Mild: L1-2, L2-3) 09/25/2014  . Morbid (severe) obesity due to excess calories ([place]) 09/25/2014  . Anxiety 05/25/2014  . Chronic headache 05/25/2014  . Acid reflux 05/25/2014  . Benign hypertension 05/25/2014  . Blood glucose elevated 05/25/2014  . Obstructive apnea 05/25/2014  . Pulmonary embolism ([place]) 05/25/2014    Past Surgical History  Procedure Laterality Date  . Hernia repair      Current Outpatient Rx  Name  Route  Sig  Dispense  Refill  . acetaminophen (TYLENOL) 500 MG tablet   Oral   Take 1,000 mg by mouth every 6 (six) hours as needed.         Marland Kitchen allopurinol (ZYLOPRIM) 300 MG tablet   Oral   Take 300 mg by mouth daily.         . benazepril (LOTENSIN) 10 MG tablet   Oral   Take 10 mg by mouth daily. Reported on 03/08/2016         .  escitalopram (LEXAPRO) 10 MG tablet   Oral   Take 10 mg by mouth daily.         . Multiple Vitamin (MULTIVITAMIN) tablet   Oral   Take 1 tablet by mouth daily.         . pantoprazole (PROTONIX) 40 MG tablet   Oral   Take 40 mg by mouth daily.         . potassium chloride (K-DUR) 10 MEQ tablet   Oral   Take 10 mEq by mouth daily.         Marland Kitchen torsemide (DEMADEX) 10 MG tablet   Oral   Take 10 mg by mouth daily.         Marland Kitchen venlafaxine XR (EFFEXOR-XR) 75 MG 24 hr capsule      TAKE 1 CAPSULE (75 MG TOTAL) BY MOUTH ONCE DAILY.      11   . vitamin B-12 (CYANOCOBALAMIN) 500 MCG tablet      1,000 mcg daily.          Marland Kitchen warfarin (COUMADIN) 6 MG tablet   Oral   Take 7 mg by mouth daily.            Allergies Duloxetine; Pseudoephedrine hcl; and Shellfish allergy  Family History  Problem Relation Age of Onset  . Diabetes Mother   . Cancer Father     Social History Social History  Substance Use Topics  . Smoking status: Light Tobacco Smoker -- 0.00 packs/day for 54 years  . Smokeless tobacco: Never Used  . Alcohol Use: 0.0 oz/week    0 Standard drinks or equivalent per week     Comment: rare    Review of Systems Constitutional: No fever/chills Eyes: No visual changes. ENT: No sore throat. Cardiovascular: Denies chest pain. Respiratory: Denies shortness of breath. Gastrointestinal: No abdominal pain.  No nausea, no vomiting.  No diarrhea.  No constipation. Genitourinary: Negative for dysuria.Positive for right groin swelling. Musculoskeletal: Positive for right hip pain. Skin: Negative for rash. Neurological: Negative for headaches, focal weakness or numbness.  10-point ROS otherwise negative.  ____________________________________________   PHYSICAL EXAM:  VITAL SIGNS: ED Triage Vitals  Enc Vitals Group     BP 06/16/16 1157 107/54 mmHg     Pulse Rate 06/16/16 1157 68     Resp 06/16/16 1157 16     Temp 06/16/16 1157 98 F (36.7 C)     Temp  Source 06/16/16 1157 Oral     SpO2 06/16/16 1157 99 %     Weight 06/16/16 1157 290 lb (131.543 kg)     Height 06/16/16 1157 6' (1.829 m)     Head Cir --      Peak Flow --      Pain Score 06/16/16 1221 8     Pain Loc --      Pain Edu? --      Excl. in [place]? --     Constitutional: Alert and oriented. Well appearing and in no  acute distress. Cardiovascular: Normal rate, regular rhythm. Grossly normal heart sounds.  Good peripheral circulation. Respiratory: Normal respiratory effort.  No retractions. Lungs CTAB. Gastrointestinal: Soft and nontender. No distention. No abdominal bruits. No CVA tenderness. Minimal swelling noted to the right groin. Musculoskeletal: Point tenderness noted to the right lateral aspect of the hip. Neurologic:  Normal speech and language. No gross focal neurologic deficits are appreciated. No gait instability. Skin:  Skin is warm, dry and intact. No rash noted. Psychiatric: Mood and affect are normal. Speech and behavior are normal.  ____________________________________________   LABS (all labs ordered are listed, but only abnormal results are displayed)  Labs Reviewed - No data to display ____________________________________________   RADIOLOGY  FINDINGS: There is no evidence of hip fracture or dislocation. There are degenerative joint changes of bilateral hips with narrowed joint space and osteophyte formation, right greater than left. Focal sclerosis is identified in the proximal left femur are probably bone island.  IMPRESSION: Osteoarthritic changes of bilateral hips, right greater than left. ____________________________________________   PROCEDURES  Procedure(s) performed: None  Critical Care performed: No  ____________________________________________   INITIAL IMPRESSION / ASSESSMENT AND PLAN / ED COURSE  Pertinent labs & imaging results that were available during my care of the patient were reviewed by me and considered in my medical  decision making (see chart for details).  Osteoarthritis of both hips. Patient is referred back to his PCP, back to his surgeon and to pain management. He voices no other emergency medical complaints at this time. He received Dilaudid 2 mg IM and Valium 5 mg IM while in the ED. Normal improvement. ____________________________________________   FINAL CLINICAL IMPRESSION(S) / ED DIAGNOSES  Final diagnoses:  Osteoarthritis of both hips, unspecified osteoarthritis type     This chart was dictated using voice recognition software/Dragon. Despite best efforts to proofread, errors can occur which can change the meaning. Any change was purely unintentional.   Arlyss Repress, PA-C 06/16/16 [place], MD 06/16/16 [phone]

## 2016-06-16 NOTE — ED Notes (Signed)
Pt with right flank and groin pain that started 5 weeks ago. Pt saw PCP and had normal [place]. Pain 8/10. Decreased movement right leg due to pain.

## 2016-06-23 DIAGNOSIS — M1611 Unilateral primary osteoarthritis, right hip: Secondary | ICD-10-CM | POA: Diagnosis not present

## 2016-06-23 DIAGNOSIS — Z7901 Long term (current) use of anticoagulants: Secondary | ICD-10-CM | POA: Diagnosis not present

## 2016-06-30 ENCOUNTER — Ambulatory Visit
Admission: RE | Admit: 2016-06-30 | Discharge: 2016-06-30 | Disposition: A | Discharge status: Home / Self Care | Payer: Commercial Managed Care - HMO | Source: Ambulatory Visit | Attending: Unknown Physician Specialty | Admitting: Unknown Physician Specialty

## 2016-06-30 DIAGNOSIS — G8929 Other chronic pain: Secondary | ICD-10-CM | POA: Insufficient documentation

## 2016-06-30 DIAGNOSIS — M5127 Other intervertebral disc displacement, lumbosacral region: Secondary | ICD-10-CM | POA: Diagnosis not present

## 2016-06-30 DIAGNOSIS — M5441 Lumbago with sciatica, right side: Secondary | ICD-10-CM | POA: Diagnosis not present

## 2016-06-30 DIAGNOSIS — M4806 Spinal stenosis, lumbar region: Secondary | ICD-10-CM | POA: Diagnosis not present

## 2016-06-30 DIAGNOSIS — M5126 Other intervertebral disc displacement, lumbar region: Secondary | ICD-10-CM | POA: Diagnosis not present

## 2016-06-30 DIAGNOSIS — M25551 Pain in right hip: Secondary | ICD-10-CM | POA: Diagnosis not present

## 2016-07-01 DIAGNOSIS — G8929 Other chronic pain: Secondary | ICD-10-CM | POA: Diagnosis not present

## 2016-07-01 DIAGNOSIS — M5441 Lumbago with sciatica, right side: Secondary | ICD-10-CM | POA: Diagnosis not present

## 2016-07-01 DIAGNOSIS — M1611 Unilateral primary osteoarthritis, right hip: Secondary | ICD-10-CM | POA: Diagnosis not present

## 2016-07-07 DIAGNOSIS — Z7901 Long term (current) use of anticoagulants: Secondary | ICD-10-CM | POA: Diagnosis not present

## 2016-07-21 DIAGNOSIS — Z7901 Long term (current) use of anticoagulants: Secondary | ICD-10-CM | POA: Diagnosis not present

## 2016-08-02 DIAGNOSIS — R911 Solitary pulmonary nodule: Secondary | ICD-10-CM | POA: Diagnosis not present

## 2016-08-02 DIAGNOSIS — G4733 Obstructive sleep apnea (adult) (pediatric): Secondary | ICD-10-CM | POA: Diagnosis not present

## 2016-08-02 DIAGNOSIS — R05 Cough: Secondary | ICD-10-CM | POA: Diagnosis not present

## 2016-08-02 DIAGNOSIS — Z6841 Body Mass Index (BMI) 40.0 and over, adult: Secondary | ICD-10-CM | POA: Diagnosis not present

## 2016-08-02 DIAGNOSIS — Z9989 Dependence on other enabling machines and devices: Secondary | ICD-10-CM | POA: Diagnosis not present

## 2016-08-06 DIAGNOSIS — Z7901 Long term (current) use of anticoagulants: Secondary | ICD-10-CM | POA: Diagnosis not present

## 2016-08-19 DIAGNOSIS — M4806 Spinal stenosis, lumbar region: Secondary | ICD-10-CM | POA: Diagnosis not present

## 2016-08-19 DIAGNOSIS — G629 Polyneuropathy, unspecified: Secondary | ICD-10-CM | POA: Diagnosis not present

## 2016-08-19 DIAGNOSIS — M5416 Radiculopathy, lumbar region: Secondary | ICD-10-CM | POA: Diagnosis not present

## 2016-08-19 DIAGNOSIS — M150 Primary generalized (osteo)arthritis: Secondary | ICD-10-CM | POA: Diagnosis not present

## 2016-08-20 DIAGNOSIS — Z7901 Long term (current) use of anticoagulants: Secondary | ICD-10-CM | POA: Diagnosis not present

## 2016-08-23 ENCOUNTER — Emergency Department: Payer: Commercial Managed Care - HMO

## 2016-08-23 ENCOUNTER — Observation Stay
Admission: EM | Admit: 2016-08-23 | Discharge: 2016-08-24 | Disposition: A | Discharge status: Home / Self Care | Payer: Commercial Managed Care - HMO | Attending: Internal Medicine | Admitting: Internal Medicine

## 2016-08-23 ENCOUNTER — Ambulatory Visit (INDEPENDENT_AMBULATORY_CARE_PROVIDER_SITE_OTHER)
Admission: EM | Admit: 2016-08-23 | Discharge: 2016-08-23 | Disposition: A | Discharge status: Home / Self Care | Payer: Commercial Managed Care - HMO | Source: Home / Self Care | Attending: Family Medicine | Admitting: Family Medicine

## 2016-08-23 DIAGNOSIS — M069 Rheumatoid arthritis, unspecified: Secondary | ICD-10-CM | POA: Insufficient documentation

## 2016-08-23 DIAGNOSIS — R079 Chest pain, unspecified: Principal | ICD-10-CM

## 2016-08-23 DIAGNOSIS — I10 Essential (primary) hypertension: Secondary | ICD-10-CM | POA: Diagnosis not present

## 2016-08-23 DIAGNOSIS — F17200 Nicotine dependence, unspecified, uncomplicated: Secondary | ICD-10-CM | POA: Insufficient documentation

## 2016-08-23 DIAGNOSIS — R61 Generalized hyperhidrosis: Secondary | ICD-10-CM

## 2016-08-23 DIAGNOSIS — R0602 Shortness of breath: Secondary | ICD-10-CM

## 2016-08-23 DIAGNOSIS — Z85828 Personal history of other malignant neoplasm of skin: Secondary | ICD-10-CM | POA: Diagnosis not present

## 2016-08-23 DIAGNOSIS — Z86711 Personal history of pulmonary embolism: Secondary | ICD-10-CM | POA: Insufficient documentation

## 2016-08-23 DIAGNOSIS — M47814 Spondylosis without myelopathy or radiculopathy, thoracic region: Secondary | ICD-10-CM | POA: Insufficient documentation

## 2016-08-23 DIAGNOSIS — R0789 Other chest pain: Secondary | ICD-10-CM | POA: Diagnosis not present

## 2016-08-23 DIAGNOSIS — R55 Syncope and collapse: Secondary | ICD-10-CM

## 2016-08-23 DIAGNOSIS — K439 Ventral hernia without obstruction or gangrene: Secondary | ICD-10-CM | POA: Insufficient documentation

## 2016-08-23 DIAGNOSIS — G629 Polyneuropathy, unspecified: Secondary | ICD-10-CM | POA: Insufficient documentation

## 2016-08-23 DIAGNOSIS — I6523 Occlusion and stenosis of bilateral carotid arteries: Secondary | ICD-10-CM | POA: Diagnosis not present

## 2016-08-23 DIAGNOSIS — F419 Anxiety disorder, unspecified: Secondary | ICD-10-CM | POA: Insufficient documentation

## 2016-08-23 DIAGNOSIS — G894 Chronic pain syndrome: Secondary | ICD-10-CM | POA: Diagnosis not present

## 2016-08-23 DIAGNOSIS — K219 Gastro-esophageal reflux disease without esophagitis: Secondary | ICD-10-CM | POA: Insufficient documentation

## 2016-08-23 DIAGNOSIS — M5134 Other intervertebral disc degeneration, thoracic region: Secondary | ICD-10-CM | POA: Diagnosis not present

## 2016-08-23 DIAGNOSIS — I2510 Atherosclerotic heart disease of native coronary artery without angina pectoris: Secondary | ICD-10-CM | POA: Insufficient documentation

## 2016-08-23 DIAGNOSIS — R000 Tachycardia, unspecified: Secondary | ICD-10-CM | POA: Insufficient documentation

## 2016-08-23 DIAGNOSIS — M4806 Spinal stenosis, lumbar region: Secondary | ICD-10-CM | POA: Diagnosis not present

## 2016-08-23 DIAGNOSIS — Z86718 Personal history of other venous thrombosis and embolism: Secondary | ICD-10-CM | POA: Diagnosis not present

## 2016-08-23 DIAGNOSIS — E785 Hyperlipidemia, unspecified: Secondary | ICD-10-CM | POA: Diagnosis present

## 2016-08-23 DIAGNOSIS — G8929 Other chronic pain: Secondary | ICD-10-CM | POA: Diagnosis present

## 2016-08-23 DIAGNOSIS — M40209 Unspecified kyphosis, site unspecified: Secondary | ICD-10-CM | POA: Insufficient documentation

## 2016-08-23 DIAGNOSIS — Z7901 Long term (current) use of anticoagulants: Secondary | ICD-10-CM | POA: Diagnosis not present

## 2016-08-23 DIAGNOSIS — M5416 Radiculopathy, lumbar region: Secondary | ICD-10-CM | POA: Diagnosis not present

## 2016-08-23 HISTORY — DX: Essential (primary) hypertension: I10

## 2016-08-23 HISTORY — DX: Basal cell carcinoma of skin, unspecified: C44.91

## 2016-08-23 LAB — CBC
HEMATOCRIT: 41.6 % (ref 40.0–52.0)
Hemoglobin: 14.4 g/dL (ref 13.0–18.0)
MCH: 30.3 pg (ref 26.0–34.0)
MCHC: 34.5 g/dL (ref 32.0–36.0)
MCV: 87.7 fL (ref 80.0–100.0)
PLATELETS: 267 10*3/uL (ref 150–440)
RBC: 4.75 MIL/uL (ref 4.40–5.90)
RDW: 14.8 % — AB (ref 11.5–14.5)
WBC: 9.1 10*3/uL (ref 3.8–10.6)

## 2016-08-23 LAB — PROTIME-INR
INR: 2.41
Prothrombin Time: 26.7 seconds — ABNORMAL HIGH (ref 11.4–15.2)

## 2016-08-23 LAB — TROPONIN I
Troponin I: <0.03 ng/mL (ref <0.03)
Troponin I: <0.03 ng/mL (ref <0.03)
Troponin I: <0.03 ng/mL (ref <0.03)

## 2016-08-23 LAB — BASIC METABOLIC PANEL
Anion gap: 8 (ref 5–15)
BUN: 7 mg/dL (ref 6–20)
CHLORIDE: 103 mmol/L (ref 101–111)
CO2: 28 mmol/L (ref 22–32)
CREATININE: 0.98 mg/dL (ref 0.61–1.24)
Calcium: 9.7 mg/dL (ref 8.9–10.3)
GFR calc Af Amer: >60 mL/min (ref >60)
GFR calc non Af Amer: >60 mL/min (ref >60)
GLUCOSE: 108 mg/dL — AB (ref 65–99)
POTASSIUM: 3.4 mmol/L — AB (ref 3.5–5.1)
SODIUM: 139 mmol/L (ref 135–145)

## 2016-08-23 MED ORDER — ACETAMINOPHEN 325 MG PO TABS
650.0000 mg | ORAL_TABLET | Freq: Four times a day (QID) | ORAL | Status: DC | PRN
  Administered 2016-08-24: 650 mg via ORAL
  Filled 2016-08-23: qty 2

## 2016-08-23 MED ORDER — WARFARIN - PHYSICIAN DOSING INPATIENT
Freq: Every day | Status: DC
  Administered 2016-08-23: 18:00:00

## 2016-08-23 MED ORDER — BISACODYL 10 MG RE SUPP: 10.0000 mg | Freq: Every day | RECTAL | Status: DC | PRN

## 2016-08-23 MED ORDER — DOCUSATE SODIUM 100 MG PO CAPS
100.0000 mg | ORAL_CAPSULE | Freq: Two times a day (BID) | ORAL | Status: DC
  Administered 2016-08-23 – 2016-08-24 (×2): 100 mg via ORAL
  Filled 2016-08-23 (×2): qty 1

## 2016-08-23 MED ORDER — POTASSIUM CHLORIDE CRYS ER 10 MEQ PO TBCR
10.0000 meq | EXTENDED_RELEASE_TABLET | Freq: Every day | ORAL | Status: DC
  Administered 2016-08-24: 10 meq via ORAL
  Filled 2016-08-23 (×2): qty 1

## 2016-08-23 MED ORDER — WARFARIN SODIUM 6 MG PO TABS: 6.0000 mg | ORAL_TABLET | Freq: Every day | ORAL | Status: DC

## 2016-08-23 MED ORDER — DIPHENHYDRAMINE HCL 50 MG/ML IJ SOLN
50.0000 mg | Freq: Once | INTRAMUSCULAR | Status: AC
  Administered 2016-08-23: 50 mg via INTRAVENOUS
  Filled 2016-08-23: qty 1

## 2016-08-23 MED ORDER — WARFARIN SODIUM 6 MG PO TABS
6.0000 mg | ORAL_TABLET | Freq: Every day | ORAL | Status: DC
  Administered 2016-08-23: 6 mg via ORAL
  Filled 2016-08-23: qty 1

## 2016-08-23 MED ORDER — POTASSIUM CHLORIDE IN NACL 20-0.9 MEQ/L-% IV SOLN
INTRAVENOUS | Status: DC
  Administered 2016-08-23 – 2016-08-24 (×2): via INTRAVENOUS
  Filled 2016-08-23 (×3): qty 1000

## 2016-08-23 MED ORDER — TORSEMIDE 20 MG PO TABS
10.0000 mg | ORAL_TABLET | Freq: Every day | ORAL | Status: DC
  Administered 2016-08-24: 10 mg via ORAL
  Filled 2016-08-23: qty 1

## 2016-08-23 MED ORDER — VITAMIN B-12 1000 MCG PO TABS
1000.0000 ug | ORAL_TABLET | Freq: Every day | ORAL | Status: DC
  Administered 2016-08-24: 1000 ug via ORAL
  Filled 2016-08-23: qty 1

## 2016-08-23 MED ORDER — PANTOPRAZOLE SODIUM 40 MG PO TBEC
40.0000 mg | DELAYED_RELEASE_TABLET | Freq: Every day | ORAL | Status: DC
  Administered 2016-08-24: 40 mg via ORAL
  Filled 2016-08-23: qty 1

## 2016-08-23 MED ORDER — ONDANSETRON HCL 4 MG/2ML IJ SOLN: 4.0000 mg | Freq: Four times a day (QID) | INTRAMUSCULAR | Status: DC | PRN

## 2016-08-23 MED ORDER — SODIUM CHLORIDE 0.9 % IV BOLUS (SEPSIS)
500.0000 mL | Freq: Once | INTRAVENOUS | Status: AC
  Administered 2016-08-23: 500 mL via INTRAVENOUS

## 2016-08-23 MED ORDER — IOPAMIDOL (ISOVUE-370) INJECTION 76%
75.0000 mL | Freq: Once | INTRAVENOUS | Status: AC | PRN
  Administered 2016-08-23: 75 mL via INTRAVENOUS

## 2016-08-23 MED ORDER — METHYLPREDNISOLONE SODIUM SUCC 125 MG IJ SOLR
125.0000 mg | Freq: Once | INTRAMUSCULAR | Status: AC
  Administered 2016-08-23: 125 mg via INTRAVENOUS
  Filled 2016-08-23: qty 2

## 2016-08-23 MED ORDER — SODIUM CHLORIDE 0.9% FLUSH
3.0000 mL | Freq: Two times a day (BID) | INTRAVENOUS | Status: DC
  Administered 2016-08-24: 3 mL via INTRAVENOUS

## 2016-08-23 MED ORDER — ACETAMINOPHEN 650 MG RE SUPP: 650.0000 mg | Freq: Four times a day (QID) | RECTAL | Status: DC | PRN

## 2016-08-23 MED ORDER — ZOLPIDEM TARTRATE 5 MG PO TABS: 5.0000 mg | ORAL_TABLET | Freq: Every evening | ORAL | Status: DC | PRN

## 2016-08-23 MED ORDER — FAMOTIDINE IN NACL 20-0.9 MG/50ML-% IV SOLN
20.0000 mg | Freq: Once | INTRAVENOUS | Status: AC
  Administered 2016-08-23: 20 mg via INTRAVENOUS
  Filled 2016-08-23: qty 50

## 2016-08-23 MED ORDER — ALLOPURINOL 100 MG PO TABS
300.0000 mg | ORAL_TABLET | Freq: Every day | ORAL | Status: DC
  Administered 2016-08-24: 300 mg via ORAL
  Filled 2016-08-23: qty 3

## 2016-08-23 MED ORDER — ONDANSETRON HCL 4 MG PO TABS: 4.0000 mg | ORAL_TABLET | Freq: Four times a day (QID) | ORAL | Status: DC | PRN

## 2016-08-23 MED ORDER — BENAZEPRIL HCL 5 MG PO TABS
10.0000 mg | ORAL_TABLET | Freq: Every day | ORAL | Status: DC
  Administered 2016-08-24: 10 mg via ORAL
  Filled 2016-08-23: qty 2

## 2016-08-23 NOTE — ED Notes (Signed)
MD at bedside. 

## 2016-08-23 NOTE — H&P (Signed)
History and Physical    EDER STROME [id number] DOB: 1946-02-09 DOA: 08/23/2016  Referring physician: Dr. Alfred Levins PCP: D'Hanis, Chrissie Noa, MD  Specialists: Dr. Jefm Bryant  Chief Complaint: chest tightness with near syncope  HPI: Adrian [name] is a 70 y.o. male has a past medical history significant for RA, neuropathy, and chronic pain now with episodes of chest pain described as tightness associated with SOB, dizziness, and near syncope. No cardiac hx. Currently pain-free. In ER, EKG and troponin OK. He is now admitted.  Review of Systems: The patient denies anorexia, fever, weight loss,, vision loss, decreased hearing, hoarseness, syncope, peripheral edema, balance deficits, hemoptysis, abdominal pain, melena, hematochezia, severe indigestion/heartburn, hematuria, incontinence, genital sores, muscle weakness, suspicious skin lesions, transient blindness, difficulty walking, depression, unusual weight change, abnormal bleeding, enlarged lymph nodes, angioedema, and breast masses.   Past Medical History:  Diagnosis Date  . Anxiety   . Arthritis    rheumatoid  . Basal cell carcinoma of skin    skin  . Hypertension   . Lumbar radiculopathy 09/25/2014  . Pulmonary embolism ([place]) 05/25/2014   Overview:  Times 2 on anticoagulation a.  Times two.   b.  Filter placed.    Last Assessment & Plan:  Times 2 on anticoagulation a.  Times two.   b.  Filter placed. No current bleeding noted.    . Reflux    Past Surgical History:  Procedure Laterality Date  . HERNIA REPAIR     Social History:  reports that he has been smoking.  He has been smoking about 0.00 packs per day for the past 54.00 years. He has never used smokeless tobacco. He reports that he drinks alcohol. He reports that he does not use drugs.  Allergies  Allergen Reactions  . Duloxetine     Other reaction(s): Hallucination  . Pseudoephedrine Hcl     Other reaction(s): Other (See Comments) Other Reaction: ELEVATED BLOOD PRESSURE   . Shellfish Allergy Swelling    Uncoded Allergy. Allergen: seafood, Other Reaction: "THROAT CLOSING" Uncoded Allergy. Allergen: seafood, Other Reaction: "THROAT CLOSING"    Family History  Problem Relation Age of Onset  . Diabetes Mother   . Cancer Father     Prior to Admission medications   Medication Sig Start Date End Date Taking? Authorizing Provider  acetaminophen (TYLENOL) 500 MG tablet Take 1,000 mg by mouth every 6 (six) hours as needed.   Yes Historical Provider, MD  allopurinol (ZYLOPRIM) 300 MG tablet Take 300 mg by mouth daily.   Yes Historical Provider, MD  benazepril (LOTENSIN) 10 MG tablet Take 10 mg by mouth daily. Reported on 03/08/2016   Yes Historical Provider, MD  Multiple Vitamin (MULTIVITAMIN) tablet Take 1 tablet by mouth daily.   Yes Historical Provider, MD  pantoprazole (PROTONIX) 40 MG tablet Take 40 mg by mouth daily.   Yes Historical Provider, MD  potassium chloride (K-DUR) 10 MEQ tablet Take 10 mEq by mouth daily.   Yes Historical Provider, MD  torsemide (DEMADEX) 10 MG tablet Take 10 mg by mouth daily.   Yes Historical Provider, MD  vitamin B-12 (CYANOCOBALAMIN) 500 MCG tablet 1,000 mcg daily.  01/30/15  Yes Historical Provider, MD  warfarin (COUMADIN) 6 MG tablet Take 6 mg by mouth at bedtime.    Yes Historical Provider, MD   Physical Exam: There were no vitals filed for this visit.   General:  No apparent distress, WDWN, [place]/AT  Eyes: PERRL, EOMI, no scleral icterus, conjunctiva clear  ENT:  moist oropharynx without exudate, TM's benign, dentition fair  Neck: supple, no lymphadenopathy. No bruits or thyromegaly  Cardiovascular: regular rate without MRG; 2+ peripheral pulses, no JVD, no peripheral edema  Respiratory: CTA biL, good air movement without wheezing, rhonchi or crackled . Respiratory effort normal  Abdomen: soft, non tender to palpation, positive bowel sounds, no guarding, no rebound  Skin: no rashes or lesions  Musculoskeletal:  normal bulk and tone, no joint swelling  Psychiatric: normal mood and affect, A&OX3  Neurologic: CN 2-12 grossly intact, Motor strength 5/5 in all 4 groups with symmetric DTR's and non-focal sensory exam  Labs on Admission:  Basic Metabolic Panel:  Recent Labs Lab 08/23/16 1238  NA 139  K 3.4*  CL 103  CO2 28  GLUCOSE 108*  BUN 7  CREATININE 0.98  CALCIUM 9.7   Liver Function Tests: No results for input(s): AST, ALT, ALKPHOS, BILITOT, PROT, ALBUMIN in the last 168 hours. No results for input(s): LIPASE, AMYLASE in the last 168 hours. No results for input(s): AMMONIA in the last 168 hours. CBC:  Recent Labs Lab 08/23/16 1238  WBC 9.1  HGB 14.4  HCT 41.6  MCV 87.7  PLT 267   Cardiac Enzymes:  Recent Labs Lab 08/23/16 1238  TROPONINI <0.03    BNP (last 3 results) No results for input(s): BNP in the last 8760 hours.  ProBNP (last 3 results) No results for input(s): PROBNP in the last 8760 hours.  CBG: No results for input(s): GLUCAP in the last 168 hours.  Radiological Exams on Admission: Dg Chest 2 View  Result Date: 08/23/2016 CLINICAL DATA:  Shortness of breath, fatigue, diaphoresis, chest pain EXAM: CHEST  2 VIEW COMPARISON:  04/18/2014 FINDINGS: Upper normal heart size. Mediastinal contours and pulmonary vascularity normal. Minimal central peribronchial thickening. Linear scarring LEFT base. No infiltrate, pleural effusion or pneumothorax. Scattered degenerative disc disease changes thoracic spine. IMPRESSION: Minimal bronchitic changes and LEFT basilar scarring. No acute abnormalities. Electronically Signed   By: Lavonia Dana M.D.   On: 08/23/2016 13:10   Ct Angio Chest Pe W And/or Wo Contrast  Result Date: 08/23/2016 CLINICAL DATA:  Chest pain and shortness of Breath for 2 days. EXAM: CT ANGIOGRAPHY CHEST WITH CONTRAST TECHNIQUE: Multidetector CT imaging of the chest was performed using the standard protocol during bolus administration of intravenous  contrast. Multiplanar CT image reconstructions and MIPs were obtained to evaluate the vascular anatomy. CONTRAST:  75 cc Isovue 370 COMPARISON:  CT scan 06/30/2015 FINDINGS: Chest wall: No chest wall mass, supraclavicular or axillary lymphadenopathy. Small scattered lymph nodes are noted. The thyroid gland is grossly Cardiovascular: The heart is normal in size. No pericardial effusion. The aorta is normal in caliber. No dissection. Scattered Coronary artery calcifications are noted. The pulmonary arterial tree is fairly well opacified. No filling defects to suggest pulmonary embolism. Mediastinum/Nodes: No mediastinal or hilar mass or adenopathy. Small scattered lymph nodes are noted. The esophagus is grossly normal. Lungs/Pleura: No acute pulmonary findings. No infiltrates, edema or effusions. Minimal dependent subpleural atelectasis. Stable 5.5 mm left upper lobe pulmonary nodule on image number 38. This is stable since 2014 is considered benign. No new pulmonary lesions. No interstitial lung disease or bronchiectasis. Upper Abdomen: No significant findings. Musculoskeletal: Stable advanced degenerative changes involving the thoracic spine and exaggerated kyphosis. No acute bony findings. Stable lateral abdominal wall hernia on the left. Review of the MIP images confirms the above findings. IMPRESSION: 1. No CT findings for pulmonary embolism. 2. Normal thoracic aorta.  3. Stable Coronary artery calcifications. 4. No mediastinal or hilar mass or adenopathy. 5. No acute pulmonary findings. Electronically Signed   By: Marijo Sanes M.D.   On: 08/23/2016 14:21    EKG: Independently reviewed.  Assessment/Plan Principal Problem:   Chest pain Active Problems:   Chronic pain   HLD (hyperlipidemia)   Near syncope   Will observe on telemetry. Follow enzymes. Order carotid dopplers and echo and consult Cardiology. Repeat labs in AM.  Diet: heart healthy Fluids: NS with K+@75  DVT Prophylaxis:  coumadin  Code Status: FULL  Family Communication: yes  Disposition Plan: home  Time spent: 50 min

## 2016-08-23 NOTE — ED Provider Notes (Signed)
[facility]    CSN: [id number] Arrival date & time: 08/23/16  1056  First Provider Contact:  First MD Initiated Contact with Patient 08/23/16 1134        History   Chief Complaint Chief Complaint  Patient presents with  . Allergic Reaction    HPI Adrian [name] is a 70 y.o. male.   Patient symptoms shortness of breath. He states that he was started on low-dose Elavil 25 mg on Thursday night since then he has not felt well. Daily he has had shortness of breath fatigue and tiredness. He stopped the Elavil on Saturday still has some shortness of breath and fatigue on Sunday today is worse. Suzan Garibaldi who about 2-3 shirts already can stop sweating. Feels anxious as well. He does have a history of anxiety attacks but he doesn't have diaphoresis or sweating when he has his anxiety attacks. He's had shortness of breath before but doesn't have symptoms of sweatiness and anxiety on this is regular shortness of breath. He exhibits himself he becomes short of breath. He denies any chest pain. He does have a history though of having blood clots and he actually has a shunt put in which she forgot to inform the nurse on his arrival.   He has had hernia repair and filter placed as well in the past. He is allergic to pseudoephedrine duloxetine and shellfish allergies. He still smokes. His mother had diabetes and his father had cancer.   The history is provided by the patient. No language interpreter was used.  Allergic Reaction  Presenting symptoms: difficulty breathing   Presenting symptoms: no itching, no swelling and no wheezing   Severity:  Moderate Duration:  4 days Context: medications   Relieved by:  Nothing Worsened by:  Nothing Ineffective treatments:  None tried   Past Medical History:  Diagnosis Date  . Anxiety   . Arthritis    rheumatoid  . Cancer ([place])    skin  . Lumbar radiculopathy 09/25/2014  . Pulmonary embolism ([place]) 05/25/2014   Overview:  Times 2 on  anticoagulation a.  Times two.   b.  Filter placed.    Last Assessment & Plan:  Times 2 on anticoagulation a.  Times two.   b.  Filter placed. No current bleeding noted.    . Reflux     Patient Active Problem List   Diagnosis Date Noted  . Osteoarthritis of both hips 03/08/2016  . Arthritis of knee, degenerative 02/27/2016  . Elevated sedimentation rate 02/11/2016  . Elevated C-reactive protein (CRP) 02/11/2016  . Abnormal MRI, lumbar spine (02/03/2016) 02/10/2016  . Lumbar spondylosis 02/10/2016  . Abnormal NCS (nerve conduction studies) 02/10/2016  . Chronic anticoagulation (Coumadin and Plaquenil) (due to pulmonary embolism) 02/09/2016  . Chronic low back pain (Location of Tertiary source of pain) (Right) 02/09/2016  . Chronic pain 12/10/2015  . Neuropathy ([place]) (lower extremity peripheral neuropathy diagnosed by EMG and PNCV done by Dr. Melrose Nakayama) 12/10/2015  . Chronic lower extremity pain (Location of Primary Source of Pain) (Bilateral) (R>L) 12/10/2015  . Chronic hand pain (Bilateral) (neuropathy) 12/10/2015  . Chronic knee pain (Location of Secondary source of pain) (Bilateral) (R>L) 12/10/2015  . Inflammatory pain 12/10/2015  . Neuropathic pain 12/10/2015  . Neurogenic pain 12/10/2015  . Chronic lumbar radicular pain (S1 Dermatome) (Right) 12/10/2015  . Long term current use of opiate analgesic 12/10/2015  . Long term prescription opiate use 12/10/2015  . Opiate use 12/10/2015  . Bleeding gastrointestinal 12/10/2015  .  HLD (hyperlipidemia) 12/10/2015  . History of pulmonary embolism x 2 12/10/2015  . Chronic lumbar radiculopathy (Right) 12/10/2015  . Diffuse myofascial pain syndrome 12/10/2015  . Encounter for therapeutic drug level monitoring 12/10/2015  . Encounter for chronic pain management 12/10/2015  . Encounter for pain management planning 12/10/2015  . Mixed simple and mucopurulent chronic bronchitis ([place]) 03/27/2015  . Loss of feeling or sensation 02/11/2015  .  Polyneuropathy ([place]) 02/11/2015  . Low serum cobalamin 11/11/2014  . Intermittent claudication ([place]) 09/25/2014  . Major depression in remission ([place]) 09/25/2014  . Lumbar canal stenosis (Severe: L4-5) (Moderate: L3-4 and L5-S1) (Mild: L1-2, L2-3) 09/25/2014  . Morbid (severe) obesity due to excess calories ([place]) 09/25/2014  . Anxiety 05/25/2014  . Chronic headache 05/25/2014  . Acid reflux 05/25/2014  . Benign hypertension 05/25/2014  . Blood glucose elevated 05/25/2014  . Obstructive apnea 05/25/2014  . Pulmonary embolism ([place]) 05/25/2014    Past Surgical History:  Procedure Laterality Date  . HERNIA REPAIR      OB History    No data available       Home Medications    Prior to Admission medications   Medication Sig Start Date End Date Taking? Authorizing Provider  allopurinol (ZYLOPRIM) 300 MG tablet Take 300 mg by mouth daily.   Yes Historical Provider, MD  amitriptyline (ELAVIL) 25 MG tablet Take 25 mg by mouth at bedtime.   Yes Historical Provider, MD  benazepril (LOTENSIN) 10 MG tablet Take 10 mg by mouth daily. Reported on 03/08/2016   Yes Historical Provider, MD  Multiple Vitamin (MULTIVITAMIN) tablet Take 1 tablet by mouth daily.   Yes Historical Provider, MD  pantoprazole (PROTONIX) 40 MG tablet Take 40 mg by mouth daily.   Yes Historical Provider, MD  potassium chloride (K-DUR) 10 MEQ tablet Take 10 mEq by mouth daily.   Yes Historical Provider, MD  torsemide (DEMADEX) 10 MG tablet Take 10 mg by mouth daily.   Yes Historical Provider, MD  vitamin B-12 (CYANOCOBALAMIN) 500 MCG tablet 1,000 mcg daily.  01/30/15  Yes Historical Provider, MD  warfarin (COUMADIN) 6 MG tablet Take 7 mg by mouth daily.    Yes Historical Provider, MD  acetaminophen (TYLENOL) 500 MG tablet Take 1,000 mg by mouth every 6 (six) hours as needed.    Historical Provider, MD  escitalopram (LEXAPRO) 10 MG tablet Take 10 mg by mouth daily.    Historical Provider, MD  venlafaxine XR (EFFEXOR-XR) 75  MG 24 hr capsule TAKE 1 CAPSULE (75 MG TOTAL) BY MOUTH ONCE DAILY. 01/16/16   Historical Provider, MD    Family History Family History  Problem Relation Age of Onset  . Diabetes Mother   . Cancer Father     Social History Social History  Substance Use Topics  . Smoking status: Light Tobacco Smoker    Packs/day: 0.00    Years: 54.00  . Smokeless tobacco: Never Used  . Alcohol use 0.0 oz/week     Comment: rare     Allergies   Duloxetine; Pseudoephedrine hcl; and Shellfish allergy   Review of Systems Review of Systems  Respiratory: Positive for shortness of breath. Negative for wheezing.   Cardiovascular: Negative for chest pain, palpitations and leg swelling.  Skin: Negative for itching.  Psychiatric/Behavioral: Positive for behavioral problems.  All other systems reviewed and are negative.    Physical Exam Triage Vital Signs ED Triage Vitals  Enc Vitals Group     BP 08/23/16 1116 (!) 164/80  Pulse Rate 08/23/16 1116 85     Resp 08/23/16 1116 20     Temp 08/23/16 1116 97.8 F (36.6 C)     Temp Source 08/23/16 1116 Oral     SpO2 08/23/16 1116 99 %     Weight 08/23/16 1112 290 lb (131.5 kg)     Height 08/23/16 1112 6' (1.829 m)     Head Circumference --      Peak Flow --      Pain Score 08/23/16 1116 0     Pain Loc --      Pain Edu? --      Excl. in [place]? --    No data found.   Updated Vital Signs BP (!) 164/80 (BP Location: Right Arm)   Pulse 85   Temp 97.8 F (36.6 C) (Oral)   Resp 20   Ht 6' (1.829 m)   Wt 290 lb (131.5 kg)   SpO2 99%   BMI 39.33 kg/m   Visual Acuity Right Eye Distance:   Left Eye Distance:   Bilateral Distance:    Right Eye Near:   Left Eye Near:    Bilateral Near:     Physical Exam  Constitutional: He is oriented to person, place, and time. He appears well-developed and well-nourished. He is active. He appears distressed.  Obese white male  HENT:  Head: Normocephalic and atraumatic.  Eyes: Pupils are equal, round,  and reactive to light.  Cardiovascular: Normal rate, regular rhythm and normal heart sounds.   Pulmonary/Chest: Effort normal and breath sounds normal. No respiratory distress. He has no wheezes.  Abdominal: Soft.  Musculoskeletal: Normal range of motion.  Neurological: He is alert and oriented to person, place, and time.  Skin: Skin is warm and dry. He is not diaphoretic.  Psychiatric: His mood appears anxious. His affect is not blunt.  Vitals reviewed.    UC Treatments / Results  Labs (all labs ordered are listed, but only abnormal results are displayed) Labs Reviewed - No data to display  EKG  EKG Interpretation None      ED ECG REPORT I, Wanona Stare H, the attending physician, personally viewed and interpreted this ECG.   Date: 08/23/2016  EKG Time: 11:28:45  Rate:78  Rhythm: there are no previous tracings available for comparison, normal sinus rhythm  Axis: 34  Intervals:Left axis deviation  ST&T Change: none Radiology No results found.  Procedures Procedures (including critical care time)  Medications Ordered in UC Medications - No data to display   Initial Impression / Assessment and Plan / UC Course  I have reviewed the triage vital signs and the nursing notes.  Pertinent labs & imaging results that were available during my care of the patient were reviewed by me and considered in my medical decision making (see chart for details).  Clinical Course   Patient with history of PE before in the past who presents with diaphoresis redness of breath and anxiety. Is concerned about the overall causing the problem which I cannot really agree is a problem considering that he's been off of it now for over 24 hours as low dose. His symphysis history of PE and filter recommend he go to [facility] ED for pulmonary angiogram for further evaluation of possible blood clot. D-dimer is OB positive with his history so we'll do blood work here. Offered EMS  transportation but states his wife will take him instead.  Final Clinical Impressions(s) / UC Diagnoses   Final diagnoses:  Shortness of breath  Diaphoresis  Anxiety    New Prescriptions New Prescriptions   No medications on file     Frederich Cha, MD 08/23/16 1222

## 2016-08-23 NOTE — ED Triage Notes (Addendum)
Patient presents with a complaint of allergic reaction to a new medicine that he has been taking for 3 days. Amitriptyline HCL 25mg  for neuropathy. He says he has been very sweaty, dizzy, short of breath the past two days and pressure in his chest.

## 2016-08-23 NOTE — ED Triage Notes (Signed)
Pt sent from urgent care in [place] with c/o chest pain/SOB/dizziness/sweats for the past couple of days with a HX of PE.

## 2016-08-23 NOTE — ED Notes (Signed)
Ekg said to have been done in triage prior to placement in room

## 2016-08-23 NOTE — ED Provider Notes (Signed)
[facility] Emergency Department Provider Note  ____________________________________________  Time seen: Approximately 1:36 PM  I have reviewed the triage vital signs and the nursing notes.   HISTORY  Chief Complaint Shortness of Breath and Chest Pain   HPI Adrian [name] is a 70 y.o. male the history of PE on Coumadin status post IVC filter, rheumatoid arthritis, chronic pain, and anxiety who presents for evaluation of multiple episodes of chest pain, shortness of breath, near syncope. Patient reports the symptoms started 12 hours after he was started on amitriptyline. He was concerned initially that this was a side effect and stopped taking the medication more than 24 hours ago however the symptoms have not resolved. He reports 4-5 episodes a day of chest pressure associated with shortness of breath and feeling like is going to pass out. He also endorses diaphoresis with these episodes and nausea. He denies ever having anything like this before. He denies personal or family history of ischemic heart disease. He is a smoker. Today he went to urgent care and was sent here for evaluation. Patient currently endorses no chest pain or shortness of breath and reports that these episodes are intermittent and short-lived lasting a few seconds at a time. He denies fever, cough, abdominal pain, diarrhea  Past Medical History:  Diagnosis Date  . Anxiety   . Arthritis    rheumatoid  . Basal cell carcinoma of skin    skin  . Hypertension   . Lumbar radiculopathy 09/25/2014  . Pulmonary embolism ([place]) 05/25/2014   Overview:  Times 2 on anticoagulation a.  Times two.   b.  Filter placed.    Last Assessment & Plan:  Times 2 on anticoagulation a.  Times two.   b.  Filter placed. No current bleeding noted.    . Reflux     Patient Active Problem List   Diagnosis Date Noted  . Osteoarthritis of both hips 03/08/2016  . Arthritis of knee, degenerative 02/27/2016  . Elevated  sedimentation rate 02/11/2016  . Elevated C-reactive protein (CRP) 02/11/2016  . Abnormal MRI, lumbar spine (02/03/2016) 02/10/2016  . Lumbar spondylosis 02/10/2016  . Abnormal NCS (nerve conduction studies) 02/10/2016  . Chronic anticoagulation (Coumadin and Plaquenil) (due to pulmonary embolism) 02/09/2016  . Chronic low back pain (Location of Tertiary source of pain) (Right) 02/09/2016  . Chronic pain 12/10/2015  . Neuropathy ([place]) (lower extremity peripheral neuropathy diagnosed by EMG and PNCV done by Dr. Melrose Nakayama) 12/10/2015  . Chronic lower extremity pain (Location of Primary Source of Pain) (Bilateral) (R>L) 12/10/2015  . Chronic hand pain (Bilateral) (neuropathy) 12/10/2015  . Chronic knee pain (Location of Secondary source of pain) (Bilateral) (R>L) 12/10/2015  . Inflammatory pain 12/10/2015  . Neuropathic pain 12/10/2015  . Neurogenic pain 12/10/2015  . Chronic lumbar radicular pain (S1 Dermatome) (Right) 12/10/2015  . Long term current use of opiate analgesic 12/10/2015  . Long term prescription opiate use 12/10/2015  . Opiate use 12/10/2015  . Bleeding gastrointestinal 12/10/2015  . HLD (hyperlipidemia) 12/10/2015  . History of pulmonary embolism x 2 12/10/2015  . Chronic lumbar radiculopathy (Right) 12/10/2015  . Diffuse myofascial pain syndrome 12/10/2015  . Encounter for therapeutic drug level monitoring 12/10/2015  . Encounter for chronic pain management 12/10/2015  . Encounter for pain management planning 12/10/2015  . Mixed simple and mucopurulent chronic bronchitis ([place]) 03/27/2015  . Loss of feeling or sensation 02/11/2015  . Polyneuropathy ([place]) 02/11/2015  . Low serum cobalamin 11/11/2014  . Intermittent claudication (  [place]) 09/25/2014  . Major depression in remission ([place]) 09/25/2014  . Lumbar canal stenosis (Severe: L4-5) (Moderate: L3-4 and L5-S1) (Mild: L1-2, L2-3) 09/25/2014  . Morbid (severe) obesity due to excess calories ([place]) 09/25/2014  . Anxiety  05/25/2014  . Chronic headache 05/25/2014  . Acid reflux 05/25/2014  . Benign hypertension 05/25/2014  . Blood glucose elevated 05/25/2014  . Obstructive apnea 05/25/2014  . Pulmonary embolism ([place]) 05/25/2014    Past Surgical History:  Procedure Laterality Date  . HERNIA REPAIR      Prior to Admission medications   Medication Sig Start Date End Date Taking? Authorizing Provider  acetaminophen (TYLENOL) 500 MG tablet Take 1,000 mg by mouth every 6 (six) hours as needed.    Historical Provider, MD  allopurinol (ZYLOPRIM) 300 MG tablet Take 300 mg by mouth daily.    Historical Provider, MD  amitriptyline (ELAVIL) 25 MG tablet Take 25 mg by mouth at bedtime.    Historical Provider, MD  benazepril (LOTENSIN) 10 MG tablet Take 10 mg by mouth daily. Reported on 03/08/2016    Historical Provider, MD  escitalopram (LEXAPRO) 10 MG tablet Take 10 mg by mouth daily.    Historical Provider, MD  Multiple Vitamin (MULTIVITAMIN) tablet Take 1 tablet by mouth daily.    Historical Provider, MD  pantoprazole (PROTONIX) 40 MG tablet Take 40 mg by mouth daily.    Historical Provider, MD  potassium chloride (K-DUR) 10 MEQ tablet Take 10 mEq by mouth daily.    Historical Provider, MD  torsemide (DEMADEX) 10 MG tablet Take 10 mg by mouth daily.    Historical Provider, MD  venlafaxine XR (EFFEXOR-XR) 75 MG 24 hr capsule TAKE 1 CAPSULE (75 MG TOTAL) BY MOUTH ONCE DAILY. 01/16/16   Historical Provider, MD  vitamin B-12 (CYANOCOBALAMIN) 500 MCG tablet 1,000 mcg daily.  01/30/15   Historical Provider, MD  warfarin (COUMADIN) 6 MG tablet Take 7 mg by mouth daily.     Historical Provider, MD    Allergies Duloxetine; Pseudoephedrine hcl; and Shellfish allergy  Family History  Problem Relation Age of Onset  . Diabetes Mother   . Cancer Father     Social History Social History  Substance Use Topics  . Smoking status: Light Tobacco Smoker    Packs/day: 0.00    Years: 54.00  . Smokeless tobacco: Never Used    . Alcohol use 0.0 oz/week     Comment: rare    Review of Systems  Constitutional: Negative for fever. + Lightheadedness Eyes: Negative for visual changes. ENT: Negative for sore throat. Cardiovascular: + chest pain. Respiratory: + shortness of breath. Gastrointestinal: Negative for abdominal pain, vomiting or diarrhea. Genitourinary: Negative for dysuria. Musculoskeletal: Negative for back pain. Skin: Negative for rash. Neurological: Negative for headaches, weakness or numbness.  ____________________________________________   PHYSICAL EXAM:  VITAL SIGNS: 97.8 F (36.6 C) Oral 20 -- 99 % -- 85 Dinamap --  164/80    Constitutional: Alert and oriented. Well appearing and in no apparent distress. HEENT:      Head: Normocephalic and atraumatic.         Eyes: Conjunctivae are normal. Sclera is non-icteric. EOMI. PERRL      Mouth/Throat: Mucous membranes are moist.       Neck: Supple with no signs of meningismus. Cardiovascular: Regular rate and rhythm. No murmurs, gallops, or rubs. 2+ symmetrical distal pulses are present in all extremities. No JVD. Respiratory: Normal respiratory effort. Lungs are clear to auscultation bilaterally. No wheezes, crackles, or rhonchi.  Gastrointestinal: Soft, non tender, and non distended with positive bowel sounds. No rebound or guarding. Genitourinary: No CVA tenderness. Musculoskeletal: Nontender with normal range of motion in all extremities. No edema, cyanosis, or erythema of extremities. Neurologic: Normal speech and language. Face is symmetric. Moving all extremities. No gross focal neurologic deficits are appreciated. Skin: Skin is warm, dry and intact. No rash noted. Psychiatric: Mood and affect are normal. Speech and behavior are normal.  ____________________________________________   LABS (all labs ordered are listed, but only abnormal results are displayed)  Labs Reviewed  BASIC METABOLIC PANEL - Abnormal; Notable for the  following:       Result Value   Potassium 3.4 (*)    Glucose, Bld 108 (*)    All other components within normal limits  CBC - Abnormal; Notable for the following:    RDW 14.8 (*)    All other components within normal limits  PROTIME-INR - Abnormal; Notable for the following:    Prothrombin Time 26.7 (*)    All other components within normal limits  TROPONIN I   ____________________________________________  EKG  ED ECG REPORT I, Rudene Re, the attending physician, personally viewed and interpreted this ECG.  Normal sinus rhythm, rate of 78, normal intervals, left axis deviation, no ST elevations or depressions. Unchanged from prior. ____________________________________________  RADIOLOGY  CXR:  Minimal bronchitic changes and LEFT basilar scarring.  No acute abnormalities. ____________________________________________   PROCEDURES  Procedure(s) performed: None Procedures Critical Care performed:  None ____________________________________________   INITIAL IMPRESSION / ASSESSMENT AND PLAN / ED COURSE  70 y.o. male the history of PE on Coumadin status post IVC filter, rheumatoid arthritis, chronic pain, and anxiety who presents for evaluation of multiple episodes of chest pain, shortness of breath, near syncope. Patient worried this is side effect of amitriptyline however hasn't taken in the last 24 hours and continues to have similar symptoms. At this time he reports no chest pain or shortness of breath. He is hemodynamically stable, physical exam is within normal limits. EKG with no evidence of ischemia. We'll plan to get a CTA to rule out as patient is extremely anxious and wants to make sure that there is not a blood clot. I have very low suspicion due to the fact the patient has an IVC filter, has therapeutic INR, and intermittent symptoms. I am concerned for possible arrhythmia with multiple episodes daily. If CT is negative will plan to admit for syncope  evaluation.  Clinical Course  Comment By Time  CT negative. Patient remains hemodynamically stable with no chest pain or shortness of breath. We'll discuss with the hospitalist for admission. Rudene Re, MD 09/04 1444    Pertinent labs & imaging results that were available during my care of the patient were reviewed by me and considered in my medical decision making (see chart for details).    ____________________________________________   FINAL CLINICAL IMPRESSION(S) / ED DIAGNOSES  Final diagnoses:  Near syncope  Shortness of breath  Chest pain, unspecified chest pain type      NEW MEDICATIONS STARTED DURING THIS VISIT:  New Prescriptions   No medications on file     Note:  This document was prepared using Dragon voice recognition software and may include unintentional dictation errors.    Rudene Re, MD 08/23/16 1447

## 2016-08-23 NOTE — Progress Notes (Signed)
Patient admitted to 2A from Ed. Patient alert and oriented x4. Skin and tele box completed with Patent examiner. Patient continues on iv fluid as ordered. Due med's administered as ordred. Resident stable at this time, denies SOB and chest pain, will cont. To monitor.

## 2016-08-24 ENCOUNTER — Observation Stay
Admit: 2016-08-24 | Discharge: 2016-08-24 | Disposition: A | Discharge status: Home / Self Care | Payer: Commercial Managed Care - HMO | Attending: Internal Medicine | Admitting: Internal Medicine

## 2016-08-24 ENCOUNTER — Observation Stay: Payer: Commercial Managed Care - HMO

## 2016-08-24 DIAGNOSIS — R079 Chest pain, unspecified: Secondary | ICD-10-CM | POA: Diagnosis not present

## 2016-08-24 DIAGNOSIS — E785 Hyperlipidemia, unspecified: Secondary | ICD-10-CM | POA: Diagnosis not present

## 2016-08-24 DIAGNOSIS — G894 Chronic pain syndrome: Secondary | ICD-10-CM | POA: Diagnosis not present

## 2016-08-24 DIAGNOSIS — I6523 Occlusion and stenosis of bilateral carotid arteries: Secondary | ICD-10-CM | POA: Diagnosis not present

## 2016-08-24 DIAGNOSIS — R55 Syncope and collapse: Secondary | ICD-10-CM | POA: Diagnosis not present

## 2016-08-24 LAB — CBC
HEMATOCRIT: 38.6 % — AB (ref 40.0–52.0)
HEMOGLOBIN: 13.6 g/dL (ref 13.0–18.0)
MCH: 30.5 pg (ref 26.0–34.0)
MCHC: 35.2 g/dL (ref 32.0–36.0)
MCV: 86.6 fL (ref 80.0–100.0)
Platelets: 284 10*3/uL (ref 150–440)
RBC: 4.46 MIL/uL (ref 4.40–5.90)
RDW: 15.2 % — ABNORMAL HIGH (ref 11.5–14.5)
WBC: 8.8 10*3/uL (ref 3.8–10.6)

## 2016-08-24 LAB — ECHOCARDIOGRAM COMPLETE
AVLVOTPG: 7 mmHg
E decel time: 232 msec
E/e' ratio: 9.05
FS: 42 % (ref 28–44)
HEIGHTINCHES: 72 in
IVS/LV PW RATIO, ED: 1.14
LA ID, A-P, ES: 42 mm
LA diam index: 1.67 cm/m2
LA vol A4C: 45.5 ml
LA vol index: 16.7 mL/m2
LAVOL: 41.9 mL
LEFT ATRIUM END SYS DIAM: 42 mm
LVEEAVG: 9.05
LVEEMED: 9.05
LVELAT: 6.64 cm/s
LVOT area: 3.46 cm2
LVOT diameter: 21 mm
LVOTPV: 128 cm/s
Lateral S' vel: 20.4 cm/s
MV Dec: 232
MVPKAVEL: 69.8 m/s
MVPKEVEL: 60.1 m/s
PW: 10.9 mm — AB (ref 0.6–1.1)
RV TAPSE: 31 mm
TDI e' lateral: 6.64
TDI e' medial: 7.94
WEIGHTICAEL: 4692.8 [oz_av]

## 2016-08-24 LAB — COMPREHENSIVE METABOLIC PANEL
ALBUMIN: 4 g/dL (ref 3.5–5.0)
ALT: 22 U/L (ref 17–63)
AST: 32 U/L (ref 15–41)
Alkaline Phosphatase: 75 U/L (ref 38–126)
Anion gap: 9 (ref 5–15)
BILIRUBIN TOTAL: 0.6 mg/dL (ref 0.3–1.2)
BUN: 9 mg/dL (ref 6–20)
CALCIUM: 9.7 mg/dL (ref 8.9–10.3)
CO2: 23 mmol/L (ref 22–32)
CREATININE: 0.79 mg/dL (ref 0.61–1.24)
Chloride: 106 mmol/L (ref 101–111)
GFR calc Af Amer: >60 mL/min (ref >60)
Glucose, Bld: 171 mg/dL — ABNORMAL HIGH (ref 65–99)
Potassium: 4.1 mmol/L (ref 3.5–5.1)
Sodium: 138 mmol/L (ref 135–145)
Total Protein: 7.1 g/dL (ref 6.5–8.1)

## 2016-08-24 LAB — TROPONIN I: Troponin I: <0.03 ng/mL (ref <0.03)

## 2016-08-24 LAB — GLUCOSE, CAPILLARY: Glucose-Capillary: 166 mg/dL — ABNORMAL HIGH (ref 65–99)

## 2016-08-24 LAB — PROTIME-INR
INR: 2.27
Prothrombin Time: 25.4 seconds — ABNORMAL HIGH (ref 11.4–15.2)

## 2016-08-24 LAB — TSH: TSH: 0.534 u[IU]/mL (ref 0.350–4.500)

## 2016-08-24 MED ORDER — CARVEDILOL 6.25 MG PO TABS: 6.2500 mg | ORAL_TABLET | Freq: Two times a day (BID) | ORAL | 0 refills | Status: DC

## 2016-08-24 MED ORDER — ALPRAZOLAM 0.25 MG PO TABS: 0.2500 mg | ORAL_TABLET | Freq: Three times a day (TID) | ORAL | 0 refills | Status: DC | PRN

## 2016-08-24 MED ORDER — PERFLUTREN LIPID MICROSPHERE
INTRAVENOUS | Status: AC
  Filled 2016-08-24: qty 10

## 2016-08-24 MED ORDER — ALPRAZOLAM 0.25 MG PO TABS
0.2500 mg | ORAL_TABLET | Freq: Three times a day (TID) | ORAL | Status: DC | PRN
  Administered 2016-08-24: 0.25 mg via ORAL
  Filled 2016-08-24: qty 1

## 2016-08-24 MED ORDER — CARVEDILOL 6.25 MG PO TABS
6.2500 mg | ORAL_TABLET | Freq: Two times a day (BID) | ORAL | Status: DC
  Administered 2016-08-24: 6.25 mg via ORAL
  Filled 2016-08-24: qty 1

## 2016-08-24 NOTE — Consult Note (Signed)
[facility]  CARDIOLOGY CONSULT NOTE  Patient ID: Adrian [name] MRN: [id number] DOB/AGE: January 01, 1946 70 y.o.  Admit date: 08/23/2016 Referring Physician vachhani Primary Physician   Primary Cardiologist   Reason for Consultation Chest pain  HPI: Patient is a 70 year old male who has a past history of rheumatoid arthritis and neuropathy was recently placed on amitriptyline. After being placed on this he began developing chest tightness and shortness of breath dizziness and near-syncope. This persisted after taking the drug for several days and discontinued it. Symptoms persisted. He presented to the emergency room where he ruled out for myocardial infarction. EKG showed sinus rhythm with no ischemia. She has a history of pulmonary emboli and DVT. Chest CT was negative for pulmonary emboli. Carotid Dopplers are unremarkable. Echocardiogram is pending. Patient has had no further chest pain and is anxious to go home.  Review of Systems  Constitutional: Negative.   HENT: Negative.   Eyes: Negative.   Respiratory: Positive for shortness of breath.   Cardiovascular: Positive for chest pain.  Gastrointestinal: Negative.   Genitourinary: Negative.   Musculoskeletal: Negative.   Skin: Negative.   Neurological: Positive for dizziness.  Endo/Heme/Allergies: Negative.   Psychiatric/Behavioral: Negative.     Past Medical History:  Diagnosis Date  . Anxiety   . Arthritis    rheumatoid  . Basal cell carcinoma of skin    skin  . Hypertension   . Lumbar radiculopathy 09/25/2014  . Pulmonary embolism ([place]) 05/25/2014   Overview:  Times 2 on anticoagulation a.  Times two.   b.  Filter placed.    Last Assessment & Plan:  Times 2 on anticoagulation a.  Times two.   b.  Filter placed. No current bleeding noted.    . Reflux     Family History  Problem Relation Age of Onset  . Diabetes Mother   . Cancer Father     Social History   Social History  . Marital  status: Married    Spouse name: N/A  . Number of children: N/A  . Years of education: N/A   Occupational History  . Not on file.   Social History Main Topics  . Smoking status: Light Tobacco Smoker    Packs/day: 0.00    Years: 54.00  . Smokeless tobacco: Never Used  . Alcohol use 0.0 oz/week     Comment: rare  . Drug use: No  . Sexual activity: Not on file   Other Topics Concern  . Not on file   Social History Narrative  . No narrative on file    Past Surgical History:  Procedure Laterality Date  . HERNIA REPAIR       Prescriptions Prior to Admission  Medication Sig Dispense Refill Last Dose  . acetaminophen (TYLENOL) 500 MG tablet Take 1,000 mg by mouth every 6 (six) hours as needed.   prn at prn  . allopurinol (ZYLOPRIM) 300 MG tablet Take 300 mg by mouth daily.   08/23/2016 at 0700  . benazepril (LOTENSIN) 10 MG tablet Take 10 mg by mouth daily. Reported on 03/08/2016   08/23/2016 at 0700  . Multiple Vitamin (MULTIVITAMIN) tablet Take 1 tablet by mouth daily.   08/23/2016 at 0700  . pantoprazole (PROTONIX) 40 MG tablet Take 40 mg by mouth daily.   08/23/2016 at 0700  . potassium chloride (K-DUR) 10 MEQ tablet Take 10 mEq by mouth daily.   08/23/2016 at 0700  . torsemide (DEMADEX) 10 MG tablet Take 10  mg by mouth daily.   08/23/2016 at 0700  . vitamin B-12 (CYANOCOBALAMIN) 500 MCG tablet 1,000 mcg daily.    08/23/2016 at 0700  . warfarin (COUMADIN) 6 MG tablet Take 6 mg by mouth at bedtime.    08/22/2016 at 1930    Physical Exam: Blood pressure (!) 174/80, pulse (!) 102, temperature 98.5 F (36.9 C), temperature source Oral, resp. rate (!) 22, height 6' (1.829 m), weight 133 kg (293 lb 4.8 oz), SpO2 99 %.   Wt Readings from Last 1 Encounters:  08/24/16 133 kg (293 lb 4.8 oz)     General appearance: alert and cooperative Resp: clear to auscultation bilaterally Cardio: regular rate and rhythm GI: soft, non-tender; bowel sounds normal; no masses,  no organomegaly Extremities:  extremities normal, atraumatic, no cyanosis or edema Neurologic: Grossly normal  Labs:   Lab Results  Component Value Date   WBC 8.8 08/24/2016   HGB 13.6 08/24/2016   HCT 38.6 (L) 08/24/2016   MCV 86.6 08/24/2016   PLT 284 08/24/2016    Recent Labs Lab 08/24/16 0425  NA 138  K 4.1  CL 106  CO2 23  BUN 9  CREATININE 0.79  CALCIUM 9.7  PROT 7.1  BILITOT 0.6  ALKPHOS 75  ALT 22  AST 32  GLUCOSE 171*   Lab Results  Component Value Date   CKTOTAL 77 04/27/2013   CKMB 1.4 04/27/2013   TROPONINI <0.03 08/24/2016      Radiology: No acute cardiopulmonary disease on chest x-ray. No CT findings for pulmonary embolism and chest CT. Carotid Dopplers revealed less than 50% stenosis bilaterally. EKG: Normal sinus rhythm with no ischemia  ASSESSMENT AND PLAN:  Patient with history of DVT who presented with chest discomfort and shortness of breath. No evidence of pulmonary embolus on chest CT. Carotid Dopplers were unremarkable. Patient is ruled out for myocardial infarction. EKG is unremarkable. Consideration for functional study was discussed with the patient. He prefers to have this done as an outpatient. We'll review his echocardiogram and if unremarkable, would recommend discharge with outpatient follow-up in our office for consideration for functional study at that time. Would continue with current medical regimen. Signed: Teodoro Spray MD, [facility] 08/24/2016, 1:29 PM

## 2016-08-24 NOTE — Care Management Obs Status (Signed)
[place] NOTIFICATION   Patient Details  Name: CHAS BRANAN MRN: [id number] Date of Birth: 29-Mar-1946   Medicare Observation Status Notification Given:  Yes    Jolly Mango, RN 08/24/2016, 11:09 AM

## 2016-08-24 NOTE — Progress Notes (Signed)
[facility] [place] NAME: Adrian [name]    MR#:  [id number]  DATE OF BIRTH:  June 13, 1946  SUBJECTIVE:  CHIEF COMPLAINT:   Chief Complaint  Patient presents with  . Shortness of Breath  . Chest Pain     Came in with episode of anxiety and chest tightness for last few days.   Symptoms started after he started taking amitriptyline as suggested by his rheumatologist.   Feels better today but had episodes of tachycardia on telemetry.  REVIEW OF SYSTEMS:  CONSTITUTIONAL: No fever, fatigue or weakness.  EYES: No blurred or double vision.  EARS, NOSE, AND THROAT: No tinnitus or ear pain.  RESPIRATORY: No cough, Positive for shortness of breath, no wheezing or hemoptysis.  CARDIOVASCULAR: Positive for chest pain, orthopnea, edema.  GASTROINTESTINAL: No nausea, vomiting, diarrhea or abdominal pain.  GENITOURINARY: No dysuria, hematuria.  ENDOCRINE: No polyuria, nocturia,  HEMATOLOGY: No anemia, easy bruising or bleeding SKIN: No rash or lesion. MUSCULOSKELETAL: No joint pain or arthritis.   NEUROLOGIC: No tingling, numbness, weakness.  PSYCHIATRY: No anxiety or depression.   ROS  DRUG ALLERGIES:   Allergies  Allergen Reactions  . Duloxetine     Other reaction(s): Hallucination  . Pseudoephedrine Hcl     Other reaction(s): Other (See Comments) Other Reaction: ELEVATED BLOOD PRESSURE  . Shellfish Allergy Swelling    Uncoded Allergy. Allergen: seafood, Other Reaction: "THROAT CLOSING" Uncoded Allergy. Allergen: seafood, Other Reaction: "THROAT CLOSING"    VITALS:  Blood pressure (!) 174/80, pulse (!) 102, temperature 98.5 F (36.9 C), temperature source Oral, resp. rate (!) 22, height 6' (1.829 m), weight 133 kg (293 lb 4.8 oz), SpO2 99 %.  PHYSICAL EXAMINATION:  GENERAL:  70 y.o.-year-old patient lying in the bed with no acute distress.  EYES: Pupils equal, round, reactive to light and accommodation. No scleral icterus. Extraocular  muscles intact.  HEENT: Head atraumatic, normocephalic. Oropharynx and nasopharynx clear.  NECK:  Supple, no jugular venous distention. No thyroid enlargement, no tenderness.  LUNGS: Normal breath sounds bilaterally, no wheezing, rales,rhonchi or crepitation. No use of accessory muscles of respiration.  CARDIOVASCULAR: S1, S2 normal, tachycardia. No murmurs, rubs, or gallops.  ABDOMEN: Soft, nontender, nondistended. Bowel sounds present. No organomegaly or mass.  EXTREMITIES: No pedal edema, cyanosis, or clubbing.  NEUROLOGIC: Cranial nerves II through XII are intact. Muscle strength 5/5 in all extremities. Sensation intact. Gait not checked.  PSYCHIATRIC: The patient is alert and oriented x 3.  SKIN: No obvious rash, lesion, or ulcer.   Physical Exam LABORATORY PANEL:   CBC  Recent Labs Lab 08/24/16 0425  WBC 8.8  HGB 13.6  HCT 38.6*  PLT 284   ------------------------------------------------------------------------------------------------------------------  Chemistries   Recent Labs Lab 08/24/16 0425  NA 138  K 4.1  CL 106  CO2 23  GLUCOSE 171*  BUN 9  CREATININE 0.79  CALCIUM 9.7  AST 32  ALT 22  ALKPHOS 75  BILITOT 0.6   ------------------------------------------------------------------------------------------------------------------  Cardiac Enzymes  Recent Labs Lab 08/23/16 2250 08/24/16 0425  TROPONINI <0.03 <0.03   ------------------------------------------------------------------------------------------------------------------  RADIOLOGY:  Dg Chest 2 View  Result Date: 08/23/2016 CLINICAL DATA:  Shortness of breath, fatigue, diaphoresis, chest pain EXAM: CHEST  2 VIEW COMPARISON:  04/18/2014 FINDINGS: Upper normal heart size. Mediastinal contours and pulmonary vascularity normal. Minimal central peribronchial thickening. Linear scarring LEFT base. No infiltrate, pleural effusion or pneumothorax. Scattered degenerative disc disease changes thoracic  spine. IMPRESSION: Minimal bronchitic changes and LEFT  basilar scarring. No acute abnormalities. Electronically Signed   By: Lavonia Dana M.D.   On: 08/23/2016 13:10   Ct Angio Chest Pe W And/or Wo Contrast  Result Date: 08/23/2016 CLINICAL DATA:  Chest pain and shortness of Breath for 2 days. EXAM: CT ANGIOGRAPHY CHEST WITH CONTRAST TECHNIQUE: Multidetector CT imaging of the chest was performed using the standard protocol during bolus administration of intravenous contrast. Multiplanar CT image reconstructions and MIPs were obtained to evaluate the vascular anatomy. CONTRAST:  75 cc Isovue 370 COMPARISON:  CT scan 06/30/2015 FINDINGS: Chest wall: No chest wall mass, supraclavicular or axillary lymphadenopathy. Small scattered lymph nodes are noted. The thyroid gland is grossly Cardiovascular: The heart is normal in size. No pericardial effusion. The aorta is normal in caliber. No dissection. Scattered Coronary artery calcifications are noted. The pulmonary arterial tree is fairly well opacified. No filling defects to suggest pulmonary embolism. Mediastinum/Nodes: No mediastinal or hilar mass or adenopathy. Small scattered lymph nodes are noted. The esophagus is grossly normal. Lungs/Pleura: No acute pulmonary findings. No infiltrates, edema or effusions. Minimal dependent subpleural atelectasis. Stable 5.5 mm left upper lobe pulmonary nodule on image number 38. This is stable since 2014 is considered benign. No new pulmonary lesions. No interstitial lung disease or bronchiectasis. Upper Abdomen: No significant findings. Musculoskeletal: Stable advanced degenerative changes involving the thoracic spine and exaggerated kyphosis. No acute bony findings. Stable lateral abdominal wall hernia on the left. Review of the MIP images confirms the above findings. IMPRESSION: 1. No CT findings for pulmonary embolism. 2. Normal thoracic aorta. 3. Stable Coronary artery calcifications. 4. No mediastinal or hilar mass or  adenopathy. 5. No acute pulmonary findings. Electronically Signed   By: Marijo Sanes M.D.   On: 08/23/2016 14:21   US Carotid Bilateral  Result Date: 08/24/2016 CLINICAL DATA:  Syncope. EXAM: BILATERAL CAROTID DUPLEX ULTRASOUND TECHNIQUE: Pearline Cables scale imaging, color Doppler and duplex ultrasound were performed of bilateral carotid and vertebral arteries in the neck. COMPARISON:  No recent prior. FINDINGS: Criteria: Quantification of carotid stenosis is based on velocity parameters that correlate the residual internal carotid diameter with NASCET-based stenosis levels, using the diameter of the distal internal carotid lumen as the denominator for stenosis measurement. The following velocity measurements were obtained: RIGHT ICA:  70/18 cm/sec CCA:  [id number] cm/sec SYSTOLIC ICA/CCA RATIO:  0.5 DIASTOLIC ICA/CCA RATIO:  0.8 ECA:  156 cm/sec LEFT ICA:  103/19 cm/sec CCA:  [id number] cm/sec SYSTOLIC ICA/CCA RATIO:  0.7 DIASTOLIC ICA/CCA RATIO:  0.5 ECA:  169 cm/sec RIGHT CAROTID ARTERY: Mild plaque right carotid bifurcation and proximal ICA. No flow limiting stenosis. RIGHT VERTEBRAL ARTERY:  Patent with antegrade flow. LEFT CAROTID ARTERY: Mild plaque left carotid bifurcation. No flow limiting stenosis . LEFT VERTEBRAL ARTERY:  Patent antegrade flow. IMPRESSION: 1. Mild bilateral carotid bifurcation and proximal right ICA atherosclerotic vascular plaque. No flow limiting stenosis. Degree of stenosis less than 50% bilaterally. 2. Vertebral arteries are patent with antegrade flow. Electronically Signed   By: Marcello Moores  Register   On: 08/24/2016 09:27    ASSESSMENT AND PLAN:   Principal Problem:   Chest pain Active Problems:   Chronic pain   HLD (hyperlipidemia)   Near syncope  * Near Syncopal episode with the shortness of breath and chest tightness   Sinus Tachycardia noted on telemetry, blood pressure stable.   Troponins negative.   Ordered TSH which is normal.   Ordered coreg for him.   Seen by cardiologist  and as per nurse he  requested stress test for tomorrow morning.  * Anxiety   On alprazolam    * Hypertension   Coreg, benazepril.  * Hx of DVT   Cont warfarin. No PE in CT chest.    All the records are reviewed and case discussed with Care Management/Social Workerr. Management plans discussed with the patient, family and they are in agreement.  CODE STATUS: full.  TOTAL TIME TAKING CARE OF THIS PATIENT: 35 minutes.   POSSIBLE D/C IN 1 DAYS, DEPENDING ON CLINICAL CONDITION.   Vaughan Basta M.D on 08/24/2016   Between 7am to 6pm - Pager - [phone]  After 6pm go to [email or web address] - password [email or web address] [facility]  Office  [phone]  CC: Primary care physician; [facility], Chrissie Noa, MD  Note: This dictation was prepared with Dragon dictation along with smaller phrase technology. Any transcriptional errors that result from this process are unintentional.

## 2016-08-24 NOTE — Care Management (Addendum)
Met with patient at bedside. He is sitting up in chair at bedside. Alert, oriented and mobile. Lives at home with spouse. PCP is Dr. Wadie Lessen. Troponin negative.  Plan is stress test tomorrow. It is anticipated that patient will discharge after stress test tomorrow if negative. Patient denies issues obtaining medical care, transportation or copays.

## 2016-08-24 NOTE — Progress Notes (Signed)
Echo showed normal lv function with no significant wall motion or valvular abnormalities. Pt defers inpatient functional study. He has ruled out for mi and has good lv function on echo. OK for discharge on current meds for outpatient follow up for further noninvasive tested if needed.  Continue with benazepril 10 mg daily, carvedolol t6.25 mg bid; and warfarin with inr gal of 2-3.

## 2016-08-24 NOTE — Progress Notes (Signed)
  Echocardiogram 2D Echocardiogram with Definity has been performed.  Jennette Dubin 08/24/2016, 2:41 PM

## 2016-08-24 NOTE — Progress Notes (Signed)
Discharge instructions explained to pt/ verbalized an understanding/ iv and tele removed/ rx given to pt/ will transport off unit via wheelchair.  

## 2016-08-25 ENCOUNTER — Ambulatory Visit: Payer: Commercial Managed Care - HMO

## 2016-08-25 NOTE — Discharge Summary (Signed)
[facility] NAME: Adrian [name]    MR#:  [id number]  DATE OF BIRTH:  05/13/1946  DATE OF ADMISSION:  08/23/2016 ADMITTING PHYSICIAN: Idelle Crouch, MD  DATE OF DISCHARGE: 08/24/2016  4:13 PM  PRIMARY CARE PHYSICIAN: FELDPAUSCH, DALE E, MD    ADMISSION DIAGNOSIS:  Shortness of breath [R06.02] Syncope [R55] Near syncope [R55] Chest pain, unspecified chest pain type [R07.9]  DISCHARGE DIAGNOSIS:  Principal Problem:   Chest pain Active Problems:   Chronic pain   HLD (hyperlipidemia)   Near syncope   SECONDARY DIAGNOSIS:   Past Medical History:  Diagnosis Date  . Anxiety   . Arthritis    rheumatoid  . Basal cell carcinoma of skin    skin  . Hypertension   . Lumbar radiculopathy 09/25/2014  . Pulmonary embolism ([place]) 05/25/2014   Overview:  Times 2 on anticoagulation a.  Times two.   b.  Filter placed.    Last Assessment & Plan:  Times 2 on anticoagulation a.  Times two.   b.  Filter placed. No current bleeding noted.    . Reflux     HOSPITAL COURSE:   * Near Syncopal episode with the shortness of breath and chest tightness   Sinus Tachycardia noted on telemetry, blood pressure stable.   Troponins negative.   Ordered TSH which is normal. Echo done and reviewed.   Ordered coreg for him.   Seen by cardiologist and he requested stress test but pt wanted to have the work up as out pt.   So he was discharged with further follow up advise.  * Anxiety   On alprazolam    * Hypertension   Coreg, benazepril.  * Hx of DVT   Cont warfarin. No PE in CT chest.  DISCHARGE CONDITIONS:   Stable.  CONSULTS OBTAINED:  Treatment Team:  Yolonda Kida, MD Teodoro Spray, MD  DRUG ALLERGIES:   Allergies  Allergen Reactions  . Duloxetine     Other reaction(s): Hallucination  . Pseudoephedrine Hcl     Other reaction(s): Other (See Comments) Other Reaction: ELEVATED BLOOD PRESSURE  . Shellfish Allergy  Swelling    Uncoded Allergy. Allergen: seafood, Other Reaction: "THROAT CLOSING" Uncoded Allergy. Allergen: seafood, Other Reaction: "THROAT CLOSING"    DISCHARGE MEDICATIONS:   Discharge Medication List as of 08/24/2016  3:44 PM    START taking these medications   Details  ALPRAZolam (XANAX) 0.25 MG tablet Take 1 tablet (0.25 mg total) by mouth 3 (three) times daily as needed for anxiety., Starting Tue 08/24/2016, Print    carvedilol (COREG) 6.25 MG tablet Take 1 tablet (6.25 mg total) by mouth 2 (two) times daily with a meal., Starting Tue 08/24/2016, Print      CONTINUE these medications which have NOT CHANGED   Details  acetaminophen (TYLENOL) 500 MG tablet Take 1,000 mg by mouth every 6 (six) hours as needed., Until Discontinued, Historical Med    allopurinol (ZYLOPRIM) 300 MG tablet Take 300 mg by mouth daily., Until Discontinued, Historical Med    benazepril (LOTENSIN) 10 MG tablet Take 10 mg by mouth daily. Reported on 03/08/2016, Until Discontinued, Historical Med    Multiple Vitamin (MULTIVITAMIN) tablet Take 1 tablet by mouth daily., Until Discontinued, Historical Med    pantoprazole (PROTONIX) 40 MG tablet Take 40 mg by mouth daily., Until Discontinued, Historical Med    potassium chloride (K-DUR) 10 MEQ tablet Take 10 mEq by mouth daily., Until  Discontinued, Historical Med    torsemide (DEMADEX) 10 MG tablet Take 10 mg by mouth daily., Until Discontinued, Historical Med    vitamin B-12 (CYANOCOBALAMIN) 500 MCG tablet 1,000 mcg daily. , Starting 01/30/2015, Until Discontinued, Historical Med    warfarin (COUMADIN) 6 MG tablet Take 6 mg by mouth at bedtime. , Historical Med         DISCHARGE INSTRUCTIONS:    Follow with Cardiology clinic in 1-2 weeks.  If you experience worsening of your admission symptoms, develop shortness of breath, life threatening emergency, suicidal or homicidal thoughts you must seek medical attention immediately by calling 911 or calling your  MD immediately  if symptoms less severe.  You Must read complete instructions/literature along with all the possible adverse reactions/side effects for all the Medicines you take and that have been prescribed to you. Take any new Medicines after you have completely understood and accept all the possible adverse reactions/side effects.   Please note  You were cared for by a hospitalist during your hospital stay. If you have any questions about your discharge medications or the care you received while you were in the hospital after you are discharged, you can call the unit and asked to speak with the hospitalist on call if the hospitalist that took care of you is not available. Once you are discharged, your primary care physician will handle any further medical issues. Please note that NO REFILLS for any discharge medications will be authorized once you are discharged, as it is imperative that you return to your primary care physician (or establish a relationship with a primary care physician if you do not have one) for your aftercare needs so that they can reassess your need for medications and monitor your lab values.    Today   CHIEF COMPLAINT:   Chief Complaint  Patient presents with  . Shortness of Breath  . Chest Pain    HISTORY OF PRESENT ILLNESS:  Adrian [name]  is a 70 y.o. male with a known history of RA, neuropathy, and chronic pain now with episodes of chest pain described as tightness associated with SOB, dizziness, and near syncope. No cardiac hx. Currently pain-free. In ER, EKG and troponin OK. He is now admitted.  VITAL SIGNS:  Blood pressure (!) 174/80, pulse (!) 102, temperature 98.5 F (36.9 C), temperature source Oral, resp. rate (!) 22, height 6' (1.829 m), weight 133 kg (293 lb 4.8 oz), SpO2 99 %.  I/O:  No intake or output data in the 24 hours ending 08/25/16 1255  PHYSICAL EXAMINATION:   GENERAL:  70 y.o.-year-old patient lying in the bed with no acute distress.   EYES: Pupils equal, round, reactive to light and accommodation. No scleral icterus. Extraocular muscles intact.  HEENT: Head atraumatic, normocephalic. Oropharynx and nasopharynx clear.  NECK:  Supple, no jugular venous distention. No thyroid enlargement, no tenderness.  LUNGS: Normal breath sounds bilaterally, no wheezing, rales,rhonchi or crepitation. No use of accessory muscles of respiration.  CARDIOVASCULAR: S1, S2 normal, tachycardia. No murmurs, rubs, or gallops.  ABDOMEN: Soft, nontender, nondistended. Bowel sounds present. No organomegaly or mass.  EXTREMITIES: No pedal edema, cyanosis, or clubbing.  NEUROLOGIC: Cranial nerves II through XII are intact. Muscle strength 5/5 in all extremities. Sensation intact. Gait not checked.  PSYCHIATRIC: The patient is alert and oriented x 3.  SKIN: No obvious rash, lesion, or ulcer.   DATA REVIEW:   CBC  Recent Labs Lab 08/24/16 0425  WBC 8.8  HGB 13.6  HCT 38.6*  PLT 284    Chemistries   Recent Labs Lab 08/24/16 0425  NA 138  K 4.1  CL 106  CO2 23  GLUCOSE 171*  BUN 9  CREATININE 0.79  CALCIUM 9.7  AST 32  ALT 22  ALKPHOS 75  BILITOT 0.6    Cardiac Enzymes  Recent Labs Lab 08/24/16 0425  TROPONINI <0.03    Microbiology Results  No results found for this or any previous visit.  RADIOLOGY:  Dg Chest 2 View  Result Date: 08/23/2016 CLINICAL DATA:  Shortness of breath, fatigue, diaphoresis, chest pain EXAM: CHEST  2 VIEW COMPARISON:  04/18/2014 FINDINGS: Upper normal heart size. Mediastinal contours and pulmonary vascularity normal. Minimal central peribronchial thickening. Linear scarring LEFT base. No infiltrate, pleural effusion or pneumothorax. Scattered degenerative disc disease changes thoracic spine. IMPRESSION: Minimal bronchitic changes and LEFT basilar scarring. No acute abnormalities. Electronically Signed   By: Lavonia Dana M.D.   On: 08/23/2016 13:10   Ct Angio Chest Pe W And/or Wo Contrast  Result  Date: 08/23/2016 CLINICAL DATA:  Chest pain and shortness of Breath for 2 days. EXAM: CT ANGIOGRAPHY CHEST WITH CONTRAST TECHNIQUE: Multidetector CT imaging of the chest was performed using the standard protocol during bolus administration of intravenous contrast. Multiplanar CT image reconstructions and MIPs were obtained to evaluate the vascular anatomy. CONTRAST:  75 cc Isovue 370 COMPARISON:  CT scan 06/30/2015 FINDINGS: Chest wall: No chest wall mass, supraclavicular or axillary lymphadenopathy. Small scattered lymph nodes are noted. The thyroid gland is grossly Cardiovascular: The heart is normal in size. No pericardial effusion. The aorta is normal in caliber. No dissection. Scattered Coronary artery calcifications are noted. The pulmonary arterial tree is fairly well opacified. No filling defects to suggest pulmonary embolism. Mediastinum/Nodes: No mediastinal or hilar mass or adenopathy. Small scattered lymph nodes are noted. The esophagus is grossly normal. Lungs/Pleura: No acute pulmonary findings. No infiltrates, edema or effusions. Minimal dependent subpleural atelectasis. Stable 5.5 mm left upper lobe pulmonary nodule on image number 38. This is stable since 2014 is considered benign. No new pulmonary lesions. No interstitial lung disease or bronchiectasis. Upper Abdomen: No significant findings. Musculoskeletal: Stable advanced degenerative changes involving the thoracic spine and exaggerated kyphosis. No acute bony findings. Stable lateral abdominal wall hernia on the left. Review of the MIP images confirms the above findings. IMPRESSION: 1. No CT findings for pulmonary embolism. 2. Normal thoracic aorta. 3. Stable Coronary artery calcifications. 4. No mediastinal or hilar mass or adenopathy. 5. No acute pulmonary findings. Electronically Signed   By: Marijo Sanes M.D.   On: 08/23/2016 14:21   US Carotid Bilateral  Result Date: 08/24/2016 CLINICAL DATA:  Syncope. EXAM: BILATERAL CAROTID DUPLEX  ULTRASOUND TECHNIQUE: Pearline Cables scale imaging, color Doppler and duplex ultrasound were performed of bilateral carotid and vertebral arteries in the neck. COMPARISON:  No recent prior. FINDINGS: Criteria: Quantification of carotid stenosis is based on velocity parameters that correlate the residual internal carotid diameter with NASCET-based stenosis levels, using the diameter of the distal internal carotid lumen as the denominator for stenosis measurement. The following velocity measurements were obtained: RIGHT ICA:  70/18 cm/sec CCA:  [id number] cm/sec SYSTOLIC ICA/CCA RATIO:  0.5 DIASTOLIC ICA/CCA RATIO:  0.8 ECA:  156 cm/sec LEFT ICA:  103/19 cm/sec CCA:  [id number] cm/sec SYSTOLIC ICA/CCA RATIO:  0.7 DIASTOLIC ICA/CCA RATIO:  0.5 ECA:  169 cm/sec RIGHT CAROTID ARTERY: Mild plaque right carotid bifurcation and proximal ICA. No flow limiting stenosis. RIGHT VERTEBRAL ARTERY:  Patent with antegrade flow. LEFT CAROTID ARTERY: Mild plaque left carotid bifurcation. No flow limiting stenosis . LEFT VERTEBRAL ARTERY:  Patent antegrade flow. IMPRESSION: 1. Mild bilateral carotid bifurcation and proximal right ICA atherosclerotic vascular plaque. No flow limiting stenosis. Degree of stenosis less than 50% bilaterally. 2. Vertebral arteries are patent with antegrade flow. Electronically Signed   By: Marcello Moores  Register   On: 08/24/2016 09:27    EKG:   Orders placed or performed during the hospital encounter of 08/23/16  . ED EKG within 10 minutes  . ED EKG within 10 minutes      Management plans discussed with the patient, family and they are in agreement.  CODE STATUS:  Code Status History    Date Active Date Inactive Code Status Order ID Comments User Context   08/23/2016  5:00 PM 08/24/2016  7:31 AM Full Code [id number]  Idelle Crouch, MD Inpatient      TOTAL TIME TAKING CARE OF THIS PATIENT: 35 minutes.    Vaughan Basta M.D on 08/25/2016 at 12:55 PM  Between 7am to 6pm - Pager - [phone]  After  6pm go to [email or web address] - password [email or web address] [facility]  Office  [phone]  CC: Primary care physician; [facility], Chrissie Noa, MD   Note: This dictation was prepared with Dragon dictation along with smaller phrase technology. Any transcriptional errors that result from this process are unintentional.

## 2016-09-01 DIAGNOSIS — F3341 Major depressive disorder, recurrent, in partial remission: Secondary | ICD-10-CM | POA: Diagnosis not present

## 2016-09-01 DIAGNOSIS — K219 Gastro-esophageal reflux disease without esophagitis: Secondary | ICD-10-CM | POA: Diagnosis not present

## 2016-09-01 DIAGNOSIS — G629 Polyneuropathy, unspecified: Secondary | ICD-10-CM | POA: Diagnosis not present

## 2016-09-08 DIAGNOSIS — M5416 Radiculopathy, lumbar region: Secondary | ICD-10-CM | POA: Diagnosis not present

## 2016-09-08 DIAGNOSIS — M545 Low back pain: Secondary | ICD-10-CM | POA: Diagnosis not present

## 2016-09-08 DIAGNOSIS — M5126 Other intervertebral disc displacement, lumbar region: Secondary | ICD-10-CM | POA: Diagnosis not present

## 2016-09-08 DIAGNOSIS — M4806 Spinal stenosis, lumbar region: Secondary | ICD-10-CM | POA: Diagnosis not present

## 2016-09-27 DIAGNOSIS — Z7901 Long term (current) use of anticoagulants: Secondary | ICD-10-CM | POA: Diagnosis not present

## 2016-10-04 DIAGNOSIS — M1711 Unilateral primary osteoarthritis, right knee: Secondary | ICD-10-CM | POA: Diagnosis not present

## 2016-10-05 DIAGNOSIS — M48062 Spinal stenosis, lumbar region with neurogenic claudication: Secondary | ICD-10-CM | POA: Diagnosis not present

## 2016-10-05 DIAGNOSIS — G629 Polyneuropathy, unspecified: Secondary | ICD-10-CM | POA: Diagnosis not present

## 2016-10-13 DIAGNOSIS — Z23 Encounter for immunization: Secondary | ICD-10-CM | POA: Diagnosis not present

## 2016-10-13 DIAGNOSIS — K219 Gastro-esophageal reflux disease without esophagitis: Secondary | ICD-10-CM | POA: Diagnosis not present

## 2016-10-13 DIAGNOSIS — F3341 Major depressive disorder, recurrent, in partial remission: Secondary | ICD-10-CM | POA: Diagnosis not present

## 2016-10-28 DIAGNOSIS — Z7901 Long term (current) use of anticoagulants: Secondary | ICD-10-CM | POA: Diagnosis not present

## 2016-11-02 DIAGNOSIS — M48062 Spinal stenosis, lumbar region with neurogenic claudication: Secondary | ICD-10-CM | POA: Diagnosis not present

## 2016-11-15 DIAGNOSIS — Z7901 Long term (current) use of anticoagulants: Secondary | ICD-10-CM | POA: Diagnosis not present

## 2016-12-01 DIAGNOSIS — Z7901 Long term (current) use of anticoagulants: Secondary | ICD-10-CM | POA: Diagnosis not present

## 2016-12-22 DIAGNOSIS — Z7901 Long term (current) use of anticoagulants: Secondary | ICD-10-CM | POA: Diagnosis not present

## 2017-01-02 ENCOUNTER — Ambulatory Visit
Admission: EM | Admit: 2017-01-02 | Discharge: 2017-01-02 | Disposition: A | Discharge status: Home / Self Care | Payer: Commercial Managed Care - HMO | Attending: Emergency Medicine | Admitting: Emergency Medicine

## 2017-01-02 DIAGNOSIS — L03119 Cellulitis of unspecified part of limb: Secondary | ICD-10-CM | POA: Diagnosis not present

## 2017-01-02 MED ORDER — MUPIROCIN 2 % EX OINT: 1.0000 "application " | TOPICAL_OINTMENT | Freq: Three times a day (TID) | CUTANEOUS | 0 refills | Status: DC

## 2017-01-02 MED ORDER — CHLORHEXIDINE GLUCONATE 4 % EX LIQD: Freq: Every day | CUTANEOUS | 0 refills | Status: DC | PRN

## 2017-01-02 MED ORDER — DOXYCYCLINE HYCLATE 100 MG PO CAPS: 100.0000 mg | ORAL_CAPSULE | Freq: Two times a day (BID) | ORAL | 0 refills | Status: AC

## 2017-01-02 NOTE — Discharge Instructions (Signed)
Call your doctor or the Coumadin clinic tomorrow and let them know that you have been started on doxycycline and Bactroban for skin infection so that they can adjust your Coumadin. Do follow-up with the Coumadin clinic on Wednesday as scheduled. Warm compresses to your knee.

## 2017-01-02 NOTE — ED Triage Notes (Signed)
Pt c/o sores/blisters on the inside of his right leg and on his left knee. They appear to be blisters that have busted, and the area around them is red and inflamed. He noticed them about a week ago and it continues to get worse.

## 2017-01-02 NOTE — ED Provider Notes (Signed)
HPI  SUBJECTIVE:  Adrian [name] is a 71 y.o. male who presents with a blood blister on his inner right thigh starting a week ago. He states that it burst on its own and now he has blisters all along his right upper inner thigh. He also reports a painful, erythematous "bump" on his left knee. Describes the pain as constant, sore. He states that he had some itching on his right upper medial thigh last night and thought he was getting better but noticed worsening of his symptoms this morning. He tried Neosporin, Band-Aids, hydrocodone last night and started himself on amoxicillin 500 mg 1 this morning. There are no alleviating factors, symptoms are worse with rubbing it. He denies fevers, trauma to his legs thighs, buttock bites. No symptoms like this before. He has a past medical history of PE 2 status post IVC placement, he is currently on warfarin 6 mg daily. He had an INR checked 2 weeks ago, states it was "high". He was instructed to skip a dose of warfarin and resume the next day. He has follow-up with the Coumadin clinic this Wednesday. He also has a past medical history of neuropathy. No history of MRSA, skin infections, boils, diabetes, hypertension. PMD: [facility], Chrissie Noa, MD   Past Medical History:  Diagnosis Date  . Anxiety   . Arthritis    rheumatoid  . Basal cell carcinoma of skin    skin  . Hypertension   . Lumbar radiculopathy 09/25/2014  . Pulmonary embolism ([place]) 05/25/2014   Overview:  Times 2 on anticoagulation a.  Times two.   b.  Filter placed.    Last Assessment & Plan:  Times 2 on anticoagulation a.  Times two.   b.  Filter placed. No current bleeding noted.    . Reflux     Past Surgical History:  Procedure Laterality Date  . HERNIA REPAIR      Family History  Problem Relation Age of Onset  . Diabetes Mother   . Cancer Father     Social History  Substance Use Topics  . Smoking status: Light Tobacco Smoker    Packs/day: 0.00    Years: 54.00  . Smokeless  tobacco: Never Used  . Alcohol use 0.0 oz/week     Comment: rare    No current facility-administered medications for this encounter.   Current Outpatient Prescriptions:  .  acetaminophen (TYLENOL) 500 MG tablet, Take 1,000 mg by mouth every 6 (six) hours as needed., Disp: , Rfl:  .  allopurinol (ZYLOPRIM) 300 MG tablet, Take 300 mg by mouth daily., Disp: , Rfl:  .  ALPRAZolam (XANAX) 0.25 MG tablet, Take 1 tablet (0.25 mg total) by mouth 3 (three) times daily as needed for anxiety., Disp: 20 tablet, Rfl: 0 .  benazepril (LOTENSIN) 10 MG tablet, Take 10 mg by mouth daily. Reported on 03/08/2016, Disp: , Rfl:  .  carvedilol (COREG) 6.25 MG tablet, Take 1 tablet (6.25 mg total) by mouth 2 (two) times daily with a meal., Disp: 60 tablet, Rfl: 0 .  Multiple Vitamin (MULTIVITAMIN) tablet, Take 1 tablet by mouth daily., Disp: , Rfl:  .  pantoprazole (PROTONIX) 40 MG tablet, Take 40 mg by mouth daily., Disp: , Rfl:  .  potassium chloride (K-DUR) 10 MEQ tablet, Take 10 mEq by mouth daily., Disp: , Rfl:  .  torsemide (DEMADEX) 10 MG tablet, Take 10 mg by mouth daily., Disp: , Rfl:  .  vitamin B-12 (CYANOCOBALAMIN) 500 MCG tablet, 1,000 mcg  daily. , Disp: , Rfl:  .  warfarin (COUMADIN) 6 MG tablet, Take 6 mg by mouth at bedtime. , Disp: , Rfl:  .  chlorhexidine (HIBICLENS) 4 % external liquid, Apply topically daily as needed. Dilute 10-15 mL in water, Use daily when bathing for 1-2 weeks, Disp: 120 mL, Rfl: 0 .  doxycycline (VIBRAMYCIN) 100 MG capsule, Take 1 capsule (100 mg total) by mouth 2 (two) times daily., Disp: 14 capsule, Rfl: 0 .  mupirocin ointment (BACTROBAN) 2 %, Apply 1 application topically 3 (three) times daily., Disp: 22 g, Rfl: 0  Allergies  Allergen Reactions  . Duloxetine     Other reaction(s): Hallucination  . Pseudoephedrine Hcl     Other reaction(s): Other (See Comments) Other Reaction: ELEVATED BLOOD PRESSURE  . Shellfish Allergy Swelling    Uncoded Allergy. Allergen:  seafood, Other Reaction: "THROAT CLOSING" Uncoded Allergy. Allergen: seafood, Other Reaction: "THROAT CLOSING"     ROS  As noted in HPI.   Physical Exam  BP 136/73 (BP Location: Left Arm)   Pulse 65   Temp 98.2 F (36.8 C) (Oral)   Resp 18   SpO2 97%   Constitutional: Well developed, well nourished, no acute distress Eyes:  EOMI, conjunctiva normal bilaterally HENT: Normocephalic, atraumatic,mucus membranes moist Respiratory: Normal inspiratory effort Cardiovascular: Normal rate GI: nondistended skin: 3 x 2 cm tender area of erythema, induration left lateral knee. Positive mildly tender indurated area of right medial thigh with several ulcerations and flaccid blister. Erythema measuring 8 x 9 cm. No expressable purulent drainage from either of these areas. Marked both areas of erythema with a marker for reference. See pictures         Musculoskeletal: no deformities Neurologic: Alert & oriented x 3, no focal neuro deficits Psychiatric: Speech and behavior appropriate   ED Course   Medications - No data to display  No orders of the defined types were placed in this encounter.   No results found for this or any previous visit (from the past 24 hour(s)). No results found.  ED Clinical Impression  Cellulitis of lower extremity, unspecified laterality   ED Assessment/Plan  Presentation most consistent with a staph, possibly MRSA infection. Does not appear to be anything to I&D at this time. Plan to send home with doxy and Bactroban. He already has pain medicine at home. We'll advise him to contact his PMD with the Coumadin clinic so they can adjust this antibiotic.  Discussed MDM, plan and followup with patient . Discussed sn/sx that should prompt return to the ED. Patient agrees with plan.   Meds ordered this encounter  Medications  . doxycycline (VIBRAMYCIN) 100 MG capsule    Sig: Take 1 capsule (100 mg total) by mouth 2 (two) times daily.    Dispense:  14  capsule    Refill:  0  . chlorhexidine (HIBICLENS) 4 % external liquid    Sig: Apply topically daily as needed. Dilute 10-15 mL in water, Use daily when bathing for 1-2 weeks    Dispense:  120 mL    Refill:  0  . mupirocin ointment (BACTROBAN) 2 %    Sig: Apply 1 application topically 3 (three) times daily.    Dispense:  22 g    Refill:  0    *This clinic note was created using [employer]. Therefore, there may be occasional mistakes despite careful proofreading.  ?   Melynda Ripple, MD 01/02/17 1714

## 2017-01-07 DIAGNOSIS — L03115 Cellulitis of right lower limb: Secondary | ICD-10-CM | POA: Diagnosis not present

## 2017-01-07 DIAGNOSIS — B356 Tinea cruris: Secondary | ICD-10-CM | POA: Diagnosis not present

## 2017-01-07 DIAGNOSIS — G629 Polyneuropathy, unspecified: Secondary | ICD-10-CM | POA: Diagnosis not present

## 2017-01-07 DIAGNOSIS — Z7901 Long term (current) use of anticoagulants: Secondary | ICD-10-CM | POA: Diagnosis not present

## 2017-01-13 DIAGNOSIS — E7800 Pure hypercholesterolemia, unspecified: Secondary | ICD-10-CM | POA: Diagnosis not present

## 2017-01-13 DIAGNOSIS — I10 Essential (primary) hypertension: Secondary | ICD-10-CM | POA: Diagnosis not present

## 2017-01-13 DIAGNOSIS — F3341 Major depressive disorder, recurrent, in partial remission: Secondary | ICD-10-CM | POA: Diagnosis not present

## 2017-01-13 DIAGNOSIS — J449 Chronic obstructive pulmonary disease, unspecified: Secondary | ICD-10-CM | POA: Diagnosis not present

## 2017-01-13 DIAGNOSIS — K219 Gastro-esophageal reflux disease without esophagitis: Secondary | ICD-10-CM | POA: Diagnosis not present

## 2017-01-13 DIAGNOSIS — R7302 Impaired glucose tolerance (oral): Secondary | ICD-10-CM | POA: Diagnosis not present

## 2017-01-13 DIAGNOSIS — G629 Polyneuropathy, unspecified: Secondary | ICD-10-CM | POA: Diagnosis not present

## 2017-01-13 DIAGNOSIS — M1A00X0 Idiopathic chronic gout, unspecified site, without tophus (tophi): Secondary | ICD-10-CM | POA: Diagnosis not present

## 2017-01-18 DIAGNOSIS — Z7901 Long term (current) use of anticoagulants: Secondary | ICD-10-CM | POA: Diagnosis not present

## 2017-01-18 DIAGNOSIS — E7800 Pure hypercholesterolemia, unspecified: Secondary | ICD-10-CM | POA: Diagnosis not present

## 2017-01-18 DIAGNOSIS — R7302 Impaired glucose tolerance (oral): Secondary | ICD-10-CM | POA: Diagnosis not present

## 2017-01-27 DIAGNOSIS — Z7901 Long term (current) use of anticoagulants: Secondary | ICD-10-CM | POA: Diagnosis not present

## 2017-02-01 DIAGNOSIS — M5416 Radiculopathy, lumbar region: Secondary | ICD-10-CM | POA: Diagnosis not present

## 2017-02-01 DIAGNOSIS — M48062 Spinal stenosis, lumbar region with neurogenic claudication: Secondary | ICD-10-CM | POA: Diagnosis not present

## 2017-02-01 DIAGNOSIS — M5126 Other intervertebral disc displacement, lumbar region: Secondary | ICD-10-CM | POA: Diagnosis not present

## 2017-02-01 DIAGNOSIS — Z7901 Long term (current) use of anticoagulants: Secondary | ICD-10-CM | POA: Diagnosis not present

## 2017-02-07 DIAGNOSIS — M5414 Radiculopathy, thoracic region: Secondary | ICD-10-CM | POA: Diagnosis not present

## 2017-02-07 DIAGNOSIS — M9904 Segmental and somatic dysfunction of sacral region: Secondary | ICD-10-CM | POA: Diagnosis not present

## 2017-02-07 DIAGNOSIS — M9903 Segmental and somatic dysfunction of lumbar region: Secondary | ICD-10-CM | POA: Diagnosis not present

## 2017-02-07 DIAGNOSIS — M533 Sacrococcygeal disorders, not elsewhere classified: Secondary | ICD-10-CM | POA: Diagnosis not present

## 2017-02-08 DIAGNOSIS — M9903 Segmental and somatic dysfunction of lumbar region: Secondary | ICD-10-CM | POA: Diagnosis not present

## 2017-02-08 DIAGNOSIS — M5414 Radiculopathy, thoracic region: Secondary | ICD-10-CM | POA: Diagnosis not present

## 2017-02-08 DIAGNOSIS — M533 Sacrococcygeal disorders, not elsewhere classified: Secondary | ICD-10-CM | POA: Diagnosis not present

## 2017-02-08 DIAGNOSIS — M9904 Segmental and somatic dysfunction of sacral region: Secondary | ICD-10-CM | POA: Diagnosis not present

## 2017-02-09 DIAGNOSIS — Z7901 Long term (current) use of anticoagulants: Secondary | ICD-10-CM | POA: Diagnosis not present

## 2017-02-11 DIAGNOSIS — M533 Sacrococcygeal disorders, not elsewhere classified: Secondary | ICD-10-CM | POA: Diagnosis not present

## 2017-02-11 DIAGNOSIS — M9904 Segmental and somatic dysfunction of sacral region: Secondary | ICD-10-CM | POA: Diagnosis not present

## 2017-02-11 DIAGNOSIS — M9903 Segmental and somatic dysfunction of lumbar region: Secondary | ICD-10-CM | POA: Diagnosis not present

## 2017-02-11 DIAGNOSIS — M5414 Radiculopathy, thoracic region: Secondary | ICD-10-CM | POA: Diagnosis not present

## 2017-02-18 DIAGNOSIS — M5414 Radiculopathy, thoracic region: Secondary | ICD-10-CM | POA: Diagnosis not present

## 2017-02-18 DIAGNOSIS — M533 Sacrococcygeal disorders, not elsewhere classified: Secondary | ICD-10-CM | POA: Diagnosis not present

## 2017-02-18 DIAGNOSIS — M9904 Segmental and somatic dysfunction of sacral region: Secondary | ICD-10-CM | POA: Diagnosis not present

## 2017-02-18 DIAGNOSIS — M9903 Segmental and somatic dysfunction of lumbar region: Secondary | ICD-10-CM | POA: Diagnosis not present

## 2017-02-21 DIAGNOSIS — Z7901 Long term (current) use of anticoagulants: Secondary | ICD-10-CM | POA: Diagnosis not present

## 2017-02-22 DIAGNOSIS — M5136 Other intervertebral disc degeneration, lumbar region: Secondary | ICD-10-CM | POA: Diagnosis not present

## 2017-02-22 DIAGNOSIS — Z7901 Long term (current) use of anticoagulants: Secondary | ICD-10-CM | POA: Diagnosis not present

## 2017-02-22 DIAGNOSIS — M48062 Spinal stenosis, lumbar region with neurogenic claudication: Secondary | ICD-10-CM | POA: Diagnosis not present

## 2017-02-22 DIAGNOSIS — M5416 Radiculopathy, lumbar region: Secondary | ICD-10-CM | POA: Diagnosis not present

## 2017-03-07 DIAGNOSIS — Z7901 Long term (current) use of anticoagulants: Secondary | ICD-10-CM | POA: Diagnosis not present

## 2017-03-07 DIAGNOSIS — R5383 Other fatigue: Secondary | ICD-10-CM | POA: Diagnosis not present

## 2017-03-10 DIAGNOSIS — M1611 Unilateral primary osteoarthritis, right hip: Secondary | ICD-10-CM | POA: Diagnosis not present

## 2017-03-15 DIAGNOSIS — Z7901 Long term (current) use of anticoagulants: Secondary | ICD-10-CM | POA: Diagnosis not present

## 2017-03-29 DIAGNOSIS — Z7901 Long term (current) use of anticoagulants: Secondary | ICD-10-CM | POA: Diagnosis not present

## 2017-03-30 DIAGNOSIS — M5416 Radiculopathy, lumbar region: Secondary | ICD-10-CM | POA: Diagnosis not present

## 2017-03-30 DIAGNOSIS — M5136 Other intervertebral disc degeneration, lumbar region: Secondary | ICD-10-CM | POA: Diagnosis not present

## 2017-03-30 DIAGNOSIS — M48062 Spinal stenosis, lumbar region with neurogenic claudication: Secondary | ICD-10-CM | POA: Diagnosis not present

## 2017-04-11 DIAGNOSIS — Z7901 Long term (current) use of anticoagulants: Secondary | ICD-10-CM | POA: Diagnosis not present

## 2017-04-12 DIAGNOSIS — M1611 Unilateral primary osteoarthritis, right hip: Secondary | ICD-10-CM | POA: Diagnosis not present

## 2017-04-29 DIAGNOSIS — G894 Chronic pain syndrome: Secondary | ICD-10-CM | POA: Diagnosis not present

## 2017-04-29 DIAGNOSIS — I10 Essential (primary) hypertension: Secondary | ICD-10-CM | POA: Diagnosis not present

## 2017-05-19 DIAGNOSIS — G4733 Obstructive sleep apnea (adult) (pediatric): Secondary | ICD-10-CM | POA: Diagnosis not present

## 2017-05-30 DIAGNOSIS — M48062 Spinal stenosis, lumbar region with neurogenic claudication: Secondary | ICD-10-CM | POA: Diagnosis not present

## 2017-05-30 DIAGNOSIS — M5136 Other intervertebral disc degeneration, lumbar region: Secondary | ICD-10-CM | POA: Diagnosis not present

## 2017-05-30 DIAGNOSIS — M5416 Radiculopathy, lumbar region: Secondary | ICD-10-CM | POA: Diagnosis not present

## 2017-06-20 DIAGNOSIS — Z7901 Long term (current) use of anticoagulants: Secondary | ICD-10-CM | POA: Diagnosis not present

## 2017-06-27 DIAGNOSIS — Z7901 Long term (current) use of anticoagulants: Secondary | ICD-10-CM | POA: Diagnosis not present

## 2017-07-14 DIAGNOSIS — E7800 Pure hypercholesterolemia, unspecified: Secondary | ICD-10-CM | POA: Diagnosis not present

## 2017-07-14 DIAGNOSIS — K219 Gastro-esophageal reflux disease without esophagitis: Secondary | ICD-10-CM | POA: Diagnosis not present

## 2017-07-14 DIAGNOSIS — M1A00X0 Idiopathic chronic gout, unspecified site, without tophus (tophi): Secondary | ICD-10-CM | POA: Diagnosis not present

## 2017-07-14 DIAGNOSIS — I10 Essential (primary) hypertension: Secondary | ICD-10-CM | POA: Diagnosis not present

## 2017-07-14 DIAGNOSIS — J449 Chronic obstructive pulmonary disease, unspecified: Secondary | ICD-10-CM | POA: Diagnosis not present

## 2017-07-14 DIAGNOSIS — G629 Polyneuropathy, unspecified: Secondary | ICD-10-CM | POA: Diagnosis not present

## 2017-07-14 DIAGNOSIS — F3341 Major depressive disorder, recurrent, in partial remission: Secondary | ICD-10-CM | POA: Diagnosis not present

## 2017-07-14 DIAGNOSIS — R7302 Impaired glucose tolerance (oral): Secondary | ICD-10-CM | POA: Diagnosis not present

## 2017-07-20 DIAGNOSIS — Z7901 Long term (current) use of anticoagulants: Secondary | ICD-10-CM | POA: Diagnosis not present

## 2017-07-20 DIAGNOSIS — E7800 Pure hypercholesterolemia, unspecified: Secondary | ICD-10-CM | POA: Diagnosis not present

## 2017-07-20 DIAGNOSIS — R7302 Impaired glucose tolerance (oral): Secondary | ICD-10-CM | POA: Diagnosis not present

## 2017-07-20 DIAGNOSIS — M1A00X0 Idiopathic chronic gout, unspecified site, without tophus (tophi): Secondary | ICD-10-CM | POA: Diagnosis not present

## 2017-07-20 DIAGNOSIS — G629 Polyneuropathy, unspecified: Secondary | ICD-10-CM | POA: Diagnosis not present

## 2017-07-20 DIAGNOSIS — K219 Gastro-esophageal reflux disease without esophagitis: Secondary | ICD-10-CM | POA: Diagnosis not present

## 2017-07-20 DIAGNOSIS — F3341 Major depressive disorder, recurrent, in partial remission: Secondary | ICD-10-CM | POA: Diagnosis not present

## 2017-07-20 DIAGNOSIS — I10 Essential (primary) hypertension: Secondary | ICD-10-CM | POA: Diagnosis not present

## 2017-07-20 DIAGNOSIS — J449 Chronic obstructive pulmonary disease, unspecified: Secondary | ICD-10-CM | POA: Diagnosis not present

## 2017-07-21 DIAGNOSIS — M5416 Radiculopathy, lumbar region: Secondary | ICD-10-CM | POA: Diagnosis not present

## 2017-07-21 DIAGNOSIS — M5136 Other intervertebral disc degeneration, lumbar region: Secondary | ICD-10-CM | POA: Diagnosis not present

## 2017-07-21 DIAGNOSIS — M48062 Spinal stenosis, lumbar region with neurogenic claudication: Secondary | ICD-10-CM | POA: Diagnosis not present

## 2017-08-03 DIAGNOSIS — Z7901 Long term (current) use of anticoagulants: Secondary | ICD-10-CM | POA: Diagnosis not present

## 2017-08-04 DIAGNOSIS — Z7901 Long term (current) use of anticoagulants: Secondary | ICD-10-CM | POA: Diagnosis not present

## 2017-08-04 DIAGNOSIS — L918 Other hypertrophic disorders of the skin: Secondary | ICD-10-CM | POA: Diagnosis not present

## 2017-09-09 ENCOUNTER — Emergency Department: Payer: Medicare HMO

## 2017-09-09 ENCOUNTER — Encounter: Payer: Self-pay | Admitting: *Deleted

## 2017-09-09 DIAGNOSIS — Z85828 Personal history of other malignant neoplasm of skin: Secondary | ICD-10-CM | POA: Diagnosis not present

## 2017-09-09 DIAGNOSIS — Z86711 Personal history of pulmonary embolism: Secondary | ICD-10-CM | POA: Diagnosis not present

## 2017-09-09 DIAGNOSIS — I10 Essential (primary) hypertension: Secondary | ICD-10-CM | POA: Insufficient documentation

## 2017-09-09 DIAGNOSIS — Z79899 Other long term (current) drug therapy: Secondary | ICD-10-CM | POA: Insufficient documentation

## 2017-09-09 DIAGNOSIS — M79604 Pain in right leg: Secondary | ICD-10-CM | POA: Diagnosis not present

## 2017-09-09 DIAGNOSIS — Z7901 Long term (current) use of anticoagulants: Secondary | ICD-10-CM | POA: Insufficient documentation

## 2017-09-09 DIAGNOSIS — G8929 Other chronic pain: Secondary | ICD-10-CM | POA: Insufficient documentation

## 2017-09-09 DIAGNOSIS — M25561 Pain in right knee: Secondary | ICD-10-CM | POA: Diagnosis not present

## 2017-09-09 DIAGNOSIS — F17210 Nicotine dependence, cigarettes, uncomplicated: Secondary | ICD-10-CM | POA: Insufficient documentation

## 2017-09-09 DIAGNOSIS — I739 Peripheral vascular disease, unspecified: Secondary | ICD-10-CM | POA: Insufficient documentation

## 2017-09-09 LAB — PROTIME-INR
INR: 1.8
PROTHROMBIN TIME: 20.7 s — AB (ref 11.4–15.2)

## 2017-09-09 NOTE — ED Triage Notes (Signed)
Pt presents w/ 1 week hx of a knot behind R knee and pain. Pt states PMH of bilateral PE's and has been on and off warfarin for past 3-4 weeks due to dental surgery.

## 2017-09-10 ENCOUNTER — Emergency Department
Admission: EM | Admit: 2017-09-10 | Discharge: 2017-09-10 | Disposition: A | Discharge status: Home / Self Care | Payer: Medicare HMO | Attending: Emergency Medicine | Admitting: Emergency Medicine

## 2017-09-10 DIAGNOSIS — M79604 Pain in right leg: Secondary | ICD-10-CM

## 2017-09-10 NOTE — ED Notes (Signed)
ED Provider at bedside. 

## 2017-09-10 NOTE — ED Provider Notes (Signed)
[facility] Emergency Department Provider Note  ____________________________________________   First MD Initiated Contact with Patient 09/10/17 0148     (approximate)  I have reviewed the triage vital signs and the nursing notes.   HISTORY  Chief Complaint Claudication   HPI Adrian [name] is a 71 y.o. male with a history of PE on Coumadin who is presenting to the emergency department with right posterior knee pain. He says that he has been off and on his Coumadin for dental procedures over the past several weeks. He says that he had aching to the right posterior and lateral area of his knee this evening and his wife thought that she noticed some swelling. He does not report any injury. Says it is pain-free at this time but was concerned that he developed DVT because of this off and on regimen to his Coumadin lately. He says that he has now restarted his Coumadin as of 3-4 days ago.   Past Medical History:  Diagnosis Date  . Anxiety   . Arthritis    rheumatoid  . Basal cell carcinoma of skin    skin  . Hypertension   . Lumbar radiculopathy 09/25/2014  . Pulmonary embolism ([place]) 05/25/2014   Overview:  Times 2 on anticoagulation a.  Times two.   b.  Filter placed.    Last Assessment & Plan:  Times 2 on anticoagulation a.  Times two.   b.  Filter placed. No current bleeding noted.    . Reflux     Patient Active Problem List   Diagnosis Date Noted  . Chest pain 08/23/2016  . Near syncope 08/23/2016  . Osteoarthritis of both hips 03/08/2016  . Arthritis of knee, degenerative 02/27/2016  . Elevated sedimentation rate 02/11/2016  . Elevated C-reactive protein (CRP) 02/11/2016  . Abnormal MRI, lumbar spine (02/03/2016) 02/10/2016  . Lumbar spondylosis 02/10/2016  . Abnormal NCS (nerve conduction studies) 02/10/2016  . Chronic anticoagulation (Coumadin and Plaquenil) (due to pulmonary embolism) 02/09/2016  . Chronic low back pain (Location of Tertiary  source of pain) (Right) 02/09/2016  . Chronic pain 12/10/2015  . Neuropathy ([place]) (lower extremity peripheral neuropathy diagnosed by EMG and PNCV done by Dr. Melrose Nakayama) 12/10/2015  . Chronic lower extremity pain (Location of Primary Source of Pain) (Bilateral) (R>L) 12/10/2015  . Chronic hand pain (Bilateral) (neuropathy) 12/10/2015  . Chronic knee pain (Location of Secondary source of pain) (Bilateral) (R>L) 12/10/2015  . Inflammatory pain 12/10/2015  . Neuropathic pain 12/10/2015  . Neurogenic pain 12/10/2015  . Chronic lumbar radicular pain (S1 Dermatome) (Right) 12/10/2015  . Long term current use of opiate analgesic 12/10/2015  . Long term prescription opiate use 12/10/2015  . Opiate use 12/10/2015  . Bleeding gastrointestinal 12/10/2015  . HLD (hyperlipidemia) 12/10/2015  . History of pulmonary embolism x 2 12/10/2015  . Chronic lumbar radiculopathy (Right) 12/10/2015  . Diffuse myofascial pain syndrome 12/10/2015  . Encounter for therapeutic drug level monitoring 12/10/2015  . Encounter for chronic pain management 12/10/2015  . Encounter for pain management planning 12/10/2015  . Mixed simple and mucopurulent chronic bronchitis ([place]) 03/27/2015  . Loss of feeling or sensation 02/11/2015  . Polyneuropathy 02/11/2015  . Low serum cobalamin 11/11/2014  . Intermittent claudication ([place]) 09/25/2014  . Major depression in remission ([place]) 09/25/2014  . Lumbar canal stenosis (Severe: L4-5) (Moderate: L3-4 and L5-S1) (Mild: L1-2, L2-3) 09/25/2014  . Morbid (severe) obesity due to excess calories ([place]) 09/25/2014  . Anxiety 05/25/2014  . Chronic headache  05/25/2014  . Acid reflux 05/25/2014  . Benign hypertension 05/25/2014  . Blood glucose elevated 05/25/2014  . Obstructive apnea 05/25/2014  . Pulmonary embolism ([place]) 05/25/2014    Past Surgical History:  Procedure Laterality Date  . HERNIA REPAIR      Prior to Admission medications   Medication Sig Start Date End Date Taking?  Authorizing Provider  acetaminophen (TYLENOL) 500 MG tablet Take 1,000 mg by mouth every 6 (six) hours as needed.    [provider]  allopurinol (ZYLOPRIM) 300 MG tablet Take 300 mg by mouth daily.    [provider]  ALPRAZolam Duanne Moron) 0.25 MG tablet Take 1 tablet (0.25 mg total) by mouth 3 (three) times daily as needed for anxiety. 08/24/16   Vaughan Basta, MD  benazepril (LOTENSIN) 10 MG tablet Take 10 mg by mouth daily. Reported on 03/08/2016    [provider]  carvedilol (COREG) 6.25 MG tablet Take 1 tablet (6.25 mg total) by mouth 2 (two) times daily with a meal. 08/24/16   Vaughan Basta, MD  chlorhexidine (HIBICLENS) 4 % external liquid Apply topically daily as needed. Dilute 10-15 mL in water, Use daily when bathing for 1-2 weeks 01/02/17   Melynda Ripple, MD  Multiple Vitamin (MULTIVITAMIN) tablet Take 1 tablet by mouth daily.    [provider]  mupirocin ointment (BACTROBAN) 2 % Apply 1 application topically 3 (three) times daily. 01/02/17   Melynda Ripple, MD  pantoprazole (PROTONIX) 40 MG tablet Take 40 mg by mouth daily.    [provider]  potassium chloride (K-DUR) 10 MEQ tablet Take 10 mEq by mouth daily.    [provider]  torsemide (DEMADEX) 10 MG tablet Take 10 mg by mouth daily.    [provider]  vitamin B-12 (CYANOCOBALAMIN) 500 MCG tablet 1,000 mcg daily.  01/30/15   [provider]  warfarin (COUMADIN) 6 MG tablet Take 6 mg by mouth at bedtime.     [provider]    Allergies Duloxetine; Pseudoephedrine hcl; and Shellfish allergy  Family History  Problem Relation Age of Onset  . Diabetes Mother   . Cancer Father     Social History Social History  Substance Use Topics  . Smoking status: Light Tobacco Smoker    Packs/day: 0.00    Years: 54.00  . Smokeless tobacco: Never Used  . Alcohol use 0.0 oz/week     Comment: rare    Review of Systems  Constitutional:  No fever/chills Eyes: No visual changes. ENT: No sore throat. Cardiovascular: Denies chest pain. Respiratory: Denies shortness of breath. Gastrointestinal: No abdominal pain.  No nausea, no vomiting.  No diarrhea.  No constipation. Genitourinary: Negative for dysuria. Musculoskeletal: Negative for back pain. Skin: Negative for rash. Neurological: Negative for headaches, focal weakness or numbness.   ____________________________________________   PHYSICAL EXAM:  VITAL SIGNS: ED Triage Vitals  Enc Vitals Group     BP 09/09/17 2144 128/67     Pulse Rate 09/09/17 2144 70     Resp 09/09/17 2144 (!) 24     Temp 09/09/17 2144 99 F (37.2 C)     Temp Source 09/09/17 2144 Oral     SpO2 09/09/17 2144 97 %     Weight 09/09/17 2145 290 lb (131.5 kg)     Height 09/09/17 2145 6' (1.829 m)     Head Circumference --      Peak Flow --      Pain Score 09/09/17 2144 7  Pain Loc --      Pain Edu? --      Excl. in [place]? --     Constitutional: Alert and oriented. Well appearing and in no acute distress. Eyes: Conjunctivae are normal.  Head: Atraumatic. Nose: No congestion/rhinnorhea. Mouth/Throat: Mucous membranes are moist.  Neck: No stridor.   Cardiovascular: Normal rate, regular rhythm. Grossly normal heart sounds.  Good peripheral circulation with equal and bilateral dorsalis pedis pulses. Respiratory: Normal respiratory effort.  No retractions. Lungs CTAB. Gastrointestinal: Soft and nontender. No distention. Musculoskeletal: No lower extremity tenderness nor edema.  No joint effusions.no tenderness or swelling to the posterior right knee. No pain on active range of motion. No erythema. Neurologic:  Normal speech and language. No gross focal neurologic deficits are appreciated. Skin:  Skin is warm, dry and intact. No rash noted. Psychiatric: Mood and affect are normal. Speech and behavior are normal.  ____________________________________________   LABS (all labs ordered are  listed, but only abnormal results are displayed)  Labs Reviewed  PROTIME-INR - Abnormal; Notable for the following:       Result Value   Prothrombin Time 20.7 (*)    All other components within normal limits   ____________________________________________  EKG   ____________________________________________  RADIOLOGY  no DVT ____________________________________________   PROCEDURES  Procedure(s) performed:   Procedures  Critical Care performed:   ____________________________________________   INITIAL IMPRESSION / ASSESSMENT AND PLAN / ED COURSE  Pertinent labs & imaging results that were available during my care of the patient were reviewed by me and considered in my medical decision making (see chart for details).  unclear cause of the patient's knee pain.   DDX: DVT, muscle strain, tendinitis  Patient pain-free at this time. No distress. Denied any chest pain or shortness of breath. Respiratory rate of 18 on my examination. Patient will be discharged home. INR was low and he does admit that he has been having difficulty being therapeutic on Coumadin and he has discussed with his primary care doctor possible transition to a different anticoagulant. We discussed the need for follow-up with his primary care physician to resolve this. He is understanding when to comply.      ____________________________________________   FINAL CLINICAL IMPRESSION(S) / ED DIAGNOSES  right leg pain.    NEW MEDICATIONS STARTED DURING THIS VISIT:  New Prescriptions   No medications on file     Note:  This document was prepared using Dragon voice recognition software and may include unintentional dictation errors.     Orbie Pyo, MD 09/10/17 [phone]

## 2017-09-10 NOTE — ED Notes (Signed)
Pt. Verbalizes understanding of d/c instructions and follow-up. VS stable and pain controlled per pt.  Pt. In NAD at time of d/c and denies further concerns regarding this visit. Pt. Stable at the time of departure from the unit, departing unit by the safest and most appropriate manner per that pt condition and limitations. Pt advised to return to the ED at any time for emergent concerns, or for new/worsening symptoms.   

## 2017-09-21 DIAGNOSIS — Z7901 Long term (current) use of anticoagulants: Secondary | ICD-10-CM | POA: Diagnosis not present

## 2017-09-22 DIAGNOSIS — M5136 Other intervertebral disc degeneration, lumbar region: Secondary | ICD-10-CM | POA: Diagnosis not present

## 2017-09-22 DIAGNOSIS — M48062 Spinal stenosis, lumbar region with neurogenic claudication: Secondary | ICD-10-CM | POA: Diagnosis not present

## 2017-09-22 DIAGNOSIS — M5416 Radiculopathy, lumbar region: Secondary | ICD-10-CM | POA: Diagnosis not present

## 2017-10-04 DIAGNOSIS — H524 Presbyopia: Secondary | ICD-10-CM | POA: Diagnosis not present

## 2017-10-04 DIAGNOSIS — H25813 Combined forms of age-related cataract, bilateral: Secondary | ICD-10-CM | POA: Diagnosis not present

## 2017-10-17 DIAGNOSIS — I10 Essential (primary) hypertension: Secondary | ICD-10-CM | POA: Diagnosis not present

## 2017-10-17 DIAGNOSIS — I70219 Atherosclerosis of native arteries of extremities with intermittent claudication, unspecified extremity: Secondary | ICD-10-CM | POA: Diagnosis not present

## 2017-10-17 DIAGNOSIS — F3341 Major depressive disorder, recurrent, in partial remission: Secondary | ICD-10-CM | POA: Diagnosis not present

## 2017-10-17 DIAGNOSIS — G894 Chronic pain syndrome: Secondary | ICD-10-CM | POA: Diagnosis not present

## 2017-10-17 DIAGNOSIS — Z23 Encounter for immunization: Secondary | ICD-10-CM | POA: Diagnosis not present

## 2017-10-17 DIAGNOSIS — Z0000 Encounter for general adult medical examination without abnormal findings: Secondary | ICD-10-CM | POA: Diagnosis not present

## 2017-10-31 DIAGNOSIS — Z7901 Long term (current) use of anticoagulants: Secondary | ICD-10-CM | POA: Diagnosis not present

## 2017-12-05 DIAGNOSIS — Z7901 Long term (current) use of anticoagulants: Secondary | ICD-10-CM | POA: Diagnosis not present

## 2017-12-06 DIAGNOSIS — M5416 Radiculopathy, lumbar region: Secondary | ICD-10-CM | POA: Diagnosis not present

## 2017-12-06 DIAGNOSIS — M5136 Other intervertebral disc degeneration, lumbar region: Secondary | ICD-10-CM | POA: Diagnosis not present

## 2017-12-06 DIAGNOSIS — Z7901 Long term (current) use of anticoagulants: Secondary | ICD-10-CM | POA: Diagnosis not present

## 2017-12-06 DIAGNOSIS — M48062 Spinal stenosis, lumbar region with neurogenic claudication: Secondary | ICD-10-CM | POA: Diagnosis not present

## 2017-12-14 DIAGNOSIS — M791 Myalgia, unspecified site: Secondary | ICD-10-CM | POA: Diagnosis not present

## 2017-12-26 ENCOUNTER — Other Ambulatory Visit: Payer: Self-pay

## 2017-12-26 ENCOUNTER — Other Ambulatory Visit: Payer: Self-pay | Admitting: Physical Medicine and Rehabilitation

## 2017-12-26 DIAGNOSIS — M62830 Muscle spasm of back: Secondary | ICD-10-CM | POA: Diagnosis not present

## 2017-12-26 DIAGNOSIS — M5416 Radiculopathy, lumbar region: Secondary | ICD-10-CM

## 2017-12-26 DIAGNOSIS — M5136 Other intervertebral disc degeneration, lumbar region: Secondary | ICD-10-CM | POA: Diagnosis not present

## 2017-12-26 DIAGNOSIS — M48062 Spinal stenosis, lumbar region with neurogenic claudication: Secondary | ICD-10-CM | POA: Diagnosis not present

## 2018-01-03 ENCOUNTER — Ambulatory Visit: Payer: Medicare HMO

## 2018-01-09 DIAGNOSIS — M48062 Spinal stenosis, lumbar region with neurogenic claudication: Secondary | ICD-10-CM | POA: Diagnosis not present

## 2018-01-09 DIAGNOSIS — M62830 Muscle spasm of back: Secondary | ICD-10-CM | POA: Diagnosis not present

## 2018-01-09 DIAGNOSIS — M5136 Other intervertebral disc degeneration, lumbar region: Secondary | ICD-10-CM | POA: Diagnosis not present

## 2018-01-09 DIAGNOSIS — M5416 Radiculopathy, lumbar region: Secondary | ICD-10-CM | POA: Diagnosis not present

## 2018-01-17 DIAGNOSIS — Z7901 Long term (current) use of anticoagulants: Secondary | ICD-10-CM | POA: Diagnosis not present

## 2018-01-25 DIAGNOSIS — Z7901 Long term (current) use of anticoagulants: Secondary | ICD-10-CM | POA: Diagnosis not present

## 2018-01-26 DIAGNOSIS — M1611 Unilateral primary osteoarthritis, right hip: Secondary | ICD-10-CM | POA: Diagnosis not present

## 2018-01-31 DIAGNOSIS — J309 Allergic rhinitis, unspecified: Secondary | ICD-10-CM | POA: Diagnosis not present

## 2018-02-09 DIAGNOSIS — Z7901 Long term (current) use of anticoagulants: Secondary | ICD-10-CM | POA: Diagnosis not present

## 2018-02-20 DIAGNOSIS — J309 Allergic rhinitis, unspecified: Secondary | ICD-10-CM | POA: Diagnosis not present

## 2018-02-20 DIAGNOSIS — M1A00X0 Idiopathic chronic gout, unspecified site, without tophus (tophi): Secondary | ICD-10-CM | POA: Diagnosis not present

## 2018-02-27 DIAGNOSIS — Z7901 Long term (current) use of anticoagulants: Secondary | ICD-10-CM | POA: Diagnosis not present

## 2018-03-15 DIAGNOSIS — D1801 Hemangioma of skin and subcutaneous tissue: Secondary | ICD-10-CM | POA: Diagnosis not present

## 2018-03-15 DIAGNOSIS — L918 Other hypertrophic disorders of the skin: Secondary | ICD-10-CM | POA: Diagnosis not present

## 2018-03-15 DIAGNOSIS — Z85828 Personal history of other malignant neoplasm of skin: Secondary | ICD-10-CM | POA: Diagnosis not present

## 2018-03-15 DIAGNOSIS — D485 Neoplasm of uncertain behavior of skin: Secondary | ICD-10-CM | POA: Diagnosis not present

## 2018-03-15 DIAGNOSIS — L739 Follicular disorder, unspecified: Secondary | ICD-10-CM | POA: Diagnosis not present

## 2018-03-15 DIAGNOSIS — D2230 Melanocytic nevi of unspecified part of face: Secondary | ICD-10-CM | POA: Diagnosis not present

## 2018-03-15 DIAGNOSIS — L578 Other skin changes due to chronic exposure to nonionizing radiation: Secondary | ICD-10-CM | POA: Diagnosis not present

## 2018-03-15 DIAGNOSIS — D225 Melanocytic nevi of trunk: Secondary | ICD-10-CM | POA: Diagnosis not present

## 2018-03-15 DIAGNOSIS — L821 Other seborrheic keratosis: Secondary | ICD-10-CM | POA: Diagnosis not present

## 2018-03-21 DIAGNOSIS — Z7901 Long term (current) use of anticoagulants: Secondary | ICD-10-CM | POA: Diagnosis not present

## 2018-03-27 DIAGNOSIS — M48062 Spinal stenosis, lumbar region with neurogenic claudication: Secondary | ICD-10-CM | POA: Diagnosis not present

## 2018-03-27 DIAGNOSIS — M5136 Other intervertebral disc degeneration, lumbar region: Secondary | ICD-10-CM | POA: Diagnosis not present

## 2018-03-27 DIAGNOSIS — M5416 Radiculopathy, lumbar region: Secondary | ICD-10-CM | POA: Diagnosis not present

## 2018-03-27 DIAGNOSIS — M62830 Muscle spasm of back: Secondary | ICD-10-CM | POA: Diagnosis not present

## 2018-04-04 DIAGNOSIS — Z7901 Long term (current) use of anticoagulants: Secondary | ICD-10-CM | POA: Diagnosis not present

## 2018-04-05 DIAGNOSIS — M48062 Spinal stenosis, lumbar region with neurogenic claudication: Secondary | ICD-10-CM | POA: Diagnosis not present

## 2018-04-05 DIAGNOSIS — M5136 Other intervertebral disc degeneration, lumbar region: Secondary | ICD-10-CM | POA: Diagnosis not present

## 2018-04-05 DIAGNOSIS — M5416 Radiculopathy, lumbar region: Secondary | ICD-10-CM | POA: Diagnosis not present

## 2018-05-09 DIAGNOSIS — Z125 Encounter for screening for malignant neoplasm of prostate: Secondary | ICD-10-CM | POA: Diagnosis not present

## 2018-05-09 DIAGNOSIS — G629 Polyneuropathy, unspecified: Secondary | ICD-10-CM | POA: Diagnosis not present

## 2018-05-09 DIAGNOSIS — J449 Chronic obstructive pulmonary disease, unspecified: Secondary | ICD-10-CM | POA: Diagnosis not present

## 2018-05-09 DIAGNOSIS — K219 Gastro-esophageal reflux disease without esophagitis: Secondary | ICD-10-CM | POA: Diagnosis not present

## 2018-05-09 DIAGNOSIS — E7800 Pure hypercholesterolemia, unspecified: Secondary | ICD-10-CM | POA: Diagnosis not present

## 2018-05-09 DIAGNOSIS — M1A00X0 Idiopathic chronic gout, unspecified site, without tophus (tophi): Secondary | ICD-10-CM | POA: Diagnosis not present

## 2018-05-09 DIAGNOSIS — I10 Essential (primary) hypertension: Secondary | ICD-10-CM | POA: Diagnosis not present

## 2018-05-09 DIAGNOSIS — R7302 Impaired glucose tolerance (oral): Secondary | ICD-10-CM | POA: Diagnosis not present

## 2018-05-09 DIAGNOSIS — F3341 Major depressive disorder, recurrent, in partial remission: Secondary | ICD-10-CM | POA: Diagnosis not present

## 2018-05-09 DIAGNOSIS — Z7901 Long term (current) use of anticoagulants: Secondary | ICD-10-CM | POA: Diagnosis not present

## 2018-05-18 DIAGNOSIS — Z7901 Long term (current) use of anticoagulants: Secondary | ICD-10-CM | POA: Diagnosis not present

## 2018-05-22 ENCOUNTER — Other Ambulatory Visit: Payer: Self-pay

## 2018-05-22 ENCOUNTER — Emergency Department
Admission: EM | Admit: 2018-05-22 | Discharge: 2018-05-22 | Disposition: A | Discharge status: Home / Self Care | Payer: Medicare HMO | Attending: Emergency Medicine | Admitting: Emergency Medicine

## 2018-05-22 ENCOUNTER — Encounter: Payer: Self-pay | Admitting: Emergency Medicine

## 2018-05-22 DIAGNOSIS — T364X5A Adverse effect of tetracyclines, initial encounter: Secondary | ICD-10-CM | POA: Diagnosis not present

## 2018-05-22 DIAGNOSIS — Z79899 Other long term (current) drug therapy: Secondary | ICD-10-CM | POA: Insufficient documentation

## 2018-05-22 DIAGNOSIS — Z7901 Long term (current) use of anticoagulants: Secondary | ICD-10-CM | POA: Diagnosis not present

## 2018-05-22 DIAGNOSIS — Z85828 Personal history of other malignant neoplasm of skin: Secondary | ICD-10-CM | POA: Insufficient documentation

## 2018-05-22 DIAGNOSIS — Z720 Tobacco use: Secondary | ICD-10-CM | POA: Diagnosis not present

## 2018-05-22 DIAGNOSIS — R21 Rash and other nonspecific skin eruption: Secondary | ICD-10-CM | POA: Insufficient documentation

## 2018-05-22 DIAGNOSIS — T7840XA Allergy, unspecified, initial encounter: Secondary | ICD-10-CM | POA: Diagnosis not present

## 2018-05-22 LAB — CBC
HCT: 38.6 % — ABNORMAL LOW (ref 40.0–52.0)
HEMOGLOBIN: 13.2 g/dL (ref 13.0–18.0)
MCH: 30.5 pg (ref 26.0–34.0)
MCHC: 34.2 g/dL (ref 32.0–36.0)
MCV: 89 fL (ref 80.0–100.0)
Platelets: 264 10*3/uL (ref 150–440)
RBC: 4.34 MIL/uL — AB (ref 4.40–5.90)
RDW: 15.5 % — ABNORMAL HIGH (ref 11.5–14.5)
WBC: 8.1 10*3/uL (ref 3.8–10.6)

## 2018-05-22 LAB — COMPREHENSIVE METABOLIC PANEL
ALK PHOS: 88 U/L (ref 38–126)
ALT: 20 U/L (ref 17–63)
AST: 28 U/L (ref 15–41)
Albumin: 4.1 g/dL (ref 3.5–5.0)
Anion gap: 9 (ref 5–15)
BUN: 9 mg/dL (ref 6–20)
CALCIUM: 8.8 mg/dL — AB (ref 8.9–10.3)
CO2: 27 mmol/L (ref 22–32)
CREATININE: 1 mg/dL (ref 0.61–1.24)
Chloride: 102 mmol/L (ref 101–111)
Glucose, Bld: 107 mg/dL — ABNORMAL HIGH (ref 65–99)
Potassium: 3.8 mmol/L (ref 3.5–5.1)
Sodium: 138 mmol/L (ref 135–145)
TOTAL PROTEIN: 7.5 g/dL (ref 6.5–8.1)
Total Bilirubin: 0.5 mg/dL (ref 0.3–1.2)

## 2018-05-22 LAB — URINALYSIS, COMPLETE (UACMP) WITH MICROSCOPIC
Bacteria, UA: NONE SEEN
Bilirubin Urine: NEGATIVE
GLUCOSE, UA: NEGATIVE mg/dL
Hgb urine dipstick: NEGATIVE
Ketones, ur: NEGATIVE mg/dL
Leukocytes, UA: NEGATIVE
NITRITE: NEGATIVE
PH: 5 (ref 5.0–8.0)
Protein, ur: NEGATIVE mg/dL
SPECIFIC GRAVITY, URINE: 1.006 (ref 1.005–1.030)
Squamous Epithelial / LPF: NONE SEEN (ref 0–5)

## 2018-05-22 LAB — PROTIME-INR
INR: 2.74
PROTHROMBIN TIME: 28.8 s — AB (ref 11.4–15.2)

## 2018-05-22 MED ORDER — METHYLPREDNISOLONE ACETATE 40 MG/ML IJ SUSP
40.0000 mg | Freq: Once | INTRAMUSCULAR | Status: AC
  Administered 2018-05-22: 40 mg via INTRAMUSCULAR
  Filled 2018-05-22: qty 1

## 2018-05-22 NOTE — ED Provider Notes (Signed)
[facility] Emergency Department Provider Note   ____________________________________________   First MD Initiated Contact with Patient 05/22/18 1033     (approximate)  I have reviewed the triage vital signs and the nursing notes.   HISTORY  Chief Complaint Rash and Groin Pain    HPI Adrian [name] is a 72 y.o. male [place] he thought he had a urinary tract infection yesterday so he took some doxycycline and then he developed an itchy rash in his groin all the way up towards his rectum and he also developed an itchy rash on his left wrist. Left wrist had a blister on it that popped but is still itchy and red. This morning his wife put some anti-itch cream on his groin because it was itching so badly. It is not itching any more in the areas where his wife put the NPH cream on are clear. When he went to retract his foreskin to clean himself today he tore some skin on the shaft of the penis this looks clean and dry reduced most of the tear there is just a little line in the skin where the skin separated on the shaft of the penis. The itching is resolved except for the area on his wrist when he did not put the anti-itch cream   Past Medical History:  Diagnosis Date  . Anxiety   . Arthritis    rheumatoid  . Basal cell carcinoma of skin    skin  . Hypertension   . Lumbar radiculopathy 09/25/2014  . Pulmonary embolism ([place]) 05/25/2014   Overview:  Times 2 on anticoagulation a.  Times two.   b.  Filter placed.    Last Assessment & Plan:  Times 2 on anticoagulation a.  Times two.   b.  Filter placed. No current bleeding noted.    . Reflux     Patient Active Problem List   Diagnosis Date Noted  . Chest pain 08/23/2016  . Near syncope 08/23/2016  . Osteoarthritis of both hips 03/08/2016  . Arthritis of knee, degenerative 02/27/2016  . Elevated sedimentation rate 02/11/2016  . Elevated C-reactive protein (CRP) 02/11/2016  . Abnormal MRI, lumbar spine (02/03/2016)  02/10/2016  . Lumbar spondylosis 02/10/2016  . Abnormal NCS (nerve conduction studies) 02/10/2016  . Chronic anticoagulation (Coumadin and Plaquenil) (due to pulmonary embolism) 02/09/2016  . Chronic low back pain (Location of Tertiary source of pain) (Right) 02/09/2016  . Chronic pain 12/10/2015  . Neuropathy ([place]) (lower extremity peripheral neuropathy diagnosed by EMG and PNCV done by Dr. Melrose Nakayama) 12/10/2015  . Chronic lower extremity pain (Location of Primary Source of Pain) (Bilateral) (R>L) 12/10/2015  . Chronic hand pain (Bilateral) (neuropathy) 12/10/2015  . Chronic knee pain (Location of Secondary source of pain) (Bilateral) (R>L) 12/10/2015  . Inflammatory pain 12/10/2015  . Neuropathic pain 12/10/2015  . Neurogenic pain 12/10/2015  . Chronic lumbar radicular pain (S1 Dermatome) (Right) 12/10/2015  . Long term current use of opiate analgesic 12/10/2015  . Long term prescription opiate use 12/10/2015  . Opiate use 12/10/2015  . Bleeding gastrointestinal 12/10/2015  . HLD (hyperlipidemia) 12/10/2015  . History of pulmonary embolism x 2 12/10/2015  . Chronic lumbar radiculopathy (Right) 12/10/2015  . Diffuse myofascial pain syndrome 12/10/2015  . Encounter for therapeutic drug level monitoring 12/10/2015  . Encounter for chronic pain management 12/10/2015  . Encounter for pain management planning 12/10/2015  . Mixed simple and mucopurulent chronic bronchitis ([place]) 03/27/2015  . Loss of feeling or sensation 02/11/2015  .  Polyneuropathy 02/11/2015  . Low serum cobalamin 11/11/2014  . Intermittent claudication ([place]) 09/25/2014  . Major depression in remission ([place]) 09/25/2014  . Lumbar canal stenosis (Severe: L4-5) (Moderate: L3-4 and L5-S1) (Mild: L1-2, L2-3) 09/25/2014  . Morbid (severe) obesity due to excess calories ([place]) 09/25/2014  . Anxiety 05/25/2014  . Chronic headache 05/25/2014  . Acid reflux 05/25/2014  . Benign hypertension 05/25/2014  . Blood glucose elevated  05/25/2014  . Obstructive apnea 05/25/2014  . Pulmonary embolism ([place]) 05/25/2014    Past Surgical History:  Procedure Laterality Date  . HERNIA REPAIR      Prior to Admission medications   Medication Sig Start Date End Date Taking? Authorizing Provider  acetaminophen (TYLENOL) 500 MG tablet Take 1,000 mg by mouth every 6 (six) hours as needed.    [provider]  allopurinol (ZYLOPRIM) 300 MG tablet Take 300 mg by mouth daily.    [provider]  ALPRAZolam Duanne Moron) 0.25 MG tablet Take 1 tablet (0.25 mg total) by mouth 3 (three) times daily as needed for anxiety. 08/24/16   Vaughan Basta, MD  benazepril (LOTENSIN) 10 MG tablet Take 10 mg by mouth daily. Reported on 03/08/2016    [provider]  carvedilol (COREG) 6.25 MG tablet Take 1 tablet (6.25 mg total) by mouth 2 (two) times daily with a meal. 08/24/16   Vaughan Basta, MD  chlorhexidine (HIBICLENS) 4 % external liquid Apply topically daily as needed. Dilute 10-15 mL in water, Use daily when bathing for 1-2 weeks 01/02/17   Melynda Ripple, MD  Multiple Vitamin (MULTIVITAMIN) tablet Take 1 tablet by mouth daily.    [provider]  mupirocin ointment (BACTROBAN) 2 % Apply 1 application topically 3 (three) times daily. 01/02/17   Melynda Ripple, MD  pantoprazole (PROTONIX) 40 MG tablet Take 40 mg by mouth daily.    [provider]  potassium chloride (K-DUR) 10 MEQ tablet Take 10 mEq by mouth daily.    [provider]  torsemide (DEMADEX) 10 MG tablet Take 10 mg by mouth daily.    [provider]  vitamin B-12 (CYANOCOBALAMIN) 500 MCG tablet 1,000 mcg daily.  01/30/15   [provider]  warfarin (COUMADIN) 6 MG tablet Take 6 mg by mouth at bedtime.     [provider]    Allergies Duloxetine; Pseudoephedrine hcl; and Shellfish allergy  Family History  Problem Relation Age of Onset  . Diabetes Mother   . Cancer Father     Social  History Social History   Tobacco Use  . Smoking status: Light Tobacco Smoker    Packs/day: 0.00    Years: 54.00    Pack years: 0.00  . Smokeless tobacco: Never Used  Substance Use Topics  . Alcohol use: Yes    Alcohol/week: 0.0 oz    Comment: rare  . Drug use: No    Review of Systems  Constitutional: No fever/chills Eyes: No visual changes. ENT: No sore throat. Cardiovascular: Denies chest pain. Respiratory: Denies shortness of breath. Gastrointestinal: No abdominal pain.  No nausea, no vomiting.  No diarrhea.  No constipation. Genitourinary: Negative for dysuria. Musculoskeletal: Negative for back pain. Skin: Negative for rash. Neurological: Negative for headaches, focal weakness   ____________________________________________   PHYSICAL EXAM:  VITAL SIGNS: ED Triage Vitals  Enc Vitals Group     BP 05/22/18 0827 (!) 163/89     Pulse Rate 05/22/18 0827 79     Resp 05/22/18 0827 20     Temp 05/22/18  0827 98.3 F (36.8 C)     Temp Source 05/22/18 0827 Oral     SpO2 05/22/18 0827 97 %     Weight 05/22/18 0829 290 lb (131.5 kg)     Height 05/22/18 0829 6' (1.829 m)     Head Circumference --      Peak Flow --      Pain Score 05/22/18 0837 0     Pain Loc --      Pain Edu? --      Excl. in [place]? --     Constitutional: Alert and oriented. Well appearing and in no acute distress. Eyes: Conjunctivae are normal.  Head: Atraumatic. Nose: No congestion/rhinnorhea. Mouth/Throat: Mucous membranes are moist.  Oropharynx non-erythematous. Neck: No stridor.   Cardiovascular: Normal rate, regular rhythm. Grossly normal heart sounds.  Good peripheral circulation. Respiratory: Normal respiratory effort.  No retractions. Lungs CTAB. Gastrointestinal: Soft and nontender. No distention. No abdominal bruits. No CVA tenderness. Genitourinary: patient was skin tear on the shaft of uncircumcised penis is described as only about a quarters of a centimeter long. There is no surrounding  erythema. Skin in the groin is clear it is not itching any more than is no rash there. Musculoskeletal: No lower extremity tenderness nor edema.  No joint effusions. Neurologic:  Normal speech and language. No gross focal neurologic deficits are appreciated. No gait instability. Skin:  Skin is warm, dry and intact. No rash noted except for one area on the wrist where there is also a 2 mm skin tear over what the patient says was the middle of the blister. There is some petechiae and erythema in the area which is about 1 cm x 1.5 cm in size. Patient reports this area is very itchy.. Psychiatric: Mood and affect are normal. Speech and behavior are normal.  ____________________________________________   LABS (all labs ordered are listed, but only abnormal results are displayed)  Labs Reviewed  URINALYSIS, COMPLETE (UACMP) WITH MICROSCOPIC - Abnormal; Notable for the following components:      Result Value   Color, Urine YELLOW (*)    APPearance CLEAR (*)    All other components within normal limits  CBC - Abnormal; Notable for the following components:   RBC 4.34 (*)    HCT 38.6 (*)    RDW 15.5 (*)    All other components within normal limits  COMPREHENSIVE METABOLIC PANEL - Abnormal; Notable for the following components:   Glucose, Bld 107 (*)    Calcium 8.8 (*)    All other components within normal limits  PROTIME-INR - Abnormal; Notable for the following components:   Prothrombin Time 28.8 (*)    All other components within normal limits   ____________________________________________  EKG   ____________________________________________  RADIOLOGY  ED MD interpretation:    Official radiology report(s): No results found.  ____________________________________________   PROCEDURES  Procedure(s) performed:   Procedures  Critical Care performed:   ____________________________________________   INITIAL IMPRESSION / ASSESSMENT AND PLAN / ED COURSE  patient looks as  though he is treated the problem himself with the Benadryl and anti-itch cream that he took yesterday and today. We'll have him apply some of the anti-itch cream or Benadryl lotion to his wrist as well give him a shot of dexamethasone IM which she asked for and have him take Benadryl today. I will have him return if he is worse. He wears his watch on the wrist but the TM line for that is below the area where  the erythema is a do not believe that the erythema is from his buckle on his band of his watch.         ____________________________________________   FINAL CLINICAL IMPRESSION(S) / ED DIAGNOSES  Final diagnoses:  Rash  Allergic reaction, initial encounter     ED Discharge Orders    None       Note:  This document was prepared using Dragon voice recognition software and may include unintentional dictation errors.    Nena Polio, MD 05/22/18 1113

## 2018-05-22 NOTE — Discharge Instructions (Addendum)
keep the skin tear on your penis clean and dry. If he gets sweaty change her underwear change her underwear therefore times a day until it heals. Return for any increased redness or swelling there. Continue with the anti-itch cream 2-3 times a day and take 2 over-the-counter Benadryl's when you get home and 2 more times after that today so that you take it approximately every 6 hours. This will make you quite sleepy today so don't drive or go anywhere. tomorrow you should be able to resume your regular activities. Also put some of the Benadryl cream or Benadryl spray on your wrist where the rash is. please return for worsening of rash or itching or any other symptoms.

## 2018-05-22 NOTE — ED Notes (Signed)
First Nurse Note:  Rash left wrist and groin area.  States he took Doxycycline that his Mother in law had left over on Sat. For a possible UTI, rash started on Sat.

## 2018-05-22 NOTE — ED Notes (Signed)
Pt in NAD at time of departure, VSS , ambulatory. PT denies any further concerns. Unable to sign due to topaz malfnx

## 2018-05-22 NOTE — ED Triage Notes (Signed)
States had R groin pain x 2 days. Took family members doxycycline. After took antibiotic began rash bilateral groin and wrists.

## 2018-05-24 DIAGNOSIS — M48062 Spinal stenosis, lumbar region with neurogenic claudication: Secondary | ICD-10-CM | POA: Diagnosis not present

## 2018-05-24 DIAGNOSIS — M62830 Muscle spasm of back: Secondary | ICD-10-CM | POA: Diagnosis not present

## 2018-05-24 DIAGNOSIS — M5416 Radiculopathy, lumbar region: Secondary | ICD-10-CM | POA: Diagnosis not present

## 2018-06-09 DIAGNOSIS — J309 Allergic rhinitis, unspecified: Secondary | ICD-10-CM | POA: Diagnosis not present

## 2018-06-09 DIAGNOSIS — Z7901 Long term (current) use of anticoagulants: Secondary | ICD-10-CM | POA: Diagnosis not present

## 2018-06-26 DIAGNOSIS — M62830 Muscle spasm of back: Secondary | ICD-10-CM | POA: Diagnosis not present

## 2018-06-26 DIAGNOSIS — M5136 Other intervertebral disc degeneration, lumbar region: Secondary | ICD-10-CM | POA: Diagnosis not present

## 2018-06-26 DIAGNOSIS — M5416 Radiculopathy, lumbar region: Secondary | ICD-10-CM | POA: Diagnosis not present

## 2018-06-26 DIAGNOSIS — M48062 Spinal stenosis, lumbar region with neurogenic claudication: Secondary | ICD-10-CM | POA: Diagnosis not present

## 2018-06-28 DIAGNOSIS — Z7901 Long term (current) use of anticoagulants: Secondary | ICD-10-CM | POA: Diagnosis not present

## 2018-06-29 DIAGNOSIS — M1611 Unilateral primary osteoarthritis, right hip: Secondary | ICD-10-CM | POA: Diagnosis not present

## 2018-07-06 DIAGNOSIS — R300 Dysuria: Secondary | ICD-10-CM | POA: Diagnosis not present

## 2018-07-06 DIAGNOSIS — N410 Acute prostatitis: Secondary | ICD-10-CM | POA: Diagnosis not present

## 2018-07-24 DIAGNOSIS — Z7901 Long term (current) use of anticoagulants: Secondary | ICD-10-CM | POA: Diagnosis not present

## 2018-07-26 DIAGNOSIS — Z7901 Long term (current) use of anticoagulants: Secondary | ICD-10-CM | POA: Diagnosis not present

## 2018-07-27 DIAGNOSIS — M5136 Other intervertebral disc degeneration, lumbar region: Secondary | ICD-10-CM | POA: Diagnosis not present

## 2018-07-27 DIAGNOSIS — M5416 Radiculopathy, lumbar region: Secondary | ICD-10-CM | POA: Diagnosis not present

## 2018-07-27 DIAGNOSIS — M48062 Spinal stenosis, lumbar region with neurogenic claudication: Secondary | ICD-10-CM | POA: Diagnosis not present

## 2018-07-31 DIAGNOSIS — N411 Chronic prostatitis: Secondary | ICD-10-CM | POA: Diagnosis not present

## 2018-08-25 DIAGNOSIS — M5416 Radiculopathy, lumbar region: Secondary | ICD-10-CM | POA: Diagnosis not present

## 2018-08-25 DIAGNOSIS — M48062 Spinal stenosis, lumbar region with neurogenic claudication: Secondary | ICD-10-CM | POA: Diagnosis not present

## 2018-08-25 DIAGNOSIS — M1611 Unilateral primary osteoarthritis, right hip: Secondary | ICD-10-CM | POA: Diagnosis not present

## 2018-08-25 DIAGNOSIS — M5136 Other intervertebral disc degeneration, lumbar region: Secondary | ICD-10-CM | POA: Diagnosis not present

## 2018-08-25 DIAGNOSIS — Z7901 Long term (current) use of anticoagulants: Secondary | ICD-10-CM | POA: Diagnosis not present

## 2018-08-25 DIAGNOSIS — M62830 Muscle spasm of back: Secondary | ICD-10-CM | POA: Diagnosis not present

## 2018-08-31 ENCOUNTER — Ambulatory Visit: Payer: Medicare HMO

## 2018-08-31 ENCOUNTER — Encounter: Payer: Self-pay | Admitting: Podiatry

## 2018-08-31 ENCOUNTER — Ambulatory Visit: Payer: Medicare HMO | Admitting: Podiatry

## 2018-08-31 DIAGNOSIS — L918 Other hypertrophic disorders of the skin: Secondary | ICD-10-CM | POA: Diagnosis not present

## 2018-08-31 DIAGNOSIS — M129 Arthropathy, unspecified: Secondary | ICD-10-CM | POA: Diagnosis not present

## 2018-08-31 DIAGNOSIS — M79671 Pain in right foot: Secondary | ICD-10-CM

## 2018-08-31 DIAGNOSIS — L578 Other skin changes due to chronic exposure to nonionizing radiation: Secondary | ICD-10-CM | POA: Diagnosis not present

## 2018-08-31 DIAGNOSIS — L821 Other seborrheic keratosis: Secondary | ICD-10-CM | POA: Diagnosis not present

## 2018-08-31 DIAGNOSIS — M2041 Other hammer toe(s) (acquired), right foot: Secondary | ICD-10-CM

## 2018-08-31 DIAGNOSIS — L820 Inflamed seborrheic keratosis: Secondary | ICD-10-CM | POA: Diagnosis not present

## 2018-08-31 NOTE — Progress Notes (Signed)
This patient presents the office for an evaluation of his right forefoot.  He says that approximately 6-7 months ago. He caught his right foot in a   sliding door at home.  He says the nail. He had injured the toe and foot but never sought professional treatment.  He says ihe thought it would get better on its own but now experiences burning pain and swelling at the base of his second and third toes right foot.  He finally decided to present to the office today for an evaluation and treatment of this, swollen, site.  Patient is taking coumadin.  Vascular  Dorsalis pedis and posterior tibial pulses are palpable  B/L.  Capillary return  WNL.  Temperature gradient is  WNL.  Skin turgor  WNL  Sensorium  Senn Weinstein monofilament wire  WNL. Normal tactile sensation.  Nail Exam  Patient has normal nails with no evidence of bacterial or fungal infection.  Orthopedic  Exam  Muscle tone and muscle strength  WNL.  No limitations of motion feet  B/L. Hammer toe deformities 2, 3, of the right foot.  Mild medial deviation of the second and third digits right foot.  Patient has a mild HAV deformity first MPJ right.  Swelling noted in the second interspace over the right forefoot.  No palpable pain no the second and third metatarsals right foot.  No limited range of second, third MPJ right foot.   Skin  No open lesions.  Normal skin texture and turgor.  Traumatic arthritis 2nd  MPJ right  Hammer toes 2,3 due to trauma and probable flexor plate injury.  IE.  X-rays were taken to reveal a significant medial deviation at the second and third MPJ.  Status post fracture at the head of the second metatarsal right foot.  Discussed this condition with this patient.  Patient is presently taking Celebrex which she was told to discontinue.  He was told to take one Mobic daily.  Patient states that this is not severe and he can live with this condition.  We discussed this could worsen over time and he is to present to the  office as needed. If pain occurs.   Gardiner Barefoot DPM

## 2018-09-06 ENCOUNTER — Emergency Department: Payer: Medicare HMO

## 2018-09-06 ENCOUNTER — Other Ambulatory Visit: Payer: Self-pay

## 2018-09-06 ENCOUNTER — Emergency Department
Admission: EM | Admit: 2018-09-06 | Discharge: 2018-09-06 | Disposition: A | Discharge status: Home / Self Care | Payer: Medicare HMO | Attending: Emergency Medicine | Admitting: Emergency Medicine

## 2018-09-06 ENCOUNTER — Encounter: Payer: Self-pay | Admitting: Emergency Medicine

## 2018-09-06 DIAGNOSIS — F17200 Nicotine dependence, unspecified, uncomplicated: Secondary | ICD-10-CM | POA: Insufficient documentation

## 2018-09-06 DIAGNOSIS — Z79899 Other long term (current) drug therapy: Secondary | ICD-10-CM | POA: Diagnosis not present

## 2018-09-06 DIAGNOSIS — M1611 Unilateral primary osteoarthritis, right hip: Secondary | ICD-10-CM | POA: Insufficient documentation

## 2018-09-06 DIAGNOSIS — I10 Essential (primary) hypertension: Secondary | ICD-10-CM | POA: Diagnosis not present

## 2018-09-06 DIAGNOSIS — M25551 Pain in right hip: Secondary | ICD-10-CM

## 2018-09-06 DIAGNOSIS — Z86711 Personal history of pulmonary embolism: Secondary | ICD-10-CM | POA: Insufficient documentation

## 2018-09-06 DIAGNOSIS — Z7901 Long term (current) use of anticoagulants: Secondary | ICD-10-CM | POA: Insufficient documentation

## 2018-09-06 MED ORDER — HYDROCODONE-ACETAMINOPHEN 10-325 MG PO TABS
1.0000 | ORAL_TABLET | Freq: Once | ORAL | Status: AC
  Administered 2018-09-06: 1 via ORAL
  Filled 2018-09-06: qty 1

## 2018-09-06 MED ORDER — HYDROCODONE-ACETAMINOPHEN 10-325 MG PO TABS: 1.0000 | ORAL_TABLET | Freq: Four times a day (QID) | ORAL | 0 refills | Status: DC | PRN

## 2018-09-06 NOTE — Discharge Instructions (Addendum)
Follow-up with your primary care provider if any continued problems or Dr. Sharlet Salina may need to see you earlier for an injection to your hip.  Also consider making an appointment with Dr. Mack Guise who is the orthopedist on call.

## 2018-09-06 NOTE — ED Provider Notes (Signed)
[facility] Emergency Department Provider Note   ____________________________________________   First MD Initiated Contact with Patient 09/06/18 1232     (approximate)  I have reviewed the triage vital signs and the nursing notes.   HISTORY  Chief Complaint Hip Pain  HPI Adrian [name] is a 72 y.o. male presents to the ED with complaint of right hip pain.  Patient states he has had chronic problems with his hip and is had cortisone injections done by Dr. Margette Fast and also in his lower back.  States that he has an appointment in October for the same.  Today he complains of right hip pain and states that he heard a pop when he was walking this morning which is caused him concern.  There was no fall or trauma.  Patient states he is also out of pain medication.  He rates his pain as 10/10.  Past Medical History:  Diagnosis Date  . Anxiety   . Arthritis    rheumatoid  . Basal cell carcinoma of skin    skin  . Hypertension   . Lumbar radiculopathy 09/25/2014  . Pulmonary embolism ([place]) 05/25/2014   Overview:  Times 2 on anticoagulation a.  Times two.   b.  Filter placed.    Last Assessment & Plan:  Times 2 on anticoagulation a.  Times two.   b.  Filter placed. No current bleeding noted.    . Reflux     Patient Active Problem List   Diagnosis Date Noted  . Chest pain 08/23/2016  . Near syncope 08/23/2016  . Osteoarthritis of both hips 03/08/2016  . Arthritis of knee, degenerative 02/27/2016  . Elevated sedimentation rate 02/11/2016  . Elevated C-reactive protein (CRP) 02/11/2016  . Abnormal MRI, lumbar spine (02/03/2016) 02/10/2016  . Lumbar spondylosis 02/10/2016  . Abnormal NCS (nerve conduction studies) 02/10/2016  . Chronic anticoagulation (Coumadin and Plaquenil) (due to pulmonary embolism) 02/09/2016  . Chronic low back pain (Location of Tertiary source of pain) (Right) 02/09/2016  . Chronic pain 12/10/2015  . Neuropathy ([place]) (lower extremity  peripheral neuropathy diagnosed by EMG and PNCV done by Dr. Melrose Nakayama) 12/10/2015  . Chronic lower extremity pain (Location of Primary Source of Pain) (Bilateral) (R>L) 12/10/2015  . Chronic hand pain (Bilateral) (neuropathy) 12/10/2015  . Chronic knee pain (Location of Secondary source of pain) (Bilateral) (R>L) 12/10/2015  . Inflammatory pain 12/10/2015  . Neuropathic pain 12/10/2015  . Neurogenic pain 12/10/2015  . Chronic lumbar radicular pain (S1 Dermatome) (Right) 12/10/2015  . Long term current use of opiate analgesic 12/10/2015  . Long term prescription opiate use 12/10/2015  . Opiate use 12/10/2015  . Bleeding gastrointestinal 12/10/2015  . HLD (hyperlipidemia) 12/10/2015  . History of pulmonary embolism x 2 12/10/2015  . Chronic lumbar radiculopathy (Right) 12/10/2015  . Diffuse myofascial pain syndrome 12/10/2015  . Encounter for therapeutic drug level monitoring 12/10/2015  . Encounter for chronic pain management 12/10/2015  . Encounter for pain management planning 12/10/2015  . Mixed simple and mucopurulent chronic bronchitis ([place]) 03/27/2015  . Loss of feeling or sensation 02/11/2015  . Polyneuropathy 02/11/2015  . Low serum cobalamin 11/11/2014  . Intermittent claudication ([place]) 09/25/2014  . Major depression in remission ([place]) 09/25/2014  . Lumbar canal stenosis (Severe: L4-5) (Moderate: L3-4 and L5-S1) (Mild: L1-2, L2-3) 09/25/2014  . Morbid (severe) obesity due to excess calories ([place]) 09/25/2014  . Morbid obesity with BMI of 40.0-44.9, adult ([place]) 09/25/2014  . Anxiety 05/25/2014  . Chronic headache 05/25/2014  .  Acid reflux 05/25/2014  . Benign hypertension 05/25/2014  . Blood glucose elevated 05/25/2014  . Obstructive apnea 05/25/2014  . Pulmonary embolism ([place]) 05/25/2014    Past Surgical History:  Procedure Laterality Date  . HERNIA REPAIR      Prior to Admission medications   Medication Sig Start Date End Date Taking? Authorizing Provider    acetaminophen (TYLENOL) 500 MG tablet Take 1,000 mg by mouth every 6 (six) hours as needed.    [provider]  allopurinol (ZYLOPRIM) 300 MG tablet Take 300 mg by mouth daily.    [provider]  ALPRAZolam Duanne Moron) 0.25 MG tablet Take 1 tablet (0.25 mg total) by mouth 3 (three) times daily as needed for anxiety. 08/24/16   Vaughan Basta, MD  amLODipine (NORVASC) 2.5 MG tablet  07/05/18   [provider]  benazepril (LOTENSIN) 10 MG tablet Take 10 mg by mouth daily. Reported on 03/08/2016    [provider]  carvedilol (COREG) 6.25 MG tablet Take 1 tablet (6.25 mg total) by mouth 2 (two) times daily with a meal. 08/24/16   Vaughan Basta, MD  celecoxib (CELEBREX) 200 MG capsule Take 200 mg by mouth daily. 07/28/18   [provider]  chlorhexidine (HIBICLENS) 4 % external liquid Apply topically daily as needed. Dilute 10-15 mL in water, Use daily when bathing for 1-2 weeks 01/02/17   Melynda Ripple, MD  gabapentin (NEURONTIN) 300 MG capsule Take by mouth.    [provider]  HYDROcodone-acetaminophen (NORCO) 10-325 MG tablet Take 1 tablet by mouth every 6 (six) hours as needed. 09/06/18   Johnn Hai, PA-C  Multiple Vitamin (MULTIVITAMIN) tablet Take 1 tablet by mouth daily.    [provider]  pantoprazole (PROTONIX) 40 MG tablet Take 40 mg by mouth daily.    [provider]  potassium chloride (K-DUR) 10 MEQ tablet Take 10 mEq by mouth daily.    [provider]  predniSONE (STERAPRED UNI-PAK 21 TAB) 10 MG (21) TBPK tablet TAKE 6 TABLETS ON DAY 1 AS DIRECTED ON PACKAGE AND DECREASE BY 1 TAB EACH DAY FOR A TOTAL OF 6 DAYS 06/09/18   [provider]  torsemide (DEMADEX) 10 MG tablet Take 10 mg by mouth daily.    [provider]  venlafaxine XR (EFFEXOR-XR) 75 MG 24 hr capsule  08/16/18   [provider]  vitamin B-12 (CYANOCOBALAMIN) 500 MCG tablet 1,000 mcg daily.  01/30/15    [provider]  warfarin (COUMADIN) 6 MG tablet Take 6 mg by mouth at bedtime.     [provider]    Allergies Duloxetine; Pseudoephedrine hcl; and Shellfish allergy  Family History  Problem Relation Age of Onset  . Diabetes Mother   . Cancer Father     Social History Social History   Tobacco Use  . Smoking status: Light Tobacco Smoker    Packs/day: 0.00    Years: 54.00    Pack years: 0.00  . Smokeless tobacco: Never Used  Substance Use Topics  . Alcohol use: Yes    Alcohol/week: 0.0 standard drinks    Comment: rare  . Drug use: No    Review of Systems Constitutional: No fever/chills Cardiovascular: Denies chest pain. Respiratory: Denies shortness of breath. Musculoskeletal: Positive for chronic low back pain and right hip pain. Skin: Negative for rash. Neurological: Negative for headaches, focal weakness or numbness. ___________________________________________   PHYSICAL EXAM:  VITAL SIGNS: ED Triage Vitals [09/06/18 1108]  Enc Vitals Group  BP 127/66     Pulse Rate 66     Resp 18     Temp 98.3 F (36.8 C)     Temp Source Oral     SpO2 97 %     Weight 290 lb (131.5 kg)     Height 6' (1.829 m)     Head Circumference      Peak Flow      Pain Score 10     Pain Loc      Pain Edu?      Excl. in [place]?    Constitutional: Alert and oriented. Well appearing and in no acute distress sitting in a wheelchair. Eyes: Conjunctivae are normal.  Head: Atraumatic. Neck: No stridor.   Cardiovascular: Normal rate, regular rhythm. Grossly normal heart sounds.  Good peripheral circulation. Respiratory: Normal respiratory effort.  No retractions. Lungs CTAB. Musculoskeletal: Examination of the right hip there is no gross deformity and range of motion is mildly guarded secondary to discomfort.  Patient is able to extend his leg and move without assistance.  There is no shortening or rotation noted on exam. Neurologic:  Normal speech and language. No  gross focal neurologic deficits are appreciated.  Skin:  Skin is warm, dry and intact.  Psychiatric: Mood and affect are normal. Speech and behavior are normal.  ____________________________________________   LABS (all labs ordered are listed, but only abnormal results are displayed)  Labs Reviewed - No data to display  RADIOLOGY  ED MD interpretation:   Right hip with severe degenerative changes.  No fracture seen.  Official radiology report(s): Dg Hip Unilat  With Pelvis 2-3 Views Right  Result Date: 09/06/2018 CLINICAL DATA:  Chronic hip pain, no known injury, initial encounter EXAM: DG HIP (WITH OR WITHOUT PELVIS) 2-3V RIGHT COMPARISON:  06/16/2016 FINDINGS: Progressive degenerative changes are noted in the right hip joint with remodeling of the superior aspect of the acetabulum as well as subchondral sclerosis and cyst formation. No acute fracture or dislocation is noted. Degenerative changes of lumbar spine and left hip joint are noted as well. IMPRESSION: Progressive degenerative changes. No definitive acute abnormality is seen. Electronically Signed   By: Inez Catalina M.D.   On: 09/06/2018 12:06  ____________________________________________   PROCEDURES  Procedure(s) performed: None  Procedures  Critical Care performed: No  ____________________________________________   INITIAL IMPRESSION / ASSESSMENT AND PLAN / ED COURSE  As part of my medical decision making, I reviewed the following data within the electronic MEDICAL RECORD NUMBER Notes from prior ED visits and [place] Controlled Substance Database  Patient presents to the emergency department with complaint of acute exacerbation of his chronic right hip pain.  There was no history of an injury.  He states he felt a pop has had pain since that time.  Patient does have an appointment for cortisone injections to his back and hip for chronic pain.  X-rays are consistent with severe degenerative arthritis to the right hip.  Patient  was made aware and given a copy of his x-ray.  He is encouraged to consider seeing an orthopedist and the information for the orthopedist on call today was given to him.  Patient was given a limited amount of hydrocodone 10 mg/325.  He is instructed to contact his PCP or his pain doctor if any continued medication as needed.  ____________________________________________   FINAL CLINICAL IMPRESSION(S) / ED DIAGNOSES  Final diagnoses:  Acute pain of right hip  Osteoarthritis of right hip, unspecified osteoarthritis type  ED Discharge Orders         Ordered    HYDROcodone-acetaminophen (NORCO) 10-325 MG tablet  Every 6 hours PRN     09/06/18 1258           Note:  This document was prepared using Dragon voice recognition software and may include unintentional dictation errors.    Johnn Hai, PA-C 09/06/18 1521    Eula Listen, MD 09/07/18 2226

## 2018-09-06 NOTE — ED Triage Notes (Signed)
PT has chronic problems/pain in his right hip but states and hour prior to arrival pt heard a pop and has had pain there since. No shortening, rotation, or deformity.

## 2018-09-21 DIAGNOSIS — Z7901 Long term (current) use of anticoagulants: Secondary | ICD-10-CM | POA: Diagnosis not present

## 2018-09-22 DIAGNOSIS — Z7901 Long term (current) use of anticoagulants: Secondary | ICD-10-CM | POA: Diagnosis not present

## 2018-09-22 DIAGNOSIS — M1611 Unilateral primary osteoarthritis, right hip: Secondary | ICD-10-CM | POA: Diagnosis not present

## 2018-10-30 DIAGNOSIS — Z23 Encounter for immunization: Secondary | ICD-10-CM | POA: Diagnosis not present

## 2018-11-07 DIAGNOSIS — M542 Cervicalgia: Secondary | ICD-10-CM | POA: Diagnosis not present

## 2018-11-07 DIAGNOSIS — M5442 Lumbago with sciatica, left side: Secondary | ICD-10-CM | POA: Diagnosis not present

## 2018-11-07 DIAGNOSIS — M9903 Segmental and somatic dysfunction of lumbar region: Secondary | ICD-10-CM | POA: Diagnosis not present

## 2018-11-09 ENCOUNTER — Ambulatory Visit: Payer: Self-pay | Admitting: Urology

## 2018-11-14 ENCOUNTER — Encounter: Payer: Self-pay | Admitting: Urology

## 2018-11-14 ENCOUNTER — Ambulatory Visit: Payer: Medicare HMO | Admitting: Urology

## 2018-11-14 ENCOUNTER — Other Ambulatory Visit: Payer: Self-pay

## 2018-11-14 VITALS — BP 151/76 | HR 89 | Ht 72.0 in | Wt 295.0 lb

## 2018-11-14 DIAGNOSIS — N411 Chronic prostatitis: Secondary | ICD-10-CM | POA: Diagnosis not present

## 2018-11-14 DIAGNOSIS — M9903 Segmental and somatic dysfunction of lumbar region: Secondary | ICD-10-CM | POA: Diagnosis not present

## 2018-11-14 DIAGNOSIS — M542 Cervicalgia: Secondary | ICD-10-CM | POA: Diagnosis not present

## 2018-11-14 DIAGNOSIS — M5442 Lumbago with sciatica, left side: Secondary | ICD-10-CM | POA: Diagnosis not present

## 2018-11-14 LAB — URINALYSIS, COMPLETE
BILIRUBIN UA: NEGATIVE
Glucose, UA: NEGATIVE
Ketones, UA: NEGATIVE
Leukocytes, UA: NEGATIVE
Nitrite, UA: NEGATIVE
PH UA: 6 (ref 5.0–7.5)
Protein, UA: NEGATIVE
RBC UA: NEGATIVE
Specific Gravity, UA: <1.005 — ABNORMAL LOW (ref 1.005–1.030)
UUROB: 0.2 mg/dL (ref 0.2–1.0)

## 2018-11-14 LAB — MICROSCOPIC EXAMINATION
BACTERIA UA: NONE SEEN
EPITHELIAL CELLS (NON RENAL): NONE SEEN /HPF (ref 0–10)
RBC, UA: NONE SEEN /hpf (ref 0–2)
WBC UA: NONE SEEN /HPF (ref 0–5)

## 2018-11-14 LAB — BLADDER SCAN AMB NON-IMAGING: SCAN RESULT: 47

## 2018-11-14 MED ORDER — OXYBUTYNIN CHLORIDE ER 10 MG PO TB24: 10.0000 mg | ORAL_TABLET | Freq: Every day | ORAL | 11 refills | Status: DC

## 2018-11-14 NOTE — Progress Notes (Signed)
11/14/2018 9:28 AM   Brain Hilts 01-Dec-1946 [id number]  Referring provider: Sofie Hartigan, MD [address] [place], [place] [address]  CC: "Chronic prostatitis"  HPI: I saw Mr. Frieden in urology clinic today in consultation for "chronic prostatitis" from Dr. Ellison Hughs.  He is a 72 year old comorbid male with history of PE on anticoagulation with Coumadin, sleep apnea, chronic pain, anxiety, and morbid obesity.  His primary complaints are urinary urgency and frequency with occasional urge incontinence.  He also has nocturia 3-4 times per night.  He feels his stream usually is strong, but occasionally he feels he has difficulty emptying.  PVR in clinic today is 45 cc.  He does consume 5-6 Cokes a day.  He is a smoker for over 50 years.  IPSS score today is 23, with quality of life terrible.  He was originally treated by his primary care with a month worth of Cipro, he feels like this did not improve his symptoms at all.  There are no prior positive urine cultures in the chart.  PSA is normal at 2.18 in May 2019.  He denies any perineal or scrotal pain.  Severity is moderate.  Duration is more than 3 months.   PMH: Past Medical History:  Diagnosis Date  . Anxiety   . Arthritis    rheumatoid  . Basal cell carcinoma of skin    skin  . Hypertension   . Lumbar radiculopathy 09/25/2014  . Pulmonary embolism ([place]) 05/25/2014   Overview:  Times 2 on anticoagulation a.  Times two.   b.  Filter placed.    Last Assessment & Plan:  Times 2 on anticoagulation a.  Times two.   b.  Filter placed. No current bleeding noted.    . Reflux     Surgical History: Past Surgical History:  Procedure Laterality Date  . HERNIA REPAIR     Allergies:  Allergies  Allergen Reactions  . Duloxetine     Other reaction(s): Hallucination  . Pseudoephedrine Hcl     Other reaction(s): Other (See Comments) Other Reaction: ELEVATED BLOOD PRESSURE  . Shellfish Allergy Swelling    Uncoded Allergy. Allergen:  seafood, Other Reaction: "THROAT CLOSING" Uncoded Allergy. Allergen: seafood, Other Reaction: "THROAT CLOSING"    Family History: Family History  Problem Relation Age of Onset  . Diabetes Mother   . Cancer Father     Social History:  reports that he has been smoking. He has been smoking about 0.00 packs per day for the past 54.00 years. He has never used smokeless tobacco. He reports that he drinks alcohol. He reports that he does not use drugs.  ROS: Please see flowsheet from today's date for complete review of systems.  Physical Exam: BP (!) 151/76   Pulse 89   Ht 6' (1.829 m)   Wt 295 lb (133.8 kg)   BMI 40.01 kg/m    Constitutional:  Alert and oriented, No acute distress.  Obese Cardiovascular: No clubbing, cyanosis, or edema. Respiratory: Normal respiratory effort, no increased work of breathing. GI: Abdomen is soft, nontender, nondistended, no abdominal masses GU: No CVA tenderness, uncircumcised phallus without lesions, widely patent meatus DRE: Deferred Lymph: No cervical or inguinal lymphadenopathy. Skin: No rashes, bruises or suspicious lesions. Neurologic: Grossly intact, no focal deficits, moving all 4 extremities. Psychiatric: Normal mood and affect.  Laboratory Data: Reviewed  Urinalysis today 0 WBCs, 0 RBCs, no bacteria, nitrite negative  Pertinent Imaging: None to review  Assessment & Plan:   In  summary, Mr. Kates is a 72 year old obese male with numerous comorbidities and urinary symptoms most consistent with overactive bladder.  We discussed that overactive bladder (OAB) is not a disease, but is a symptom complex that is generally not life-threatening.  Symptoms typically include urinary urgency, frequency, and urge incontinence.  There are numerous treatment options, however there are risks and benefits with both medical and surgical management.  First-line treatment is behavioral therapies including bladder training, pelvic floor muscle training, and  fluid management.  Second line treatments include oral antimuscarinics(Ditropan er, Trospium) and beta-3 agonist (Mybetriq). There is typically a period of medication trial (4-8 weeks) to find the optimal therapy and dosing. If symptoms are bothersome despite the above management, third line options include intra-detrusor botox, peripheral tibial nerve stimulation (PTNS), and interstim (SNS). These are more invasive treatments with higher side effect profile, but may improve quality of life for patients with severe OAB symptoms.   Minimize sodas, minimize fluids after 7pm and double voiding prior to bed Trial of ditropan XL daily, discussed possible side effects  RTC 6 weeks for symptom check   Billey Co, MD  [place] [address], [place], [place] [address] [phone]

## 2018-11-18 ENCOUNTER — Encounter: Payer: Self-pay | Admitting: Emergency Medicine

## 2018-11-18 ENCOUNTER — Emergency Department: Payer: Medicare HMO

## 2018-11-18 ENCOUNTER — Emergency Department
Admission: EM | Admit: 2018-11-18 | Discharge: 2018-11-18 | Disposition: A | Discharge status: Home / Self Care | Payer: Medicare HMO | Attending: Emergency Medicine | Admitting: Emergency Medicine

## 2018-11-18 ENCOUNTER — Other Ambulatory Visit: Payer: Self-pay

## 2018-11-18 DIAGNOSIS — Z7901 Long term (current) use of anticoagulants: Secondary | ICD-10-CM | POA: Diagnosis not present

## 2018-11-18 DIAGNOSIS — M79604 Pain in right leg: Secondary | ICD-10-CM | POA: Diagnosis not present

## 2018-11-18 DIAGNOSIS — F17210 Nicotine dependence, cigarettes, uncomplicated: Secondary | ICD-10-CM | POA: Diagnosis not present

## 2018-11-18 DIAGNOSIS — M62838 Other muscle spasm: Secondary | ICD-10-CM | POA: Diagnosis not present

## 2018-11-18 DIAGNOSIS — Z79899 Other long term (current) drug therapy: Secondary | ICD-10-CM | POA: Insufficient documentation

## 2018-11-18 DIAGNOSIS — M79605 Pain in left leg: Secondary | ICD-10-CM | POA: Insufficient documentation

## 2018-11-18 DIAGNOSIS — M79606 Pain in leg, unspecified: Secondary | ICD-10-CM

## 2018-11-18 DIAGNOSIS — M79661 Pain in right lower leg: Secondary | ICD-10-CM | POA: Diagnosis not present

## 2018-11-18 DIAGNOSIS — I10 Essential (primary) hypertension: Secondary | ICD-10-CM | POA: Diagnosis not present

## 2018-11-18 DIAGNOSIS — M79662 Pain in left lower leg: Secondary | ICD-10-CM | POA: Diagnosis not present

## 2018-11-18 LAB — PROTIME-INR
INR: 2.31
Prothrombin Time: 25.1 seconds — ABNORMAL HIGH (ref 11.4–15.2)

## 2018-11-18 MED ORDER — CYCLOBENZAPRINE HCL 10 MG PO TABS: 10.0000 mg | ORAL_TABLET | Freq: Three times a day (TID) | ORAL | 0 refills | Status: DC | PRN

## 2018-11-18 MED ORDER — IBUPROFEN 800 MG PO TABS: 800.0000 mg | ORAL_TABLET | Freq: Three times a day (TID) | ORAL | 0 refills | Status: DC | PRN

## 2018-11-18 MED ORDER — IBUPROFEN 800 MG PO TABS
800.0000 mg | ORAL_TABLET | Freq: Once | ORAL | Status: AC
  Administered 2018-11-18: 800 mg via ORAL
  Filled 2018-11-18: qty 1

## 2018-11-18 NOTE — ED Provider Notes (Signed)
[facility] Emergency Department Provider Note ____________________________________________   I have reviewed the triage vital signs and the triage nursing note.  HISTORY  Chief Complaint Leg Pain   Historian Patient  HPI Adrian [name] is a 72 y.o. male with a history of sciatica, history of PE for which he is on warfarin, as well as hypertension, presents with thigh/leg pain bilaterally for about a week.    No known injury or specific overuse.  States this feels different from his typical sciatica.  He does receive injections for radiculopathy.  States that his legs hurt more when he stands up, not particularly exertional.  This morning he felt like he wanted to be checked to make sure he did not have blood clots.  No skin rash.  No fevers.  No chest pain.  No weakness or numbness.     Past Medical History:  Diagnosis Date  . Anxiety   . Arthritis    rheumatoid  . Basal cell carcinoma of skin    skin  . Hypertension   . Lumbar radiculopathy 09/25/2014  . Pulmonary embolism ([place]) 05/25/2014   Overview:  Times 2 on anticoagulation a.  Times two.   b.  Filter placed.    Last Assessment & Plan:  Times 2 on anticoagulation a.  Times two.   b.  Filter placed. No current bleeding noted.    . Reflux     Patient Active Problem List   Diagnosis Date Noted  . Chest pain 08/23/2016  . Near syncope 08/23/2016  . Osteoarthritis of both hips 03/08/2016  . Arthritis of knee, degenerative 02/27/2016  . Elevated sedimentation rate 02/11/2016  . Elevated C-reactive protein (CRP) 02/11/2016  . Abnormal MRI, lumbar spine (02/03/2016) 02/10/2016  . Lumbar spondylosis 02/10/2016  . Abnormal NCS (nerve conduction studies) 02/10/2016  . Chronic anticoagulation (Coumadin and Plaquenil) (due to pulmonary embolism) 02/09/2016  . Chronic low back pain (Location of Tertiary source of pain) (Right) 02/09/2016  . Chronic pain 12/10/2015  . Neuropathy ([place]) (lower  extremity peripheral neuropathy diagnosed by EMG and PNCV done by Dr. Melrose Nakayama) 12/10/2015  . Chronic lower extremity pain (Location of Primary Source of Pain) (Bilateral) (R>L) 12/10/2015  . Chronic hand pain (Bilateral) (neuropathy) 12/10/2015  . Chronic knee pain (Location of Secondary source of pain) (Bilateral) (R>L) 12/10/2015  . Inflammatory pain 12/10/2015  . Neuropathic pain 12/10/2015  . Neurogenic pain 12/10/2015  . Chronic lumbar radicular pain (S1 Dermatome) (Right) 12/10/2015  . Long term current use of opiate analgesic 12/10/2015  . Long term prescription opiate use 12/10/2015  . Opiate use 12/10/2015  . Bleeding gastrointestinal 12/10/2015  . HLD (hyperlipidemia) 12/10/2015  . History of pulmonary embolism x 2 12/10/2015  . Chronic lumbar radiculopathy (Right) 12/10/2015  . Diffuse myofascial pain syndrome 12/10/2015  . Encounter for therapeutic drug level monitoring 12/10/2015  . Encounter for chronic pain management 12/10/2015  . Encounter for pain management planning 12/10/2015  . Mixed simple and mucopurulent chronic bronchitis ([place]) 03/27/2015  . Loss of feeling or sensation 02/11/2015  . Polyneuropathy 02/11/2015  . Low serum cobalamin 11/11/2014  . Intermittent claudication ([place]) 09/25/2014  . Major depression in remission ([place]) 09/25/2014  . Lumbar canal stenosis (Severe: L4-5) (Moderate: L3-4 and L5-S1) (Mild: L1-2, L2-3) 09/25/2014  . Morbid (severe) obesity due to excess calories ([place]) 09/25/2014  . Morbid obesity with BMI of 40.0-44.9, adult ([place]) 09/25/2014  . Anxiety 05/25/2014  . Chronic headache 05/25/2014  . Acid reflux 05/25/2014  .  Benign hypertension 05/25/2014  . Blood glucose elevated 05/25/2014  . Obstructive apnea 05/25/2014  . Pulmonary embolism ([place]) 05/25/2014    Past Surgical History:  Procedure Laterality Date  . HERNIA REPAIR      Prior to Admission medications   Medication Sig Start Date End Date Taking? Authorizing Provider   acetaminophen (TYLENOL) 500 MG tablet Take 1,000 mg by mouth every 6 (six) hours as needed.    [provider]  allopurinol (ZYLOPRIM) 300 MG tablet Take 300 mg by mouth daily.    [provider]  ALPRAZolam Duanne Moron) 0.25 MG tablet Take 1 tablet (0.25 mg total) by mouth 3 (three) times daily as needed for anxiety. 08/24/16   Vaughan Basta, MD  amLODipine (NORVASC) 2.5 MG tablet  07/05/18   [provider]  benazepril (LOTENSIN) 10 MG tablet Take 10 mg by mouth daily. Reported on 03/08/2016    [provider]  carvedilol (COREG) 6.25 MG tablet Take 1 tablet (6.25 mg total) by mouth 2 (two) times daily with a meal. 08/24/16   Vaughan Basta, MD  celecoxib (CELEBREX) 200 MG capsule Take 200 mg by mouth daily. 07/28/18   [provider]  chlorhexidine (HIBICLENS) 4 % external liquid Apply topically daily as needed. Dilute 10-15 mL in water, Use daily when bathing for 1-2 weeks 01/02/17   Melynda Ripple, MD  cyclobenzaprine (FLEXERIL) 10 MG tablet Take 1 tablet (10 mg total) by mouth 3 (three) times daily as needed for muscle spasms. 11/18/18   Lisa Roca, MD  gabapentin (NEURONTIN) 300 MG capsule Take by mouth.    [provider]  HYDROcodone-acetaminophen (NORCO) 10-325 MG tablet Take 1 tablet by mouth every 6 (six) hours as needed. 09/06/18   Johnn Hai, PA-C  ibuprofen (ADVIL,MOTRIN) 800 MG tablet Take 1 tablet (800 mg total) by mouth every 8 (eight) hours as needed. 11/18/18   Lisa Roca, MD  Multiple Vitamin (MULTIVITAMIN) tablet Take 1 tablet by mouth daily.    [provider]  oxybutynin (DITROPAN-XL) 10 MG 24 hr tablet Take 1 tablet (10 mg total) by mouth daily. 11/14/18   Billey Co, MD  pantoprazole (PROTONIX) 40 MG tablet Take 40 mg by mouth daily.    [provider]  potassium chloride (K-DUR) 10 MEQ tablet Take 10 mEq by mouth daily.    [provider]  torsemide (DEMADEX) 10 MG  tablet Take 10 mg by mouth daily.    [provider]  venlafaxine XR (EFFEXOR-XR) 75 MG 24 hr capsule  08/16/18   [provider]  vitamin B-12 (CYANOCOBALAMIN) 500 MCG tablet 1,000 mcg daily.  01/30/15   [provider]  warfarin (COUMADIN) 6 MG tablet Take 6 mg by mouth at bedtime.     [provider]    Allergies  Allergen Reactions  . Duloxetine     Other reaction(s): Hallucination  . Pseudoephedrine Hcl     Other reaction(s): Other (See Comments) Other Reaction: ELEVATED BLOOD PRESSURE  . Shellfish Allergy Swelling    Uncoded Allergy. Allergen: seafood, Other Reaction: "THROAT CLOSING" Uncoded Allergy. Allergen: seafood, Other Reaction: "THROAT CLOSING"    Family History  Problem Relation Age of Onset  . Diabetes Mother   . Cancer Father     Social History Social History   Tobacco Use  . Smoking status: Light Tobacco Smoker    Packs/day: 0.10    Years: 54.00    Pack years: 5.40    Types: Cigarettes  . Smokeless tobacco:  Never Used  Substance Use Topics  . Alcohol use: Yes    Alcohol/week: 0.0 standard drinks    Comment: rare  . Drug use: No    Review of Systems  Constitutional: Negative for fever. Eyes: Negative for visual changes. ENT: Negative for sore throat. Cardiovascular: Negative for chest pain. Respiratory: Negative for shortness of breath. Gastrointestinal: Negative for abdominal pain, vomiting and diarrhea. Genitourinary: Negative for dysuria. Musculoskeletal: Negative for back pain.  Bilateral thigh pain as per HPI Skin: Negative for rash. Neurological: Negative for headache.  ____________________________________________   PHYSICAL EXAM:  VITAL SIGNS: ED Triage Vitals  Enc Vitals Group     BP 11/18/18 0810 136/83     Pulse Rate 11/18/18 0810 80     Resp 11/18/18 0810 20     Temp 11/18/18 0810 98 F (36.7 C)     Temp Source 11/18/18 0810 Oral     SpO2 11/18/18 0810 96 %     Weight 11/18/18 0811 295  lb (133.8 kg)     Height 11/18/18 0811 6' (1.829 m)     Head Circumference --      Peak Flow --      Pain Score 11/18/18 0811 8     Pain Loc --      Pain Edu? --      Excl. in [place]? --      Constitutional: Alert and oriented.  HEENT      Head: Normocephalic and atraumatic.      Eyes: Conjunctivae are normal. Pupils equal and round.       Ears:         Nose: No congestion/rhinnorhea.      Mouth/Throat: Mucous membranes are moist.      Neck: No stridor. Cardiovascular/Chest: Normal rate, regular rhythm.  No murmurs, rubs, or gallops. Respiratory: Normal respiratory effort without tachypnea nor retractions. Breath sounds are clear and equal bilaterally. No wheezes/rales/rhonchi. Gastrointestinal: Soft. No distention, no guarding, no rebound. Nontender.    Genitourinary/rectal:Deferred Musculoskeletal: Pelvis stable.  Nontender with normal range of motion in all extremities. No joint effusions.  No lower extremity edema.  Tender calfs bilaterally.  Tender and palpable muscle spasm of the right medial quad .  Sore left thigh as well.  NV intact of bilat lower extremities. Neurologic:  Normal speech and language. No gross or focal neurologic deficits are appreciated. Skin:  Skin is warm, dry and intact. No rash noted. Psychiatric: Mood and affect are normal. Speech and behavior are normal. Patient exhibits appropriate insight and judgment.   ____________________________________________  LABS (pertinent positives/negatives) I, Lisa Roca, MD the attending physician have reviewed the labs noted below.  Labs Reviewed  PROTIME-INR - Abnormal; Notable for the following components:      Result Value   Prothrombin Time 25.1 (*)    All other components within normal limits    ____________________________________________    EKG I, Lisa Roca, MD, the attending physician have personally viewed and interpreted all  ECGs.  None ____________________________________________  RADIOLOGY   LE [place] bilat:  IMPRESSION: No femoropopliteal and no calf DVT in the visualized calf veins. If clinical symptoms are inconsistent or if there are persistent or worsening symptoms, further imaging (possibly involving the iliac veins) may be warranted. __________________________________________  PROCEDURES  Procedure(s) performed: None  Procedures  Critical Care performed: None   ____________________________________________  ED COURSE / ASSESSMENT AND PLAN  Pertinent labs & imaging results that were available during my care of the patient were reviewed by  me and considered in my medical decision making (see chart for details).    No cellulitis.  No neurologic deficits.  Neurovascularly intact, no evidence for acute arterial occlusion.  Although clinically not a real high suspicion for DVTs, patient does have a history of blood clotting, will obtain lower extremity Doppler to rule out DVT.  He is on Coumadin I will check his INR.  He does have a history of radicular pain, sciatica, but states this feels different.  On exam he does have a tender muscle spasm of the right quadriceps, and I suspect muscle spasm may be the source of his discomfort.  We discussed conservative management.  I will give him a dose of ibuprofen here as well as a muscle relaxer for discharge.     CONSULTATIONS:  None  Patient / Family / Caregiver informed of clinical course, medical decision-making process, and agree with plan.   I discussed return precautions, follow-up instructions, and discharge instructions with patient and/or family.  Discharge Instructions : Although no certain cause was found, your exam and evaluation are overall reassuring in the emerge department today.  As we discussed, I am most suspicious of muscle spasm causing your pain.  Return to the emergency department immediately for any worsening condition  including numbness, weakness, cold or blue foot, skin rash, swelling, or uncontrolled pain, any incontinence of urine or stool, or any other symptoms concerning to you.    ___________________________________________   FINAL CLINICAL IMPRESSION(S) / ED DIAGNOSES   Final diagnoses:  Bilateral leg pain  Muscle spasm      ___________________________________________         Note: This dictation was prepared with Dragon dictation. Any transcriptional errors that result from this process are unintentional    Lisa Roca, MD 11/18/18 1206

## 2018-11-18 NOTE — ED Notes (Signed)
Patient transported to Ultrasound 

## 2018-11-18 NOTE — ED Triage Notes (Signed)
States both legs sore x 1 week. Denies fall or injury. Denies edema.

## 2018-11-18 NOTE — Discharge Instructions (Addendum)
Although no certain cause was found, your exam and evaluation are overall reassuring in the emerge department today.  As we discussed, I am most suspicious of muscle spasm causing your pain.  Return to the emergency department immediately for any worsening condition including numbness, weakness, cold or blue foot, skin rash, swelling, or uncontrolled pain, any incontinence of urine or stool, or any other symptoms concerning to you.

## 2018-11-18 NOTE — ED Notes (Signed)
Pt c/o bilateral leg pain for a week. "achy and sore" like after you work out per pt. Also reports "swelling" around his right groin area.

## 2018-11-20 DIAGNOSIS — Z7901 Long term (current) use of anticoagulants: Secondary | ICD-10-CM | POA: Diagnosis not present

## 2018-11-21 DIAGNOSIS — M5416 Radiculopathy, lumbar region: Secondary | ICD-10-CM | POA: Diagnosis not present

## 2018-11-21 DIAGNOSIS — Z7901 Long term (current) use of anticoagulants: Secondary | ICD-10-CM | POA: Diagnosis not present

## 2018-11-21 DIAGNOSIS — M5136 Other intervertebral disc degeneration, lumbar region: Secondary | ICD-10-CM | POA: Diagnosis not present

## 2018-11-21 DIAGNOSIS — M48062 Spinal stenosis, lumbar region with neurogenic claudication: Secondary | ICD-10-CM | POA: Diagnosis not present

## 2018-11-22 DIAGNOSIS — E7800 Pure hypercholesterolemia, unspecified: Secondary | ICD-10-CM | POA: Diagnosis not present

## 2018-11-22 DIAGNOSIS — G629 Polyneuropathy, unspecified: Secondary | ICD-10-CM | POA: Diagnosis not present

## 2018-11-22 DIAGNOSIS — F3341 Major depressive disorder, recurrent, in partial remission: Secondary | ICD-10-CM | POA: Diagnosis not present

## 2018-11-22 DIAGNOSIS — M1A00X0 Idiopathic chronic gout, unspecified site, without tophus (tophi): Secondary | ICD-10-CM | POA: Diagnosis not present

## 2018-11-22 DIAGNOSIS — J449 Chronic obstructive pulmonary disease, unspecified: Secondary | ICD-10-CM | POA: Diagnosis not present

## 2018-11-22 DIAGNOSIS — E119 Type 2 diabetes mellitus without complications: Secondary | ICD-10-CM | POA: Diagnosis not present

## 2018-11-22 DIAGNOSIS — I10 Essential (primary) hypertension: Secondary | ICD-10-CM | POA: Diagnosis not present

## 2018-11-22 DIAGNOSIS — G4733 Obstructive sleep apnea (adult) (pediatric): Secondary | ICD-10-CM | POA: Diagnosis not present

## 2018-11-22 DIAGNOSIS — K219 Gastro-esophageal reflux disease without esophagitis: Secondary | ICD-10-CM | POA: Diagnosis not present

## 2018-11-22 DIAGNOSIS — Z0000 Encounter for general adult medical examination without abnormal findings: Secondary | ICD-10-CM | POA: Diagnosis not present

## 2018-11-28 DIAGNOSIS — E7800 Pure hypercholesterolemia, unspecified: Secondary | ICD-10-CM | POA: Diagnosis not present

## 2018-11-28 DIAGNOSIS — E119 Type 2 diabetes mellitus without complications: Secondary | ICD-10-CM | POA: Diagnosis not present

## 2018-12-04 DIAGNOSIS — M5441 Lumbago with sciatica, right side: Secondary | ICD-10-CM | POA: Diagnosis not present

## 2018-12-04 DIAGNOSIS — M461 Sacroiliitis, not elsewhere classified: Secondary | ICD-10-CM | POA: Diagnosis not present

## 2018-12-04 DIAGNOSIS — M9904 Segmental and somatic dysfunction of sacral region: Secondary | ICD-10-CM | POA: Diagnosis not present

## 2018-12-04 DIAGNOSIS — M9903 Segmental and somatic dysfunction of lumbar region: Secondary | ICD-10-CM | POA: Diagnosis not present

## 2018-12-18 DIAGNOSIS — M1611 Unilateral primary osteoarthritis, right hip: Secondary | ICD-10-CM | POA: Diagnosis not present

## 2018-12-18 DIAGNOSIS — Z7901 Long term (current) use of anticoagulants: Secondary | ICD-10-CM | POA: Diagnosis not present

## 2018-12-21 ENCOUNTER — Telehealth: Payer: Self-pay | Admitting: Urology

## 2018-12-21 NOTE — Telephone Encounter (Signed)
Please advise 

## 2018-12-21 NOTE — Telephone Encounter (Signed)
Recommendations:  Minimize sodas, minimize fluids after 7pm and double voiding prior to bed Weight loss Needs to get a sleep study re: sleep apnea  Can try myrbetriq if he is interested to come get a month of free samples. Otherwise can schedule follow up to discuss further in clinic.  Nickolas Madrid, MD 12/21/2018

## 2018-12-21 NOTE — Telephone Encounter (Signed)
Patient said that the oxybutynin is not working and wants to know if there is something else he can try. He canceled his follow up app due to having to have knee surgery.   Sharyn Lull

## 2018-12-22 MED ORDER — MIRABEGRON ER 50 MG PO TB24: 50.0000 mg | ORAL_TABLET | Freq: Every day | ORAL | 0 refills | Status: DC

## 2018-12-22 NOTE — Telephone Encounter (Signed)
Samples in bag for patient to pick up.

## 2018-12-22 NOTE — Telephone Encounter (Signed)
Patient currently has CPAP machine.  He is interested in starting Myrbetriq 50, advised patient he can have samples and then will need to follow up in the office.

## 2018-12-26 ENCOUNTER — Ambulatory Visit: Payer: Self-pay | Admitting: Urology

## 2018-12-26 ENCOUNTER — Inpatient Hospital Stay: Admission: RE | Admit: 2018-12-26 | Payer: Self-pay | Source: Ambulatory Visit

## 2018-12-27 ENCOUNTER — Other Ambulatory Visit: Payer: Self-pay

## 2019-01-01 ENCOUNTER — Encounter
Admission: RE | Admit: 2019-01-01 | Discharge: 2019-01-01 | Disposition: A | Discharge status: Home / Self Care | Payer: Medicare HMO | Source: Ambulatory Visit | Attending: Orthopedic Surgery | Admitting: Orthopedic Surgery

## 2019-01-01 ENCOUNTER — Other Ambulatory Visit: Payer: Self-pay

## 2019-01-01 DIAGNOSIS — K219 Gastro-esophageal reflux disease without esophagitis: Secondary | ICD-10-CM | POA: Diagnosis present

## 2019-01-01 DIAGNOSIS — Z01818 Encounter for other preprocedural examination: Secondary | ICD-10-CM | POA: Insufficient documentation

## 2019-01-01 DIAGNOSIS — Z86010 Personal history of colonic polyps: Secondary | ICD-10-CM | POA: Diagnosis not present

## 2019-01-01 DIAGNOSIS — M1611 Unilateral primary osteoarthritis, right hip: Secondary | ICD-10-CM | POA: Diagnosis present

## 2019-01-01 DIAGNOSIS — G629 Polyneuropathy, unspecified: Secondary | ICD-10-CM | POA: Diagnosis present

## 2019-01-01 DIAGNOSIS — Z888 Allergy status to other drugs, medicaments and biological substances status: Secondary | ICD-10-CM | POA: Diagnosis not present

## 2019-01-01 DIAGNOSIS — Z79891 Long term (current) use of opiate analgesic: Secondary | ICD-10-CM | POA: Diagnosis not present

## 2019-01-01 DIAGNOSIS — Z85828 Personal history of other malignant neoplasm of skin: Secondary | ICD-10-CM | POA: Diagnosis not present

## 2019-01-01 DIAGNOSIS — Z791 Long term (current) use of non-steroidal anti-inflammatories (NSAID): Secondary | ICD-10-CM | POA: Diagnosis not present

## 2019-01-01 DIAGNOSIS — Z471 Aftercare following joint replacement surgery: Secondary | ICD-10-CM | POA: Diagnosis not present

## 2019-01-01 DIAGNOSIS — Z6841 Body Mass Index (BMI) 40.0 and over, adult: Secondary | ICD-10-CM | POA: Diagnosis not present

## 2019-01-01 DIAGNOSIS — G8929 Other chronic pain: Secondary | ICD-10-CM | POA: Diagnosis present

## 2019-01-01 DIAGNOSIS — D62 Acute posthemorrhagic anemia: Secondary | ICD-10-CM | POA: Diagnosis not present

## 2019-01-01 DIAGNOSIS — I10 Essential (primary) hypertension: Secondary | ICD-10-CM | POA: Diagnosis present

## 2019-01-01 DIAGNOSIS — Z91013 Allergy to seafood: Secondary | ICD-10-CM | POA: Diagnosis not present

## 2019-01-01 DIAGNOSIS — F418 Other specified anxiety disorders: Secondary | ICD-10-CM | POA: Diagnosis not present

## 2019-01-01 DIAGNOSIS — Z86711 Personal history of pulmonary embolism: Secondary | ICD-10-CM | POA: Diagnosis not present

## 2019-01-01 DIAGNOSIS — Z801 Family history of malignant neoplasm of trachea, bronchus and lung: Secondary | ICD-10-CM | POA: Diagnosis not present

## 2019-01-01 DIAGNOSIS — Z833 Family history of diabetes mellitus: Secondary | ICD-10-CM | POA: Diagnosis not present

## 2019-01-01 DIAGNOSIS — Z7901 Long term (current) use of anticoagulants: Secondary | ICD-10-CM | POA: Diagnosis not present

## 2019-01-01 DIAGNOSIS — Z7952 Long term (current) use of systemic steroids: Secondary | ICD-10-CM | POA: Diagnosis not present

## 2019-01-01 DIAGNOSIS — E785 Hyperlipidemia, unspecified: Secondary | ICD-10-CM | POA: Diagnosis present

## 2019-01-01 DIAGNOSIS — G4733 Obstructive sleep apnea (adult) (pediatric): Secondary | ICD-10-CM | POA: Diagnosis present

## 2019-01-01 DIAGNOSIS — Z8249 Family history of ischemic heart disease and other diseases of the circulatory system: Secondary | ICD-10-CM | POA: Diagnosis not present

## 2019-01-01 DIAGNOSIS — Z808 Family history of malignant neoplasm of other organs or systems: Secondary | ICD-10-CM | POA: Diagnosis not present

## 2019-01-01 DIAGNOSIS — Z79899 Other long term (current) drug therapy: Secondary | ICD-10-CM | POA: Diagnosis not present

## 2019-01-01 DIAGNOSIS — Z96641 Presence of right artificial hip joint: Secondary | ICD-10-CM | POA: Diagnosis not present

## 2019-01-01 HISTORY — DX: Pneumonia, unspecified organism: J18.9

## 2019-01-01 HISTORY — DX: Umbilical hernia without obstruction or gangrene: K42.9

## 2019-01-01 HISTORY — DX: Gout, unspecified: M10.9

## 2019-01-01 HISTORY — DX: Gastro-esophageal reflux disease without esophagitis: K21.9

## 2019-01-01 HISTORY — DX: Major depressive disorder, single episode, unspecified: F32.9

## 2019-01-01 HISTORY — DX: Depression, unspecified: F32.A

## 2019-01-01 HISTORY — DX: Polyneuropathy, unspecified: G62.9

## 2019-01-01 HISTORY — DX: Unspecified hemorrhoids: K64.9

## 2019-01-01 HISTORY — DX: Sleep apnea, unspecified: G47.30

## 2019-01-01 HISTORY — DX: Unspecified osteoarthritis, unspecified site: M19.90

## 2019-01-01 HISTORY — DX: Polyp of colon: K63.5

## 2019-01-01 LAB — BASIC METABOLIC PANEL
Anion gap: 10 (ref 5–15)
BUN: 10 mg/dL (ref 8–23)
CO2: 25 mmol/L (ref 22–32)
CREATININE: 0.98 mg/dL (ref 0.61–1.24)
Calcium: 9.2 mg/dL (ref 8.9–10.3)
Chloride: 104 mmol/L (ref 98–111)
GFR calc Af Amer: >60 mL/min (ref >60)
GFR calc non Af Amer: >60 mL/min (ref >60)
Glucose, Bld: 93 mg/dL (ref 70–99)
Potassium: 3.8 mmol/L (ref 3.5–5.1)
Sodium: 139 mmol/L (ref 135–145)

## 2019-01-01 LAB — CBC
HCT: 39.7 % (ref 39.0–52.0)
Hemoglobin: 13 g/dL (ref 13.0–17.0)
MCH: 29.4 pg (ref 26.0–34.0)
MCHC: 32.7 g/dL (ref 30.0–36.0)
MCV: 89.8 fL (ref 80.0–100.0)
Platelets: 271 10*3/uL (ref 150–400)
RBC: 4.42 MIL/uL (ref 4.22–5.81)
RDW: 14.7 % (ref 11.5–15.5)
WBC: 10.6 10*3/uL — ABNORMAL HIGH (ref 4.0–10.5)
nRBC: 0 % (ref 0.0–0.2)

## 2019-01-01 LAB — URINALYSIS, ROUTINE W REFLEX MICROSCOPIC
Bilirubin Urine: NEGATIVE
Glucose, UA: NEGATIVE mg/dL
Hgb urine dipstick: NEGATIVE
Ketones, ur: NEGATIVE mg/dL
Leukocytes, UA: NEGATIVE
Nitrite: NEGATIVE
Protein, ur: NEGATIVE mg/dL
Specific Gravity, Urine: 1.005 (ref 1.005–1.030)
pH: 6 (ref 5.0–8.0)

## 2019-01-01 LAB — PROTIME-INR
INR: 1.16
Prothrombin Time: 14.7 seconds (ref 11.4–15.2)

## 2019-01-01 LAB — SEDIMENTATION RATE: Sed Rate: 33 mm/hr — ABNORMAL HIGH (ref 0–20)

## 2019-01-01 LAB — TYPE AND SCREEN
ABO/RH(D): AB POS
Antibody Screen: NEGATIVE

## 2019-01-01 LAB — SURGICAL PCR SCREEN
MRSA, PCR: NEGATIVE
Staphylococcus aureus: NEGATIVE

## 2019-01-01 LAB — APTT: aPTT: 31 seconds (ref 24–36)

## 2019-01-01 MED ORDER — DEXTROSE 5 % IV SOLN
3.0000 g | Freq: Once | INTRAVENOUS | Status: AC
  Administered 2019-01-02: 3 g via INTRAVENOUS
  Filled 2019-01-01: qty 3000

## 2019-01-01 NOTE — Patient Instructions (Signed)
Your procedure is scheduled on: Tuesday, January 02, 2019 Report to Day Surgery on the 2nd floor of the [facility]. To find out your arrival time, please call [phone] between 1PM - 3PM on: today, January 13  REMEMBER: Instructions that are not followed completely may result in serious medical risk, up to and including death; or upon the discretion of your surgeon and anesthesiologist your surgery may need to be rescheduled.  Do not eat food after midnight the night before surgery.  No gum chewing, lozengers or hard candies.  You may however, drink CLEAR liquids up to 2 hours before you are scheduled to arrive for your surgery. Do not drink anything within 2 hours of the start of your surgery.  Clear liquids include: - water  - apple juice without pulp - gatorade - black coffee or tea (Do NOT add milk or creamers to the coffee or tea) Do NOT drink anything that is not on this list.  No Alcohol for 24 hours before or after surgery.  No Smoking including e-cigarettes for 24 hours prior to surgery.  No chewable tobacco products for at least 6 hours prior to surgery.  No nicotine patches on the day of surgery.  On the morning of surgery brush your teeth with toothpaste and water, you may rinse your mouth with mouthwash if you wish. Do not swallow any toothpaste or mouthwash.  Notify your doctor if there is any change in your medical condition (cold, fever, infection).  Do not wear jewelry, make-up, hairpins, clips or nail polish.  Do not wear lotions, powders, or perfumes.   Do not shave 48 hours prior to surgery.   Contacts and dentures may not be worn into surgery.  Do not bring valuables to the hospital, including drivers license, insurance or credit cards.  [facility] is not responsible for any belongings or valuables.   TAKE THESE MEDICATIONS THE MORNING OF SURGERY:  1.  Amlodipine 2.  Gabapentin 3.  Hydrocodone (if needed for pain) 4.  Pantoprazole - (take one  the night before and one on the morning of surgery - helps to prevent nausea after surgery.) 5.  Venlafaxine  Use CHG Soap as directed on instruction sheet.  Bring your C-PAP to the hospital with you in case you may have to spend the night.   Follow recommendations from PCP regarding stopping Coumadin. LAST DOSE TAKEN ON January 7.  NOW!  Stop Anti-inflammatories (NSAIDS) such as Advil, Aleve, Ibuprofen, Motrin, Naproxen, Naprosyn and Aspirin based products such as Excedrin, Goodys Powder, BC Powder. (May take Tylenol or Acetaminophen if needed.)  NOW!  Stop ANY OVER THE COUNTER supplements until after surgery. (May continue Vitamin B AND multivitamin.)  Wear comfortable clothing (specific to your surgery type) to the hospital.  Plan for stool softeners for home use.  If you are being admitted to the hospital overnight, leave your suitcase in the car. After surgery it may be brought to your room.  Please call [phone] if you have any questions about these instructions.

## 2019-01-01 NOTE — Pre-Procedure Instructions (Signed)
EKG OK BY DR Kayleen Memos

## 2019-01-02 ENCOUNTER — Inpatient Hospital Stay: Payer: Medicare HMO | Admitting: Anesthesiology

## 2019-01-02 ENCOUNTER — Inpatient Hospital Stay: Payer: Medicare HMO

## 2019-01-02 ENCOUNTER — Encounter
Admission: RE | Disposition: A | Discharge status: Home Health | Payer: Self-pay | Source: Home / Self Care | Attending: Orthopedic Surgery

## 2019-01-02 ENCOUNTER — Encounter: Payer: Self-pay | Admitting: *Deleted

## 2019-01-02 ENCOUNTER — Inpatient Hospital Stay
Admission: RE | Admit: 2019-01-02 | Discharge: 2019-01-05 | DRG: 470 | Disposition: A | Discharge status: Home Health | Payer: Medicare HMO | Attending: Orthopedic Surgery | Admitting: Orthopedic Surgery

## 2019-01-02 ENCOUNTER — Other Ambulatory Visit: Payer: Self-pay

## 2019-01-02 DIAGNOSIS — Z6841 Body Mass Index (BMI) 40.0 and over, adult: Secondary | ICD-10-CM

## 2019-01-02 DIAGNOSIS — G8918 Other acute postprocedural pain: Secondary | ICD-10-CM

## 2019-01-02 DIAGNOSIS — Z91013 Allergy to seafood: Secondary | ICD-10-CM

## 2019-01-02 DIAGNOSIS — Z471 Aftercare following joint replacement surgery: Secondary | ICD-10-CM | POA: Diagnosis not present

## 2019-01-02 DIAGNOSIS — K219 Gastro-esophageal reflux disease without esophagitis: Secondary | ICD-10-CM | POA: Diagnosis present

## 2019-01-02 DIAGNOSIS — Z8249 Family history of ischemic heart disease and other diseases of the circulatory system: Secondary | ICD-10-CM | POA: Diagnosis not present

## 2019-01-02 DIAGNOSIS — Z79899 Other long term (current) drug therapy: Secondary | ICD-10-CM

## 2019-01-02 DIAGNOSIS — Z79891 Long term (current) use of opiate analgesic: Secondary | ICD-10-CM | POA: Diagnosis not present

## 2019-01-02 DIAGNOSIS — Z7901 Long term (current) use of anticoagulants: Secondary | ICD-10-CM | POA: Diagnosis not present

## 2019-01-02 DIAGNOSIS — I10 Essential (primary) hypertension: Secondary | ICD-10-CM | POA: Diagnosis present

## 2019-01-02 DIAGNOSIS — D62 Acute posthemorrhagic anemia: Secondary | ICD-10-CM | POA: Diagnosis not present

## 2019-01-02 DIAGNOSIS — Z888 Allergy status to other drugs, medicaments and biological substances status: Secondary | ICD-10-CM | POA: Diagnosis not present

## 2019-01-02 DIAGNOSIS — Z7952 Long term (current) use of systemic steroids: Secondary | ICD-10-CM

## 2019-01-02 DIAGNOSIS — Z801 Family history of malignant neoplasm of trachea, bronchus and lung: Secondary | ICD-10-CM

## 2019-01-02 DIAGNOSIS — Z85828 Personal history of other malignant neoplasm of skin: Secondary | ICD-10-CM | POA: Diagnosis not present

## 2019-01-02 DIAGNOSIS — M1611 Unilateral primary osteoarthritis, right hip: Principal | ICD-10-CM | POA: Diagnosis present

## 2019-01-02 DIAGNOSIS — Z419 Encounter for procedure for purposes other than remedying health state, unspecified: Secondary | ICD-10-CM

## 2019-01-02 DIAGNOSIS — Z791 Long term (current) use of non-steroidal anti-inflammatories (NSAID): Secondary | ICD-10-CM | POA: Diagnosis not present

## 2019-01-02 DIAGNOSIS — G629 Polyneuropathy, unspecified: Secondary | ICD-10-CM | POA: Diagnosis present

## 2019-01-02 DIAGNOSIS — Z86711 Personal history of pulmonary embolism: Secondary | ICD-10-CM | POA: Diagnosis not present

## 2019-01-02 DIAGNOSIS — G4733 Obstructive sleep apnea (adult) (pediatric): Secondary | ICD-10-CM | POA: Diagnosis present

## 2019-01-02 DIAGNOSIS — Z808 Family history of malignant neoplasm of other organs or systems: Secondary | ICD-10-CM | POA: Diagnosis not present

## 2019-01-02 DIAGNOSIS — Z96641 Presence of right artificial hip joint: Secondary | ICD-10-CM | POA: Diagnosis not present

## 2019-01-02 DIAGNOSIS — E785 Hyperlipidemia, unspecified: Secondary | ICD-10-CM | POA: Diagnosis present

## 2019-01-02 DIAGNOSIS — Z86010 Personal history of colonic polyps: Secondary | ICD-10-CM | POA: Diagnosis not present

## 2019-01-02 DIAGNOSIS — Z833 Family history of diabetes mellitus: Secondary | ICD-10-CM

## 2019-01-02 DIAGNOSIS — G8929 Other chronic pain: Secondary | ICD-10-CM | POA: Diagnosis present

## 2019-01-02 HISTORY — PX: TOTAL HIP ARTHROPLASTY: SHX124

## 2019-01-02 LAB — PROTIME-INR
INR: 1.1
Prothrombin Time: 14.1 seconds (ref 11.4–15.2)

## 2019-01-02 LAB — ABO/RH: ABO/RH(D): AB POS

## 2019-01-02 SURGERY — ARTHROPLASTY, HIP, TOTAL, ANTERIOR APPROACH
Anesthesia: Spinal | Site: Hip | Laterality: Right

## 2019-01-02 MED ORDER — BUPIVACAINE HCL (PF) 0.5 % IJ SOLN
INTRAMUSCULAR | Status: AC
  Filled 2019-01-02: qty 10

## 2019-01-02 MED ORDER — LIDOCAINE HCL (PF) 2 % IJ SOLN
INTRAMUSCULAR | Status: AC
  Filled 2019-01-02: qty 10

## 2019-01-02 MED ORDER — PHENOL 1.4 % MT LIQD
1.0000 | OROMUCOSAL | Status: DC | PRN
  Filled 2019-01-02: qty 177

## 2019-01-02 MED ORDER — ACETAMINOPHEN 500 MG PO TABS
1000.0000 mg | ORAL_TABLET | Freq: Four times a day (QID) | ORAL | Status: AC
  Administered 2019-01-02 – 2019-01-03 (×4): 1000 mg via ORAL
  Filled 2019-01-02 (×5): qty 2

## 2019-01-02 MED ORDER — PROPOFOL 500 MG/50ML IV EMUL
INTRAVENOUS | Status: DC | PRN
  Administered 2019-01-02: 50 ug/kg/min via INTRAVENOUS

## 2019-01-02 MED ORDER — PANTOPRAZOLE SODIUM 40 MG PO TBEC
40.0000 mg | DELAYED_RELEASE_TABLET | Freq: Every day | ORAL | Status: DC
  Administered 2019-01-03 – 2019-01-05 (×3): 40 mg via ORAL
  Filled 2019-01-02 (×3): qty 1

## 2019-01-02 MED ORDER — WARFARIN SODIUM 3 MG PO TABS: 3.0000 mg | ORAL_TABLET | ORAL | Status: DC

## 2019-01-02 MED ORDER — SODIUM CHLORIDE 0.9 % IV SOLN
INTRAVENOUS | Status: DC
  Administered 2019-01-02 (×2): via INTRAVENOUS

## 2019-01-02 MED ORDER — ZOLPIDEM TARTRATE 5 MG PO TABS: 5.0000 mg | ORAL_TABLET | Freq: Every evening | ORAL | Status: DC | PRN

## 2019-01-02 MED ORDER — BENAZEPRIL HCL 10 MG PO TABS
10.0000 mg | ORAL_TABLET | Freq: Every day | ORAL | Status: DC
  Administered 2019-01-02 – 2019-01-05 (×4): 10 mg via ORAL
  Filled 2019-01-02 (×4): qty 1

## 2019-01-02 MED ORDER — NEOMYCIN-POLYMYXIN B GU 40-200000 IR SOLN
Status: AC
  Filled 2019-01-02: qty 4

## 2019-01-02 MED ORDER — MIRABEGRON ER 50 MG PO TB24
50.0000 mg | ORAL_TABLET | Freq: Every day | ORAL | Status: DC
  Administered 2019-01-02 – 2019-01-05 (×4): 50 mg via ORAL
  Filled 2019-01-02 (×4): qty 1

## 2019-01-02 MED ORDER — LIDOCAINE HCL (PF) 2 % IJ SOLN
INTRAMUSCULAR | Status: DC | PRN
  Administered 2019-01-02: 50 mg

## 2019-01-02 MED ORDER — SODIUM CHLORIDE 0.9 % IV SOLN
INTRAVENOUS | Status: DC | PRN
  Administered 2019-01-02: 30 ug/min via INTRAVENOUS

## 2019-01-02 MED ORDER — OXYCODONE HCL 5 MG PO TABS
10.0000 mg | ORAL_TABLET | ORAL | Status: DC | PRN
  Filled 2019-01-02 (×4): qty 2

## 2019-01-02 MED ORDER — KETAMINE HCL 50 MG/ML IJ SOLN
INTRAMUSCULAR | Status: DC | PRN
  Administered 2019-01-02: 50 mg via INTRAVENOUS

## 2019-01-02 MED ORDER — WARFARIN - PHYSICIAN DOSING INPATIENT: Freq: Every day | Status: DC

## 2019-01-02 MED ORDER — ONDANSETRON HCL 4 MG/2ML IJ SOLN: 4.0000 mg | Freq: Once | INTRAMUSCULAR | Status: DC | PRN

## 2019-01-02 MED ORDER — LACTATED RINGERS IV SOLN
INTRAVENOUS | Status: DC
  Administered 2019-01-02 (×2): via INTRAVENOUS

## 2019-01-02 MED ORDER — POTASSIUM CHLORIDE CRYS ER 10 MEQ PO TBCR
10.0000 meq | EXTENDED_RELEASE_TABLET | Freq: Every day | ORAL | Status: DC
  Administered 2019-01-03 – 2019-01-05 (×3): 10 meq via ORAL
  Filled 2019-01-02 (×3): qty 1

## 2019-01-02 MED ORDER — BISACODYL 5 MG PO TBEC
5.0000 mg | DELAYED_RELEASE_TABLET | Freq: Every day | ORAL | Status: DC | PRN
  Administered 2019-01-04: 5 mg via ORAL
  Filled 2019-01-02: qty 1

## 2019-01-02 MED ORDER — GABAPENTIN 300 MG PO CAPS
300.0000 mg | ORAL_CAPSULE | Freq: Two times a day (BID) | ORAL | Status: DC
  Administered 2019-01-02 – 2019-01-05 (×6): 300 mg via ORAL
  Filled 2019-01-02 (×6): qty 1

## 2019-01-02 MED ORDER — MAGNESIUM CITRATE PO SOLN
1.0000 | Freq: Once | ORAL | Status: DC | PRN
  Filled 2019-01-02: qty 296

## 2019-01-02 MED ORDER — MIDAZOLAM HCL 5 MG/5ML IJ SOLN
INTRAMUSCULAR | Status: DC | PRN
  Administered 2019-01-02: 2 mg via INTRAVENOUS

## 2019-01-02 MED ORDER — LIDOCAINE HCL URETHRAL/MUCOSAL 2 % EX GEL
CUTANEOUS | Status: AC
  Filled 2019-01-02: qty 5

## 2019-01-02 MED ORDER — FENTANYL CITRATE (PF) 100 MCG/2ML IJ SOLN: 25.0000 ug | INTRAMUSCULAR | Status: DC | PRN

## 2019-01-02 MED ORDER — MENTHOL 3 MG MT LOZG
1.0000 | LOZENGE | OROMUCOSAL | Status: DC | PRN
  Filled 2019-01-02: qty 9

## 2019-01-02 MED ORDER — WARFARIN SODIUM 6 MG PO TABS
6.0000 mg | ORAL_TABLET | ORAL | Status: DC
  Administered 2019-01-03: 6 mg via ORAL
  Filled 2019-01-02 (×2): qty 1

## 2019-01-02 MED ORDER — KETAMINE HCL 50 MG/ML IJ SOLN
INTRAMUSCULAR | Status: AC
  Filled 2019-01-02: qty 10

## 2019-01-02 MED ORDER — DOCUSATE SODIUM 100 MG PO CAPS
100.0000 mg | ORAL_CAPSULE | Freq: Two times a day (BID) | ORAL | Status: DC
  Administered 2019-01-02 – 2019-01-05 (×6): 100 mg via ORAL
  Filled 2019-01-02 (×6): qty 1

## 2019-01-02 MED ORDER — BUPIVACAINE-EPINEPHRINE (PF) 0.25% -1:200000 IJ SOLN
INTRAMUSCULAR | Status: AC
  Filled 2019-01-02: qty 30

## 2019-01-02 MED ORDER — BUPIVACAINE HCL (PF) 0.5 % IJ SOLN
INTRAMUSCULAR | Status: DC | PRN
  Administered 2019-01-02: 3 mL via INTRATHECAL

## 2019-01-02 MED ORDER — ALUM & MAG HYDROXIDE-SIMETH 200-200-20 MG/5ML PO SUSP: 30.0000 mL | ORAL | Status: DC | PRN

## 2019-01-02 MED ORDER — TRAMADOL HCL 50 MG PO TABS
50.0000 mg | ORAL_TABLET | Freq: Four times a day (QID) | ORAL | Status: DC
  Administered 2019-01-02 – 2019-01-05 (×12): 50 mg via ORAL
  Filled 2019-01-02 (×13): qty 1

## 2019-01-02 MED ORDER — AMLODIPINE BESYLATE 5 MG PO TABS
2.5000 mg | ORAL_TABLET | Freq: Every day | ORAL | Status: DC
  Administered 2019-01-03 – 2019-01-05 (×3): 2.5 mg via ORAL
  Filled 2019-01-02 (×3): qty 1

## 2019-01-02 MED ORDER — ONDANSETRON HCL 4 MG/2ML IJ SOLN
4.0000 mg | Freq: Four times a day (QID) | INTRAMUSCULAR | Status: DC | PRN
  Administered 2019-01-02: 4 mg via INTRAVENOUS
  Filled 2019-01-02: qty 2

## 2019-01-02 MED ORDER — VENLAFAXINE HCL ER 75 MG PO CP24
75.0000 mg | ORAL_CAPSULE | Freq: Two times a day (BID) | ORAL | Status: DC
  Administered 2019-01-02 – 2019-01-05 (×6): 75 mg via ORAL
  Filled 2019-01-02 (×7): qty 1

## 2019-01-02 MED ORDER — PHENYLEPHRINE HCL 10 MG/ML IJ SOLN
INTRAMUSCULAR | Status: DC | PRN
  Administered 2019-01-02 (×5): 100 ug via INTRAVENOUS

## 2019-01-02 MED ORDER — NEOMYCIN-POLYMYXIN B GU 40-200000 IR SOLN
Status: DC | PRN
  Administered 2019-01-02: 4 mL

## 2019-01-02 MED ORDER — BUPIVACAINE-EPINEPHRINE 0.25% -1:200000 IJ SOLN
INTRAMUSCULAR | Status: DC | PRN
  Administered 2019-01-02: 30 mL

## 2019-01-02 MED ORDER — CEFAZOLIN SODIUM-DEXTROSE 2-4 GM/100ML-% IV SOLN
2.0000 g | Freq: Four times a day (QID) | INTRAVENOUS | Status: AC
  Administered 2019-01-02 – 2019-01-03 (×3): 2 g via INTRAVENOUS
  Filled 2019-01-02 (×3): qty 100

## 2019-01-02 MED ORDER — MAGNESIUM HYDROXIDE 400 MG/5ML PO SUSP
30.0000 mL | Freq: Every day | ORAL | Status: DC | PRN
  Administered 2019-01-04: 30 mL via ORAL
  Filled 2019-01-02: qty 30

## 2019-01-02 MED ORDER — ACETAMINOPHEN 325 MG PO TABS
325.0000 mg | ORAL_TABLET | Freq: Four times a day (QID) | ORAL | Status: DC | PRN
  Administered 2019-01-05: 650 mg via ORAL
  Filled 2019-01-02: qty 2

## 2019-01-02 MED ORDER — ADULT MULTIVITAMIN W/MINERALS CH
1.0000 | ORAL_TABLET | Freq: Every day | ORAL | Status: DC
  Administered 2019-01-03 – 2019-01-05 (×3): 1 via ORAL
  Filled 2019-01-02 (×3): qty 1

## 2019-01-02 MED ORDER — PROPOFOL 500 MG/50ML IV EMUL
INTRAVENOUS | Status: AC
  Filled 2019-01-02: qty 50

## 2019-01-02 MED ORDER — PHENYLEPHRINE HCL 10 MG/ML IJ SOLN
INTRAMUSCULAR | Status: AC
  Filled 2019-01-02: qty 1

## 2019-01-02 MED ORDER — OXYCODONE HCL 5 MG PO TABS
5.0000 mg | ORAL_TABLET | ORAL | Status: DC | PRN
  Administered 2019-01-02 – 2019-01-03 (×5): 10 mg via ORAL
  Administered 2019-01-03: 5 mg via ORAL
  Administered 2019-01-03 – 2019-01-05 (×3): 10 mg via ORAL
  Filled 2019-01-02 (×7): qty 2

## 2019-01-02 MED ORDER — HYDROMORPHONE HCL 1 MG/ML IJ SOLN: 0.5000 mg | INTRAMUSCULAR | Status: DC | PRN

## 2019-01-02 MED ORDER — FENTANYL CITRATE (PF) 100 MCG/2ML IJ SOLN
INTRAMUSCULAR | Status: AC
  Filled 2019-01-02: qty 2

## 2019-01-02 MED ORDER — ONDANSETRON HCL 4 MG PO TABS: 4.0000 mg | ORAL_TABLET | Freq: Four times a day (QID) | ORAL | Status: DC | PRN

## 2019-01-02 MED ORDER — METOCLOPRAMIDE HCL 10 MG PO TABS: 5.0000 mg | ORAL_TABLET | Freq: Three times a day (TID) | ORAL | Status: DC | PRN

## 2019-01-02 MED ORDER — METHOCARBAMOL 500 MG PO TABS: 500.0000 mg | ORAL_TABLET | Freq: Four times a day (QID) | ORAL | Status: DC | PRN

## 2019-01-02 MED ORDER — TORSEMIDE 20 MG PO TABS
10.0000 mg | ORAL_TABLET | Freq: Every day | ORAL | Status: DC
  Administered 2019-01-02 – 2019-01-05 (×4): 10 mg via ORAL
  Filled 2019-01-02 (×4): qty 1

## 2019-01-02 MED ORDER — VITAMIN B-12 1000 MCG PO TABS
1000.0000 ug | ORAL_TABLET | Freq: Every day | ORAL | Status: DC
  Administered 2019-01-03 – 2019-01-05 (×3): 1000 ug via ORAL
  Filled 2019-01-02 (×3): qty 1

## 2019-01-02 MED ORDER — METHOCARBAMOL 1000 MG/10ML IJ SOLN
500.0000 mg | Freq: Four times a day (QID) | INTRAVENOUS | Status: DC | PRN
  Filled 2019-01-02: qty 5

## 2019-01-02 MED ORDER — FENTANYL CITRATE (PF) 100 MCG/2ML IJ SOLN
INTRAMUSCULAR | Status: DC | PRN
  Administered 2019-01-02: 50 ug via INTRAVENOUS

## 2019-01-02 MED ORDER — GLYCOPYRROLATE 0.2 MG/ML IJ SOLN
INTRAMUSCULAR | Status: AC
  Filled 2019-01-02: qty 1

## 2019-01-02 MED ORDER — GLYCOPYRROLATE 0.2 MG/ML IJ SOLN
INTRAMUSCULAR | Status: DC | PRN
  Administered 2019-01-02: 0.2 mg via INTRAVENOUS

## 2019-01-02 MED ORDER — ALLOPURINOL 300 MG PO TABS
300.0000 mg | ORAL_TABLET | Freq: Every day | ORAL | Status: DC
  Administered 2019-01-02 – 2019-01-05 (×4): 300 mg via ORAL
  Filled 2019-01-02 (×4): qty 1

## 2019-01-02 MED ORDER — DIPHENHYDRAMINE HCL 12.5 MG/5ML PO ELIX: 12.5000 mg | ORAL_SOLUTION | ORAL | Status: DC | PRN

## 2019-01-02 MED ORDER — MIDAZOLAM HCL 2 MG/2ML IJ SOLN
INTRAMUSCULAR | Status: AC
  Filled 2019-01-02: qty 2

## 2019-01-02 MED ORDER — METOCLOPRAMIDE HCL 5 MG/ML IJ SOLN: 5.0000 mg | Freq: Three times a day (TID) | INTRAMUSCULAR | Status: DC | PRN

## 2019-01-02 MED ORDER — WARFARIN SODIUM 3 MG PO TABS
3.0000 mg | ORAL_TABLET | ORAL | Status: DC
  Administered 2019-01-02 – 2019-01-04 (×2): 3 mg via ORAL
  Filled 2019-01-02 (×2): qty 1

## 2019-01-02 MED ORDER — ROSUVASTATIN CALCIUM 10 MG PO TABS
5.0000 mg | ORAL_TABLET | Freq: Every day | ORAL | Status: DC
  Administered 2019-01-02 – 2019-01-05 (×4): 5 mg via ORAL
  Filled 2019-01-02 (×4): qty 1

## 2019-01-02 MED ORDER — EPHEDRINE SULFATE 50 MG/ML IJ SOLN
INTRAMUSCULAR | Status: AC
  Filled 2019-01-02: qty 1

## 2019-01-02 SURGICAL SUPPLY — 60 items
BLADE SAGITTAL AGGR TOOTH XLG (BLADE) ×3 IMPLANT
BNDG COHESIVE 6X5 TAN STRL LF (GAUZE/BANDAGES/DRESSINGS) ×9 IMPLANT
CANISTER SUCT 1200ML W/VALVE (MISCELLANEOUS) ×3 IMPLANT
CHLORAPREP W/TINT 26ML (MISCELLANEOUS) ×3 IMPLANT
COVER WAND RF STERILE (DRAPES) ×3 IMPLANT
CUP ACETAB VERSA DBL 28X58 DMI (Orthopedic Implant) ×2 IMPLANT
DRAPE C-ARM XRAY 36X54 (DRAPES) ×3 IMPLANT
DRAPE INCISE IOBAN 66X60 STRL (DRAPES) IMPLANT
DRAPE POUCH INSTRU U-SHP 10X18 (DRAPES) ×3 IMPLANT
DRAPE SHEET LG 3/4 BI-LAMINATE (DRAPES) ×9 IMPLANT
DRAPE TABLE BACK 80X90 (DRAPES) ×3 IMPLANT
DRESSING SURGICEL FIBRLLR 1X2 (HEMOSTASIS) ×2 IMPLANT
DRSG OPSITE POSTOP 4X8 (GAUZE/BANDAGES/DRESSINGS) ×6 IMPLANT
DRSG SURGICEL FIBRILLAR 1X2 (HEMOSTASIS) ×6
ELECT BLADE 6.5 EXT (BLADE) ×3 IMPLANT
ELECT REM PT RETURN 9FT ADLT (ELECTROSURGICAL) ×3
ELECTRODE REM PT RTRN 9FT ADLT (ELECTROSURGICAL) ×1 IMPLANT
GLOVE BIOGEL PI IND STRL 9 (GLOVE) ×1 IMPLANT
GLOVE BIOGEL PI INDICATOR 9 (GLOVE) ×2
GLOVE SURG SYN 9.0  PF PI (GLOVE) ×4
GLOVE SURG SYN 9.0 PF PI (GLOVE) ×2 IMPLANT
GOWN SRG 2XL LVL 4 RGLN SLV (GOWNS) ×1 IMPLANT
GOWN STRL NON-REIN 2XL LVL4 (GOWNS) ×2
GOWN STRL REUS W/ TWL LRG LVL3 (GOWN DISPOSABLE) ×1 IMPLANT
GOWN STRL REUS W/TWL LRG LVL3 (GOWN DISPOSABLE) ×2
HEMOVAC 400CC 10FR (MISCELLANEOUS) IMPLANT
HIP FEM HD L 28 (Head) ×2 IMPLANT
HOLDER FOLEY CATH W/STRAP (MISCELLANEOUS) ×3 IMPLANT
HOOD PEEL AWAY FLYTE STAYCOOL (MISCELLANEOUS) ×3 IMPLANT
KIT PREVENA INCISION MGT 13 (CANNISTER) ×3 IMPLANT
MAT ABSORB  FLUID 56X50 GRAY (MISCELLANEOUS) ×2
MAT ABSORB FLUID 56X50 GRAY (MISCELLANEOUS) ×1 IMPLANT
NDL SAFETY ECLIPSE 18X1.5 (NEEDLE) ×1 IMPLANT
NDL SPNL 18GX3.5 QUINCKE PK (NEEDLE) ×1 IMPLANT
NEEDLE HYPO 18GX1.5 SHARP (NEEDLE) ×2
NEEDLE SPNL 18GX3.5 QUINCKE PK (NEEDLE) ×3 IMPLANT
NS IRRIG 1000ML POUR BTL (IV SOLUTION) ×3 IMPLANT
PACK HIP COMPR (MISCELLANEOUS) ×3 IMPLANT
PENCIL SMOKE ULTRAEVAC 22 CON (MISCELLANEOUS) ×3 IMPLANT
SCALPEL PROTECTED #10 DISP (BLADE) ×6 IMPLANT
SHELL ACETABULAR SZ0 58MM (Shell) ×2 IMPLANT
SOL PREP PVP 2OZ (MISCELLANEOUS) ×3
SOLUTION PREP PVP 2OZ (MISCELLANEOUS) ×1 IMPLANT
SPONGE DRAIN TRACH 4X4 STRL 2S (GAUZE/BANDAGES/DRESSINGS) ×3 IMPLANT
STAPLER SKIN PROX 35W (STAPLE) ×3 IMPLANT
STEM FEMORAL 4 STD COLLARED (Stem) ×2 IMPLANT
STRAP SAFETY 5IN WIDE (MISCELLANEOUS) ×3 IMPLANT
SUT DVC 2 QUILL PDO  T11 36X36 (SUTURE) ×2
SUT DVC 2 QUILL PDO T11 36X36 (SUTURE) ×1 IMPLANT
SUT SILK 0 (SUTURE) ×2
SUT SILK 0 30XBRD TIE 6 (SUTURE) ×1 IMPLANT
SUT V-LOC 90 ABS DVC 3-0 CL (SUTURE) ×3 IMPLANT
SUT VIC AB 1 CT1 36 (SUTURE) ×3 IMPLANT
SYR 20CC LL (SYRINGE) ×3 IMPLANT
SYR 30ML LL (SYRINGE) ×3 IMPLANT
SYR BULB IRRIG 60ML STRL (SYRINGE) ×3 IMPLANT
TAPE MICROFOAM 4IN (TAPE) ×3 IMPLANT
TOWEL OR 17X26 4PK STRL BLUE (TOWEL DISPOSABLE) ×3 IMPLANT
TRAY FOLEY MTR SLVR 16FR STAT (SET/KITS/TRAYS/PACK) ×3 IMPLANT
WND VAC CANISTER 500ML (MISCELLANEOUS) ×3 IMPLANT

## 2019-01-02 NOTE — Evaluation (Signed)
Physical Therapy Evaluation Patient Details Name: Adrian [name] MRN: [id number] DOB: 05/08/1946 Today's Date: 01/02/2019   History of Present Illness  Pt is a 73 y/o M s/p R THA.  Pt's PMH includes lumbar radiculopathy, neuropathy, PE.   Clinical Impression  Pt is s/p THA resulting in the deficits listed below (see PT Problem List). Adrian [name] required min assist for bed mobility and min guard assist for stand pivot transfer as well as to ambulate short distance in room. Pt will benefit from skilled PT to increase their independence and safety with mobility to allow discharge to the venue listed below.     Follow Up Recommendations Outpatient PT    Equipment Recommendations  None recommended by PT    Recommendations for Other Services       Precautions / Restrictions Precautions Precautions: Fall;Other (comment) Precaution Comments: direct anterior, no hip precautions Restrictions Weight Bearing Restrictions: Yes RLE Weight Bearing: Partial weight bearing RLE Partial Weight Bearing Percentage or Pounds: 50      Mobility  Bed Mobility Overal bed mobility: Needs Assistance Bed Mobility: Supine to Sit     Supine to sit: Min assist;HOB elevated     General bed mobility comments: Min assist to advance RLE to EOB.  Pt with increased effort and time.   Transfers Overall transfer level: Needs assistance Equipment used: Judie Petit RW) Transfers: Sit to/from [employer] to Stand: Min guard Stand pivot transfers: [employer] transfer comment: Cues for proper hand placement and safe technique.  Pt remains steady using RW.    Ambulation/Gait Ambulation/Gait assistance: Min guard Gait Distance (Feet): 15 Feet Assistive device: (Bari RW) Gait Pattern/deviations: Decreased step length - left;Step-to pattern;Decreased weight shift to right;Antalgic Gait velocity: decreased   General Gait Details: Pt remains steady while ambulating.  Pt ambulates  forward and back.   Stairs            Wheelchair Mobility    Modified Rankin (Stroke Patients Only)       Balance Overall balance assessment: Needs assistance Sitting-balance support: No upper extremity supported;Feet supported Sitting balance-Leahy Scale: Good     Standing balance support: Bilateral upper extremity supported;During functional activity Standing balance-Leahy Scale: Poor Standing balance comment: Pt relies on BUE support for static and dynamic activities                             Pertinent Vitals/Pain Pain Assessment: Faces Faces Pain Scale: Hurts even more Pain Location: R hip Pain Descriptors / Indicators: Grimacing;Guarding;Moaning Pain Intervention(s): Limited activity within patient's tolerance;Monitored during session;RN gave pain meds during session;Repositioned;Utilized relaxation techniques;Ice applied    Home Living Family/patient expects to be discharged to:: Private residence Living Arrangements: Spouse/significant other Available Help at Discharge: Family;Available 24 hours/day Type of Home: House([place]) Home Access: Stairs to enter Entrance Stairs-Rails: Left;Right;Can reach both Entrance Stairs-Number of Steps: 5 Home Layout: One level Home Equipment: Grab bars - tub/shower;Shower seat - built in;Hand held shower head(pt describes Bari RW)      Prior Function Level of Independence: Independent         Comments: Suspect pt was ambulating without AD but pt did not specify.  No falls in the past 6 months.  Ind with bathing, dressing.  Wife does the cooking, cleaning.       Hand Dominance        Extremity/Trunk Assessment   Upper Extremity Assessment Upper  Extremity Assessment: Overall WFL for tasks assessed    Lower Extremity Assessment Lower Extremity Assessment: RLE deficits/detail RLE Deficits / Details: Unable to formally assess s/p R THA.  Functionally strength grossly at least 3/5.         Communication   Communication: No difficulties  Cognition Arousal/Alertness: Awake/alert Behavior During Therapy: WFL for tasks assessed/performed Overall Cognitive Status: Within Functional Limits for tasks assessed                                        General Comments General comments (skin integrity, edema, etc.): BP in supine 105/60, sitting EOB 100/63.     Exercises Total Joint Exercises Ankle Circles/Pumps: AROM;Both;10 reps;Supine Quad Sets: Strengthening;Both;10 reps;Supine   Assessment/Plan    PT Assessment Patient needs continued PT services  PT Problem List Decreased strength;Decreased range of motion;Decreased activity tolerance;Decreased balance;Decreased knowledge of use of DME;Decreased safety awareness;Pain       PT Treatment Interventions DME instruction;Gait training;Stair training;Functional mobility training;Therapeutic activities;Therapeutic exercise;Balance training;Patient/family education;Modalities;Neuromuscular re-education    PT Goals (Current goals can be found in the Care Plan section)  Acute Rehab PT Goals Patient Stated Goal: to be as independent as possible PT Goal Formulation: With patient Time For Goal Achievement: 01/16/19 Potential to Achieve Goals: Good    Frequency BID   Barriers to discharge        Co-evaluation               AM-PAC PT "6 Clicks" Mobility  Outcome Measure Help needed turning from your back to your side while in a flat bed without using bedrails?: A Little Help needed moving from lying on your back to sitting on the side of a flat bed without using bedrails?: A Little Help needed moving to and from a bed to a chair (including a wheelchair)?: A Little Help needed standing up from a chair using your arms (e.g., wheelchair or bedside chair)?: A Little Help needed to walk in hospital room?: A Little Help needed climbing 3-5 steps with a railing? : A Lot 6 Click Score: 17    End of Session  Equipment Utilized During Treatment: Gait belt Activity Tolerance: Patient tolerated treatment well Patient left: in chair;with call bell/phone within reach;with chair alarm set;with family/visitor present;with SCD's reapplied Nurse Communication: Mobility status;Weight bearing status PT Visit Diagnosis: Pain;Unsteadiness on feet (R26.81);Other abnormalities of gait and mobility (R26.89);Muscle weakness (generalized) (M62.81) Pain - Right/Left: Right Pain - part of body: Hip    Time: 1528-1600 PT Time Calculation (min) (ACUTE ONLY): 32 min   Charges:   PT Evaluation $PT Eval Low Complexity: 1 Low PT Treatments $Therapeutic Exercise: 8-22 mins $Therapeutic Activity: 8-22 mins       Collie Siad PT, DPT 01/02/2019, 4:36 PM

## 2019-01-02 NOTE — H&P (Signed)
Reviewed paper H+P, will be scanned into chart. No changes noted.  

## 2019-01-02 NOTE — Op Note (Signed)
01/02/2019  10:08 AM  PATIENT:  Adrian [name]  73 y.o. male  PRE-OPERATIVE DIAGNOSIS:  PRIMARY OSTEOARTHRITIS OF RIGHT HIP  POST-OPERATIVE DIAGNOSIS:  PRIMARY OSTEOARTHRITIS OF RIGHT HIP  PROCEDURE:  Procedure(s): TOTAL HIP ARTHROPLASTY ANTERIOR APPROACH (Right)  SURGEON: Laurene Footman, MD  ASSISTANTS: None  ANESTHESIA:   spinal  EBL:  Total I/O In: 1000 [I.V.:1000] Out: 250 [Urine:50; Blood:200]  BLOOD ADMINISTERED:none  DRAINS: none   LOCAL MEDICATIONS USED:  MARCAINE     SPECIMEN:  Source of Specimen:  Right femoral head  DISPOSITION OF SPECIMEN:  PATHOLOGY  COUNTS:  YES  TOURNIQUET:  * No tourniquets in log *  IMPLANTS: Medacta AMIS 4 stem standard, 58 mm Mpact TM cup with liner and L metal 28 mm head  DICTATION: .Dragon Dictation   The patient was brought to the operating room and after spinal anesthesia was obtained patient was placed on the operative table with the ipsilateral foot into the Medacta attachment, contralateral leg on a well-padded table. C-arm was brought in and preop template x-ray taken. After prepping and draping in usual sterile fashion appropriate patient identification and timeout procedures were completed. Anterior approach to the hip was obtained and centered over the greater trochanter and TFL muscle. The subcutaneous tissue was incised hemostasis being achieved by electrocautery. TFL fascia was incised and the muscle retracted laterally deep retractor placed. The lateral femoral circumflex vessels were identified and ligated. The anterior capsule was exposed and a capsulotomy performed. The neck was identified and a femoral neck cut carried out with a saw. The head was removed without difficulty and showed sclerotic femoral head and acetabulum. Reaming was carried out to 56 mm and a 58 mm cup trial gave appropriate tightness to the acetabular component a 58 DM cup was impacted into position. The leg was then externally rotated and ischiofemoral  and pubofemoral releases carried out. The femur was sequentially broached to a size 4, size 4 standard with S head trials were placed and the final components chosen. The 4 standard stem was inserted along with a metal L 28 mm head and 58 mm liner. The hip was reduced and was stable the wound was thoroughly irrigated with fibrillar placed along the posterior capsule and medial neck. The deep fascia ws closed using a heavy Quill after infiltration of 30 cc of quarter percent Sensorcaine with epinephrine.3-0 V-loc to close the skin with skin staples.  Incisional wound VAC applied, patient was sent to recovery in stable condition.   PLAN OF CARE: Admit to inpatient

## 2019-01-02 NOTE — Anesthesia Procedure Notes (Signed)
Spinal  Patient location during procedure: OR Staffing Anesthesiologist: Kephart, William K, MD Resident/CRNA: Zacharia Sowles, CRNA Performed: resident/CRNA  Preanesthetic Checklist Completed: patient identified, site marked, surgical consent, pre-op evaluation, timeout performed, IV checked, risks and benefits discussed and monitors and equipment checked Spinal Block Patient position: sitting Prep: ChloraPrep and site prepped and draped Patient monitoring: heart rate, continuous pulse ox, blood pressure and cardiac monitor Approach: midline Location: L4-5 Injection technique: single-shot Needle Needle type: Introducer and Pencan  Needle gauge: 24 G Needle length: 9 cm Additional Notes Negative paresthesia. Negative blood return. Positive free-flowing CSF. Expiration date of kit checked and confirmed. Patient tolerated procedure well, without complications.       

## 2019-01-02 NOTE — Plan of Care (Signed)
  Problem: Education: Goal: Knowledge of General Education information will improve Description Including pain rating scale, medication(s)/side effects and non-pharmacologic comfort measures Outcome: Progressing   

## 2019-01-02 NOTE — Transfer of Care (Signed)
Immediate Anesthesia Transfer of Care Note  Patient: Adrian [name]  Procedure(s) Performed: TOTAL HIP ARTHROPLASTY ANTERIOR APPROACH (Right Hip)  Patient Location: PACU  Anesthesia Type:Spinal  Level of Consciousness: awake, alert  and oriented  Airway & Oxygen Therapy: Patient Spontanous Breathing and Patient connected to face mask oxygen  Post-op Assessment: Report given to RN and Post -op Vital signs reviewed and stable  Post vital signs: Reviewed  Last Vitals:  Vitals Value Taken Time  BP 78/43 01/02/2019 10:10 AM  Temp    Pulse 70 01/02/2019 10:11 AM  Resp 18 01/02/2019 10:11 AM  SpO2 100 % 01/02/2019 10:11 AM  Vitals shown include unvalidated device data.  Last Pain:  Vitals:   01/02/19 0723  TempSrc: Oral  PainSc: 4          Complications: No apparent anesthesia complications

## 2019-01-02 NOTE — Anesthesia Post-op Follow-up Note (Signed)
Anesthesia QCDR form completed.        

## 2019-01-02 NOTE — NC FL2 (Signed)
[place] LEVEL OF CARE SCREENING TOOL     IDENTIFICATION  Patient Name: SAABIR BLYTH Birthdate: 1946-02-09 Sex: male Admission Date (Current Location): 01/02/2019  [place] and [place] Number:  Engineering geologist and Address:  [facility], [address] [place], [place] [address]      Provider Number: [id number]  Attending Physician Name and Address:  Hessie Knows, MD  Relative Name and Phone Number:       Current Level of Care: Hospital Recommended Level of Care: [place] Prior Approval Number:    Date Approved/Denied:   PASRR Number: [id number])  Discharge Plan: SNF    Current Diagnoses: Patient Active Problem List   Diagnosis Date Noted  . Status post total hip replacement, right 01/02/2019  . Chest pain 08/23/2016  . Near syncope 08/23/2016  . Osteoarthritis of both hips 03/08/2016  . Arthritis of knee, degenerative 02/27/2016  . Elevated sedimentation rate 02/11/2016  . Elevated C-reactive protein (CRP) 02/11/2016  . Abnormal MRI, lumbar spine (02/03/2016) 02/10/2016  . Lumbar spondylosis 02/10/2016  . Abnormal NCS (nerve conduction studies) 02/10/2016  . Chronic anticoagulation (Coumadin and Plaquenil) (due to pulmonary embolism) 02/09/2016  . Chronic low back pain (Location of Tertiary source of pain) (Right) 02/09/2016  . Chronic pain 12/10/2015  . Neuropathy ([place]) (lower extremity peripheral neuropathy diagnosed by EMG and PNCV done by Dr. Melrose Nakayama) 12/10/2015  . Chronic lower extremity pain (Location of Primary Source of Pain) (Bilateral) (R>L) 12/10/2015  . Chronic hand pain (Bilateral) (neuropathy) 12/10/2015  . Chronic knee pain (Location of Secondary source of pain) (Bilateral) (R>L) 12/10/2015  . Inflammatory pain 12/10/2015  . Neuropathic pain 12/10/2015  . Neurogenic pain 12/10/2015  . Chronic lumbar radicular pain (S1 Dermatome) (Right) 12/10/2015  . Long term current use of opiate  analgesic 12/10/2015  . Long term prescription opiate use 12/10/2015  . Opiate use 12/10/2015  . Bleeding gastrointestinal 12/10/2015  . HLD (hyperlipidemia) 12/10/2015  . History of pulmonary embolism x 2 12/10/2015  . Chronic lumbar radiculopathy (Right) 12/10/2015  . Diffuse myofascial pain syndrome 12/10/2015  . Encounter for therapeutic drug level monitoring 12/10/2015  . Encounter for chronic pain management 12/10/2015  . Encounter for pain management planning 12/10/2015  . Mixed simple and mucopurulent chronic bronchitis ([place]) 03/27/2015  . Loss of feeling or sensation 02/11/2015  . Polyneuropathy 02/11/2015  . Low serum cobalamin 11/11/2014  . Intermittent claudication ([place]) 09/25/2014  . Major depression in remission ([place]) 09/25/2014  . Lumbar canal stenosis (Severe: L4-5) (Moderate: L3-4 and L5-S1) (Mild: L1-2, L2-3) 09/25/2014  . Morbid (severe) obesity due to excess calories ([place]) 09/25/2014  . Morbid obesity with BMI of 40.0-44.9, adult ([place]) 09/25/2014  . Anxiety 05/25/2014  . Chronic headache 05/25/2014  . Acid reflux 05/25/2014  . Benign hypertension 05/25/2014  . Blood glucose elevated 05/25/2014  . Obstructive apnea 05/25/2014  . Pulmonary embolism ([place]) 05/25/2014    Orientation RESPIRATION BLADDER Height & Weight     Self, Time, Situation, Place  Normal Continent Weight: 290 lb (131.5 kg) Height:  5\' 9"  (175.3 cm)  BEHAVIORAL SYMPTOMS/MOOD NEUROLOGICAL BOWEL NUTRITION STATUS      Continent Diet(Diet: Regular )  AMBULATORY STATUS COMMUNICATION OF NEEDS Skin   Extensive Assist Verbally Surgical wounds, Wound Vac(Incision: Right Hip, Provena Wound Vac. )                       Personal Care Assistance Level of Assistance  Bathing,  Feeding, Dressing Bathing Assistance: Limited assistance Feeding assistance: Independent Dressing Assistance: Limited assistance     Functional Limitations Info  Sight, Hearing, Speech Sight Info: Adequate Hearing Info:  Adequate Speech Info: Adequate    SPECIAL CARE FACTORS FREQUENCY  PT (By licensed PT), OT (By licensed OT)     PT Frequency: (5) OT Frequency: (5)            Contractures      Additional Factors Info  Code Status, Allergies Code Status Info: (Full Code. ) Allergies Info: (Duloxetine, Pseudoephedrine Hcl, Shellfish Allergy)           Current Medications (01/02/2019):  This is the current hospital active medication list Current Facility-Administered Medications  Medication Dose Route Frequency Provider Last Rate Last Dose  . 0.9 %  sodium chloride infusion   Intravenous Continuous Hessie Knows, MD 100 mL/hr at 01/02/19 1144    . acetaminophen (TYLENOL) tablet 1,000 mg  1,000 mg Oral Q6H Hessie Knows, MD   1,000 mg at 01/02/19 1243  . [START ON 01/03/2019] acetaminophen (TYLENOL) tablet 325-650 mg  325-650 mg Oral Q6H PRN Hessie Knows, MD      . allopurinol (ZYLOPRIM) tablet 300 mg  300 mg Oral Daily Hessie Knows, MD   300 mg at 01/02/19 1140  . alum & mag hydroxide-simeth (MAALOX/MYLANTA) 200-200-20 MG/5ML suspension 30 mL  30 mL Oral Q4H PRN Hessie Knows, MD      . Derrill Memo ON 01/03/2019] amLODipine (NORVASC) tablet 2.5 mg  2.5 mg Oral Daily Hessie Knows, MD      . benazepril (LOTENSIN) tablet 10 mg  10 mg Oral Daily Hessie Knows, MD   10 mg at 01/02/19 1140  . bisacodyl (DULCOLAX) EC tablet 5 mg  5 mg Oral Daily PRN Hessie Knows, MD      . ceFAZolin (ANCEF) IVPB 2g/100 mL premix  2 g Intravenous Q6H Hessie Knows, MD 200 mL/hr at 01/02/19 1358 2 g at 01/02/19 1358  . diphenhydrAMINE (BENADRYL) 12.5 MG/5ML elixir 12.5-25 mg  12.5-25 mg Oral Q4H PRN Hessie Knows, MD      . docusate sodium (COLACE) capsule 100 mg  100 mg Oral BID Hessie Knows, MD      . gabapentin (NEURONTIN) capsule 300 mg  300 mg Oral BID Hessie Knows, MD      . HYDROmorphone (DILAUDID) injection 0.5-1 mg  0.5-1 mg Intravenous Q4H PRN Hessie Knows, MD      . magnesium citrate solution 1 Bottle  1  Bottle Oral Once PRN Hessie Knows, MD      . magnesium hydroxide (MILK OF MAGNESIA) suspension 30 mL  30 mL Oral Daily PRN Hessie Knows, MD      . menthol-cetylpyridinium (CEPACOL) lozenge 3 mg  1 lozenge Oral PRN Hessie Knows, MD       Or  . phenol (CHLORASEPTIC) mouth spray 1 spray  1 spray Mouth/Throat PRN Hessie Knows, MD      . methocarbamol (ROBAXIN) tablet 500 mg  500 mg Oral Q6H PRN Hessie Knows, MD       Or  . methocarbamol (ROBAXIN) 500 mg in dextrose 5 % 50 mL IVPB  500 mg Intravenous Q6H PRN Hessie Knows, MD      . metoCLOPramide (REGLAN) tablet 5-10 mg  5-10 mg Oral Q8H PRN Hessie Knows, MD       Or  . metoCLOPramide (REGLAN) injection 5-10 mg  5-10 mg Intravenous Q8H PRN Hessie Knows, MD      . mirabegron ER (  MYRBETRIQ) tablet 50 mg  50 mg Oral Daily Hessie Knows, MD   50 mg at 01/02/19 1140  . multivitamin with minerals tablet 1 tablet  1 tablet Oral Daily Hessie Knows, MD      . ondansetron [facility]) tablet 4 mg  4 mg Oral Q6H PRN Hessie Knows, MD       Or  . ondansetron [facility]) injection 4 mg  4 mg Intravenous Q6H PRN Hessie Knows, MD   4 mg at 01/02/19 1544  . oxyCODONE (Oxy IR/ROXICODONE) immediate release tablet 10-15 mg  10-15 mg Oral Q4H PRN Hessie Knows, MD      . oxyCODONE (Oxy IR/ROXICODONE) immediate release tablet 5-10 mg  5-10 mg Oral Q4H PRN Hessie Knows, MD   10 mg at 01/02/19 1539  . [START ON 01/03/2019] pantoprazole (PROTONIX) EC tablet 40 mg  40 mg Oral Daily Hessie Knows, MD      . potassium chloride (K-DUR,KLOR-CON) CR tablet 10 mEq  10 mEq Oral Daily Hessie Knows, MD      . rosuvastatin (CRESTOR) tablet 5 mg  5 mg Oral Daily Hessie Knows, MD      . torsemide [facility]) tablet 10 mg  10 mg Oral Daily Hessie Knows, MD      . traMADol Veatrice Bourbon) tablet 50 mg  50 mg Oral Q6H Hessie Knows, MD   50 mg at 01/02/19 1243  . venlafaxine XR (EFFEXOR-XR) 24 hr capsule 75 mg  75 mg Oral BID Hessie Knows, MD      . vitamin B-12 (CYANOCOBALAMIN) tablet  1,000 mcg  1,000 mcg Oral Daily Hessie Knows, MD      . warfarin (COUMADIN) tablet 3 mg  3 mg Oral Once per day on Tue Thu Menz, Michael, MD       And  . Derrill Memo ON 01/03/2019] warfarin (COUMADIN) tablet 6 mg  6 mg Oral Once per day on Sun Mon Wed Fri Sat Hessie Knows, MD      . Warfarin - Physician Dosing Inpatient   Does not apply q1800 Hessie Knows, MD      . zolpidem Lorrin Mais) tablet 5 mg  5 mg Oral QHS PRN Hessie Knows, MD         Discharge Medications: Please see discharge summary for a list of discharge medications.  Relevant Imaging Results:  Relevant Lab Results:   Additional Information (SSN: [id number])  Kavish [place], Veronia Beets, LCSW

## 2019-01-02 NOTE — Anesthesia Preprocedure Evaluation (Signed)
Anesthesia Evaluation  Patient identified by MRN, date of birth, ID band Patient awake    Reviewed: Allergy & Precautions, NPO status , Patient's Chart, lab work & pertinent test results  History of Anesthesia Complications Negative for: history of anesthetic complications  Airway Mallampati: III       Dental  (+) Upper Dentures, Partial Lower   Pulmonary sleep apnea and Continuous Positive Airway Pressure Ventilation , neg COPD, Current Smoker,           Cardiovascular hypertension, Pt. on medications (-) Past MI and (-) CHF (-) dysrhythmias (-) Valvular Problems/Murmurs     Neuro/Psych neg Seizures Anxiety Depression    GI/Hepatic Neg liver ROS, GERD  Medicated and Controlled,  Endo/Other  neg diabetesMorbid obesity  Renal/GU negative Renal ROS     Musculoskeletal   Abdominal   Peds  Hematology   Anesthesia Other Findings   Reproductive/Obstetrics                             Anesthesia Physical Anesthesia Plan  ASA: III  Anesthesia Plan: Spinal   Post-op Pain Management:    Induction:   PONV Risk Score and Plan:   Airway Management Planned:   Additional Equipment:   Intra-op Plan:   Post-operative Plan:   Informed Consent: I have reviewed the patients History and Physical, chart, labs and discussed the procedure including the risks, benefits and alternatives for the proposed anesthesia with the patient or authorized representative who has indicated his/her understanding and acceptance.     Plan Discussed with:   Anesthesia Plan Comments:         Anesthesia Quick Evaluation

## 2019-01-03 ENCOUNTER — Encounter: Payer: Self-pay | Admitting: Orthopedic Surgery

## 2019-01-03 LAB — BASIC METABOLIC PANEL
Anion gap: 7 (ref 5–15)
BUN: 10 mg/dL (ref 8–23)
CO2: 25 mmol/L (ref 22–32)
Calcium: 8.1 mg/dL — ABNORMAL LOW (ref 8.9–10.3)
Chloride: 101 mmol/L (ref 98–111)
Creatinine, Ser: 0.88 mg/dL (ref 0.61–1.24)
GFR calc Af Amer: >60 mL/min (ref >60)
GFR calc non Af Amer: >60 mL/min (ref >60)
Glucose, Bld: 113 mg/dL — ABNORMAL HIGH (ref 70–99)
POTASSIUM: 3.5 mmol/L (ref 3.5–5.1)
Sodium: 133 mmol/L — ABNORMAL LOW (ref 135–145)

## 2019-01-03 LAB — URINE CULTURE: Culture: NO GROWTH

## 2019-01-03 LAB — PROTIME-INR
INR: 1.18
PROTHROMBIN TIME: 14.9 s (ref 11.4–15.2)

## 2019-01-03 LAB — CBC
HCT: 30.6 % — ABNORMAL LOW (ref 39.0–52.0)
Hemoglobin: 10 g/dL — ABNORMAL LOW (ref 13.0–17.0)
MCH: 29.3 pg (ref 26.0–34.0)
MCHC: 32.7 g/dL (ref 30.0–36.0)
MCV: 89.7 fL (ref 80.0–100.0)
Platelets: 209 10*3/uL (ref 150–400)
RBC: 3.41 MIL/uL — ABNORMAL LOW (ref 4.22–5.81)
RDW: 14.8 % (ref 11.5–15.5)
WBC: 8.7 10*3/uL (ref 4.0–10.5)
nRBC: 0.2 % (ref 0.0–0.2)

## 2019-01-03 LAB — SURGICAL PATHOLOGY

## 2019-01-03 MED ORDER — ENOXAPARIN SODIUM 40 MG/0.4ML ~~LOC~~ SOLN
40.0000 mg | Freq: Two times a day (BID) | SUBCUTANEOUS | Status: DC
  Administered 2019-01-03 – 2019-01-05 (×4): 40 mg via SUBCUTANEOUS
  Filled 2019-01-03 (×4): qty 0.4

## 2019-01-03 MED ORDER — ENOXAPARIN SODIUM 40 MG/0.4ML ~~LOC~~ SOLN
40.0000 mg | SUBCUTANEOUS | Status: DC
  Administered 2019-01-03: 40 mg via SUBCUTANEOUS
  Filled 2019-01-03: qty 0.4

## 2019-01-03 NOTE — Progress Notes (Signed)
Physical Therapy Treatment Patient Details Name: Adrian [name] MRN: [id number] DOB: 1946/03/10 Today's Date: 01/03/2019    History of Present Illness Pt is a 73 y/o M s/p R THA.  Pt's PMH includes lumbar radiculopathy, neuropathy, PE.     PT Comments    [name] made good progress toward mobility goals.  He ambulated 160 ft with RW with cues for improved posture and management of RW.  Of note, SpO2 down as low as 89% with SOB on RA while ambulating, quickly recovers to mid 90s with just a few seconds of standing rest break.  RN notified.  Pt denied chest pain.  HR in 90s with activity.  Pt reports his wife works full-time and will not be able to drive him to PT appointments so recommendation has been updated to [place], [place] notified.  Pt also now reporting that his walker at home does not have wheels. Regular RW will place pt at increased risk of falls given pt's body habitus and thus recommending Bari RW.   Follow Up Recommendations  Home health PT     Equipment Recommendations  Other (comment)(Bari RW)    Recommendations for Other Services       Precautions / Restrictions Precautions Precautions: Fall;Other (comment) Precaution Comments: direct anterior, no hip precautions Restrictions Weight Bearing Restrictions: Yes RLE Weight Bearing: Partial weight bearing RLE Partial Weight Bearing Percentage or Pounds: 50    Mobility  Bed Mobility Overal bed mobility: Needs Assistance Bed Mobility: Sit to Supine       Sit to supine: Min guard   General bed mobility comments: Increased effort and time and pt has difficulty elevating RLE into bed but is able to do so without outside physical assist.    Transfers Overall transfer level: Needs assistance Equipment used: (Bari RW) Transfers: Sit to/from Stand Sit to Stand: Min guard         General transfer comment: Pt stood very quickly from chair leading to unsteadiness but no LOB, cues for improved safety and speed.  Cues for  hand placement.   Ambulation/Gait Ambulation/Gait assistance: Min guard Gait Distance (Feet): 160 Feet Assistive device: (Bari RW) Gait Pattern/deviations: Decreased step length - left;Decreased weight shift to right;Antalgic;Step-through pattern;Step-to pattern Gait velocity: decreased   General Gait Details: Pt initially demonstrating step to gait pattern but advances to step through naturally without verbal cues.  Cues required for proper RW positioning and for upright posture with forward gaze.  Cues and demonstration for improved R knee F to allow for foot clearance with RLE as pt initially shuffling RLE. SpO2 down as low as 89% with SOB on RA while ambulating, quickly recovers to mid 90s with just a few seconds of standing rest break.  RN notified.  Pt denied chest pain.  HR in 90s with activity.     Stairs             Wheelchair Mobility    Modified Rankin (Stroke Patients Only)       Balance Overall balance assessment: Needs assistance Sitting-balance support: No upper extremity supported;Feet supported Sitting balance-Leahy Scale: Good Sitting balance - Comments: Pt steady reaching within BOS.    Standing balance support: Bilateral upper extremity supported;During functional activity Standing balance-Leahy Scale: Poor Standing balance comment: Pt relies on BUE support for static and dynamic activities                            Cognition Arousal/Alertness: Awake/alert  Behavior During Therapy: WFL for tasks assessed/performed Overall Cognitive Status: Within Functional Limits for tasks assessed                                 General Comments: Pt noted to close eyes if OT was not directly in line of sight. Pt able to engage in conversation and participate in OT session, however activities were limited 2/2 pt report of fatigue.       Exercises Total Joint Exercises Ankle Circles/Pumps: AROM;Both;10 reps;Seated Quad Sets:  Strengthening;Both;10 reps;Supine Long Arc Quad: Strengthening;Right;10 reps;Seated Other Exercises Other Exercises: Pt instructed in safe use of AE for LB dressing and bathing, compression stocking mgmt, and functional use of RW for self-care/grooming in order to increase safety within the home and decrease risk of falls.    General Comments General comments (skin integrity, edema, etc.): BP taken in sitting at start of session which was stable.       Pertinent Vitals/Pain Pain Assessment: Faces Pain Score: 9  Faces Pain Scale: Hurts whole lot Pain Location: R hip Pain Descriptors / Indicators: Grimacing;Guarding;Moaning Pain Intervention(s): Limited activity within patient's tolerance;Monitored during session;Patient requesting pain meds-RN notified;Repositioned;Utilized relaxation techniques    Home Living Family/patient expects to be discharged to:: Private residence Living Arrangements: Spouse/significant other Available Help at Discharge: Family;Available 24 hours/day Type of Home: House(Townhouse) Home Access: Stairs to enter Entrance Stairs-Rails: Left;Right;Can reach both Home Layout: One level Home Equipment: Grab bars - tub/shower;Shower seat - built in;Hand held shower head(Pt endorses ability to use bari RW in bathroom.)      Prior Function Level of Independence: Independent with assistive device(s);Independent      Comments: Pt reports he is able to ambulate independently, however, he was utlizing a SPC about 50% of the time 2/2 R hip pain. No falls in the past 6 months. Independent with ADL. Wife does IADLs (cooking, cleaning, etc.) at baseline.    PT Goals (current goals can now be found in the care plan section) Acute Rehab PT Goals Patient Stated Goal: to be as independent as possible PT Goal Formulation: With patient Time For Goal Achievement: 01/16/19 Potential to Achieve Goals: Good Progress towards PT goals: Progressing toward goals    Frequency     BID      PT Plan Discharge plan needs to be updated;Equipment recommendations need to be updated    Co-evaluation              AM-PAC PT "6 Clicks" Mobility   Outcome Measure  Help needed turning from your back to your side while in a flat bed without using bedrails?: A Little Help needed moving from lying on your back to sitting on the side of a flat bed without using bedrails?: A Little Help needed moving to and from a bed to a chair (including a wheelchair)?: A Little Help needed standing up from a chair using your arms (e.g., wheelchair or bedside chair)?: A Little Help needed to walk in hospital room?: A Little Help needed climbing 3-5 steps with a railing? : A Lot 6 Click Score: 17    End of Session Equipment Utilized During Treatment: Gait belt Activity Tolerance: Patient tolerated treatment well Patient left: with call bell/phone within reach;with SCD's reapplied;in bed;with bed alarm set Nurse Communication: Mobility status;Patient requests pain meds;Other (comment)(SpO2, HR) PT Visit Diagnosis: Pain;Unsteadiness on feet (R26.81);Other abnormalities of gait and mobility (R26.89);Muscle weakness (generalized) (M62.81) Pain - Right/Left:  Right Pain - part of body: Hip     Time: 0935-1005 PT Time Calculation (min) (ACUTE ONLY): 30 min  Charges:  $Gait Training: 23-37 mins                     Collie Siad PT, DPT 01/03/2019, 1:37 PM

## 2019-01-03 NOTE — Progress Notes (Signed)
PHARMACIST - PHYSICIAN COMMUNICATION  CONCERNING:  Enoxaparin (Lovenox) for DVT Prophylaxis    RECOMMENDATION: Patient was prescribed enoxaprin 40mg  q24 hours for VTE prophylaxis.   Filed Weights   01/02/19 0723  Weight: 290 lb (131.5 kg)    Body mass index is 42.83 kg/m.  Estimated Creatinine Clearance: 105 mL/min (by C-G formula based on SCr of 0.88 mg/dL).   Based on [place] patient is candidate for enoxaparin 40mg  every 12 hour dosing due to BMI being >40.    DESCRIPTION: Pharmacy has adjusted enoxaparin dose per [facility] policy.  Patient is now receiving enoxaparin 40mg  every 12 hours.    Lu Duffel, PharmD, BCPS Clinical Pharmacist 01/03/2019 11:38 AM

## 2019-01-03 NOTE — Evaluation (Signed)
Occupational Therapy Evaluation Patient Details Name: Adrian [name] MRN: [id number] DOB: 05-24-1946 Today's Date: 01/03/2019    History of Present Illness Pt is a 73 y/o M s/p R THA.  Pt's PMH includes lumbar radiculopathy, neuropathy, PE.    Clinical Impression   Pt seen for OT evaluation this date, POD#1 from above surgery. Pt was independent in all ADLs prior to surgery, however occasionally using SPC for mobility due to R hip pain. Pt is eager to return to PLOF with less pain and improved safety and independence. Pt currently requires minimal assist for LB dressing and bathing while in seated position due to pain and limited AROM of R hip. Pt instructed in self care skills, falls prevention strategies, home/routines modifications, DME/AE for LB bathing and dressing tasks, and compression stocking mgt strategies. Pt would benefit from additional instruction in self care skills and techniques to help maintain precautions with or without assistive devices to support recall and carryover prior to discharge. Recommend HHOT upon discharge.      Follow Up Recommendations  Home health OT    Equipment Recommendations  (TBD at next venue of care. )    Recommendations for Other Services       Precautions / Restrictions Precautions Precautions: Fall;Other (comment) Precaution Comments: direct anterior, no hip precautions Restrictions Weight Bearing Restrictions: Yes RLE Weight Bearing: Partial weight bearing RLE Partial Weight Bearing Percentage or Pounds: 50      Mobility Bed Mobility               General bed mobility comments: Not formally assessed. Pt seated upright in recliner at beginning/end of OT session. Mobility limited by 9/10 pain and cognitive status on this date.   Transfers                 General transfer comment: Not formally assessed. Pt seated upright in recliner at beginning/end of OT session. Mobility limited by 9/10 pain on this date.     Balance  Overall balance assessment: Needs assistance Sitting-balance support: No upper extremity supported;Feet supported Sitting balance-Leahy Scale: Good Sitting balance - Comments: Pt steady reaching within BOS.                                    ADL either performed or assessed with clinical judgement   ADL Overall ADL's : Needs assistance/impaired Eating/Feeding: Independent   Grooming: Modified independent;With adaptive equipment   Upper Body Bathing: Sitting;Modified independent;With adaptive equipment Upper Body Bathing Details (indicate cue type and reason): Pt should adhere to post surgical bathing instructions. Overall level of function indicates pt could complete UB bathing with AE.  Lower Body Bathing: Minimal assistance;With adaptive equipment;Sit to/from stand;Cueing for safety   Upper Body Dressing : Independent   Lower Body Dressing: Minimal assistance;With adaptive equipment;Cueing for safety;Sit to/from stand   Toilet Transfer: Min guard;Cueing for [employer] Details (indicate cue type and reason): Pt PWB status indicates need for BSC and stand-pivot at this time. Pt min guard during PT session for Stand pivot to recliner.  Toileting- Water quality scientist and Hygiene: Sit to/from stand;With adaptive equipment;Cueing for safety;Min guard   Tub/ Shower Transfer: Tub bench;Ambulation;Min guard;Cueing for safety   Functional mobility during ADLs: Min guard;Cueing for safety;Rolling walker General ADL Comments: Pt requiring VC for adhering to [facility] precautions while completing ADL/IADLs.      Vision Baseline Vision/History: Wears glasses Wears Glasses: Distance only Patient  Visual Report: No change from baseline       Perception     Praxis      Pertinent Vitals/Pain Pain Assessment: 0-10 Pain Score: 9  Pain Location: R hip Pain Descriptors / Indicators: Sore;Grimacing;Guarding Pain Intervention(s): Limited activity within  patient's tolerance;Monitored during session;Premedicated before session;Patient requesting pain meds-RN notified     Hand Dominance     Extremity/Trunk Assessment Upper Extremity Assessment Upper Extremity Assessment: Overall WFL for tasks assessed   Lower Extremity Assessment Lower Extremity Assessment: Defer to PT evaluation RLE Deficits / Details: Unable to assess 2/2 pain.    Cervical / Trunk Assessment Cervical / Trunk Assessment: Normal   Communication Communication Communication: No difficulties   Cognition Arousal/Alertness: Suspect due to medications;Lethargic Behavior During Therapy: Flat affect;WFL for tasks assessed/performed Overall Cognitive Status: Within Functional Limits for tasks assessed                                 General Comments: Pt noted to close eyes if OT was not directly in line of sight. Pt able to engage in conversation and participate in OT session, however activities were limited 2/2 pt report of fatigue.    General Comments       Exercises Other Exercises Other Exercises: Pt instructed in safe use of AE for LB dressing and bathing, compression stocking mgmt, and functional use of RW for self-care/grooming in order to increase safety within the home and decrease risk of falls.   Shoulder Instructions      Home Living Family/patient expects to be discharged to:: Private residence Living Arrangements: Spouse/significant other Available Help at Discharge: Family;Available 24 hours/day Type of Home: House(Townhouse) Home Access: Stairs to enter Entrance Stairs-Number of Steps: 5 Entrance Stairs-Rails: Left;Right;Can reach both Home Layout: One level     Bathroom Shower/Tub: Teacher, early years/pre: Handicapped height Bathroom Accessibility: Yes   Home Equipment: Grab bars - tub/shower;Shower seat - built in;Hand held shower head(Pt endorses ability to use bari RW in bathroom.)          Prior  Functioning/Environment Level of Independence: Independent with assistive device(s);Independent        Comments: Pt reports he is able to ambulate independently, however, he was utlizing a SPC about 50% of the time 2/2 R hip pain. No falls in the past 6 months. Independent with ADL. Wife does IADLs (cooking, cleaning, etc.) at baseline.         OT Problem List: Decreased strength;Decreased range of motion;Decreased activity tolerance;Impaired balance (sitting and/or standing);Decreased coordination;Decreased knowledge of use of DME or AE;Decreased knowledge of precautions;Pain      OT Treatment/Interventions: Self-care/ADL training;Therapeutic exercise;Therapeutic activities;DME and/or AE instruction;Patient/family education;Balance training    OT Goals(Current goals can be found in the care plan section) Acute Rehab OT Goals Patient Stated Goal: to be as independent as possible OT Goal Formulation: With patient Time For Goal Achievement: 01/17/19 Potential to Achieve Goals: Good ADL Goals Pt Will Perform Lower Body Bathing: with modified independence;with adaptive equipment;sit to/from stand(With LRAD for safety and adherence to PWB status.) Pt Will Perform Lower Body Dressing: with modified independence;sit to/from stand;with adaptive equipment(With LRAD for safety and adherence to PWB status.) Additional ADL Goal #1: Pt will independently instruct a caregiver on compression stocking mgmt strategies including donning/doffing and wear instructions.  OT Frequency: Min 1X/week   Barriers to D/C:  Co-evaluation              AM-PAC OT "6 Clicks" Daily Activity     Outcome Measure Help from another person eating meals?: None Help from another person taking care of personal grooming?: A Little Help from another person toileting, which includes using toliet, bedpan, or urinal?: A Little Help from another person bathing (including washing, rinsing, drying)?: A  Little Help from another person to put on and taking off regular upper body clothing?: None Help from another person to put on and taking off regular lower body clothing?: A Little 6 Click Score: 20   End of Session Nurse Communication: Patient requests pain meds  Activity Tolerance: Patient limited by pain;Patient limited by fatigue Patient left: in chair;with call bell/phone within reach;with chair alarm set;with SCD's reapplied  OT Visit Diagnosis: Unsteadiness on feet (R26.81);Other abnormalities of gait and mobility (R26.89);Pain Pain - Right/Left: Right Pain - part of body: Hip                Time: 3570-1779 OT Time Calculation (min): 24 min Charges:  OT General Charges $OT Visit: 1 Visit OT Evaluation $OT Eval Low Complexity: 1 Low OT Treatments $Self Care/Home Management : 8-22 mins  Fabian Walder, OTR/L 01/03/19, 11:38 AM

## 2019-01-03 NOTE — Progress Notes (Signed)
Physical Therapy Treatment Patient Details Name: Adrian [name] MRN: [id number] DOB: [birth year] Today's Date: 01/03/2019    History of Present Illness Pt is a 73 y/o M s/p R THA.  Pt's PMH includes lumbar radiculopathy, neuropathy, PE.     PT Comments    Pt did not progress toward mobility goals this session due to SpO2 levels. Pt struggled to keep his eyes open throughout the session except while conversing with the PT. Pt required min assist for bed mobility, min +2 assist and cues for safety with sit<>stand. Once in standing patient ambulated to bathroom when SpO2 dropped to 83%. Pt instructed to sit and wait for levels to rise. Pt sat ~2 minutes before SpO2 improved to 90%.  Once SpO2 improved to mid 90s pt stood and ambulated 38ft. Pt quickly became SOB again and was instructed to return to sitting. Throughout session while ambulating pt was SOB despite cuing to focus on deep breaths and instruction for pursed lip breathing. HR ranging 70s-90s while ambulating. Once in sitting his vitals returned to baseline with SpO2 of 92%, BP 142/76. Following session RN and MD were notified of SpO2, HR, BP, and drowsy presentation.    Follow Up Recommendations  Home health PT     Equipment Recommendations  Other (comment)(bariatric RW)    Recommendations for Other Services       Precautions / Restrictions Precautions Precautions: Fall Precaution Comments: direct anterior, no hip precautions Restrictions Weight Bearing Restrictions: Yes RLE Weight Bearing: Partial weight bearing RLE Partial Weight Bearing Percentage or Pounds: 50    Mobility  Bed Mobility Overal bed mobility: Needs Assistance Bed Mobility: Supine to Sit     Supine to sit: Min assist;HOB elevated Sit to supine: Min guard   General bed mobility comments: pt required PT to guide RLE when going from supine to sit on EOB   Transfers Overall transfer level: Needs assistance Equipment used: (bariatric RW ) Transfers: Sit  to/from Stand Sit to Stand: Min assist;+2 safety/equipment;+2 physical assistance         General transfer comment: Pt reminded via VC to scoot closer to the EOB to ease the transition from sit to stand. Pt reminded of hand placement for transfer - instructed to not have hands on front of walker but to place one on the hand rest. Poorly controlled descent to sit despite cues.   Ambulation/Gait Ambulation/Gait assistance: Min guard Gait Distance (Feet): 20 Feet Assistive device: (bariatric RW) Gait Pattern/deviations: Decreased step length - left;Decreased stance time - right;Decreased stride length;Trunk flexed;Antalgic Gait velocity: decreased   General Gait Details: Pt on RA throughout session.  Pt struggled to keep eyes open and upright posture despite VC from PT. SpO2 as low as 83% while ambulating from the bed to the bathroom with SOB.  Pt was instructed to sit and required ~2 minutes to recover SpO2 to 90%. Once SpO2 recovered to low-mid 90s pt stood and ambulated 4 ft and attempted to use urinal. Pt SOB and was instructed to back up to chair.  SpO2 reading 93% once sitting.  HR ranging from 70-90 while ambulating.  RN and MD notified after session of SpO2, HR, and drowsy presentation. Pt denies any chest pain.    Stairs             Wheelchair Mobility    Modified Rankin (Stroke Patients Only)       Balance Overall balance assessment: Needs assistance Sitting-balance support: No upper extremity supported;Feet supported Sitting balance-Leahy Scale: Good  Standing balance support: Bilateral upper extremity supported;During functional activity Standing balance-Leahy Scale: Poor Standing balance comment: Pt relies on BUE support for static and dynamic activities                            Cognition Arousal/Alertness: Awake/alert(somewhat drowsy) Behavior During Therapy: WFL for tasks assessed/performed Overall Cognitive Status: Within Functional Limits  for tasks assessed                                        Exercises Total Joint Exercises Ankle Circles/Pumps: AROM;Both;10 reps;Seated Quad Sets: Strengthening;Both;10 reps;Supine Long Arc Quad: Strengthening;Right;10 reps;Seated Other Exercises Other Exercises: Instructed pt in pursed lip breathing with VCs and demonstration.     General Comments General comments (skin integrity, edema, etc.): HR and SpO2 monitored throughout session (see gait section above).  SpO2 92% at end of session in sitting on RA.  BP taken at end of session 142/67.  Pt with poor understanding of importance of good reading on SpO2 and education provided.       Pertinent Vitals/Pain Pain Assessment: Faces Faces Pain Scale: Hurts whole lot Pain Location: R hip Pain Descriptors / Indicators: Grimacing;Guarding;Moaning Pain Intervention(s): Limited activity within patient's tolerance;Monitored during session;Relaxation(breathing techniques)    Home Living                      Prior Function            PT Goals (current goals can now be found in the care plan section) Acute Rehab PT Goals Patient Stated Goal: to be as independent as possible PT Goal Formulation: With patient Time For Goal Achievement: 01/16/19 Potential to Achieve Goals: Good Progress towards PT goals: Not progressing toward goals - comment(due to SpO2)    Frequency    BID      PT Plan Current plan remains appropriate    Co-evaluation              AM-PAC PT "6 Clicks" Mobility   Outcome Measure  Help needed turning from your back to your side while in a flat bed without using bedrails?: A Lot Help needed moving from lying on your back to sitting on the side of a flat bed without using bedrails?: A Lot Help needed moving to and from a bed to a chair (including a wheelchair)?: A Lot Help needed standing up from a chair using your arms (e.g., wheelchair or bedside chair)?: A Lot Help needed to  walk in hospital room?: A Little Help needed climbing 3-5 steps with a railing? : A Lot 6 Click Score: 13    End of Session Equipment Utilized During Treatment: Gait belt Activity Tolerance: Other (comment)(limited due to SpO2 levels) Patient left: in chair;with call bell/phone within reach;with chair alarm set;with family/visitor present;with SCD's reapplied Nurse Communication: Other (comment);Mobility status(SpO2, HR, BP) PT Visit Diagnosis: Pain;Unsteadiness on feet (R26.81);Other abnormalities of gait and mobility (R26.89);Muscle weakness (generalized) (M62.81) Pain - Right/Left: Right Pain - part of body: Hip     Time: 4098-1191 PT Time Calculation (min) (ACUTE ONLY): 39 min  Charges:  $Gait Training: 23-37 mins $Therapeutic Activity: 8-22 mins                     Dorothy Spark, SPT   01/03/2019, 4:13 PM

## 2019-01-03 NOTE — Progress Notes (Signed)
Clinical Social Worker (CSW) received SNF consult. PT is recommending home health. RN case manager aware of above. Please reconsult if future social work needs arise. CSW signing off.   Shadrick Senne, LCSW [phone] 

## 2019-01-03 NOTE — Care Management Note (Signed)
Case Management Note  Patient Details  Name: Adrian [name] MRN: [id number] Date of Birth: [birth year]  Subjective/Objective:                   Meet with patient to discuss DC plan Patient lives with wife Patient drives when he is allowed Patient has back up transportation when needed Patient uses [facility] [place] as Pharmacy Can afford medication   RN CM will call and check price of Lovenox before Patient discharges Encouraged patient to use IS to avoid PNA Patient uses Dr. Trey Paula as PCP Patient needs a rolling walker, denies the need for a BSC at this time Action/Plan:  [facility] list provided to the patient per CMS.gov, will choose [facility] agency tomorrow before going home Notified [facility] about the need for the rolling walker  Expected Discharge Date:                  Expected Discharge Plan:  [place]  In-House Referral:     Discharge planning Services  CM Consult  Post Acute Care Choice:  Durable Medical Equipment Choice offered to:  Patient  DME Arranged:  Walker rolling DME Agency:  [place]:  PT [facility] Agency:     Status of Service:  In process, will continue to follow  If discussed at Long Length of Stay Meetings, dates discussed:    Additional Comments:  Su Hilt, RN 01/03/2019, 3:15 PM

## 2019-01-03 NOTE — Anesthesia Postprocedure Evaluation (Signed)
Anesthesia Post Note  Patient: Adrian [name]  Procedure(s) Performed: TOTAL HIP ARTHROPLASTY ANTERIOR APPROACH (Right Hip)  Patient location during evaluation: Nursing Unit Anesthesia Type: Spinal Level of consciousness: awake, awake and alert and oriented Pain management: pain level controlled Vital Signs Assessment: post-procedure vital signs reviewed and stable Respiratory status: spontaneous breathing, nonlabored ventilation and respiratory function stable Cardiovascular status: stable Postop Assessment: patient able to bend at knees Anesthetic complications: no     Last Vitals:  Vitals:   01/02/19 2351 01/03/19 0414  BP: (!) 115/53 134/70  Pulse: 68 78  Resp: 18 19  Temp: 37.3 C 37.1 C  SpO2: 93% 95%    Last Pain:  Vitals:   01/03/19 0414  TempSrc: Oral  PainSc:                  [name]

## 2019-01-03 NOTE — Progress Notes (Signed)
   Subjective: 1 Day Post-Op Procedure(s) (LRB): TOTAL HIP ARTHROPLASTY ANTERIOR APPROACH (Right) Patient reports pain as 4 on 0-10 scale.   Patient is well, and has had no acute complaints or problems Denies any CP, SOB, ABD pain. We will continue therapy today.  Plan is to go Home after hospital stay.  Objective: Vital signs in last 24 hours: Temp:  [97.4 F (36.3 C)-99.2 F (37.3 C)] 98.6 F (37 C) (01/15 0754) Pulse Rate:  [55-78] 72 (01/15 0754) Resp:  [11-20] 20 (01/15 0754) BP: (95-152)/(50-87) 125/71 (01/15 0754) SpO2:  [93 %-100 %] 96 % (01/15 0754)  Intake/Output from previous day: 01/14 0701 - 01/15 0700 In: 1918.7 [P.O.:385; I.V.:1433.7; IV Piggyback:100] Out: 1550 [Urine:1350; Blood:200] Intake/Output this shift: No intake/output data recorded.  Recent Labs    01/01/19 1207 01/03/19 0416  HGB 13.0 10.0*   Recent Labs    01/01/19 1207 01/03/19 0416  WBC 10.6* 8.7  RBC 4.42 3.41*  HCT 39.7 30.6*  PLT 271 209   Recent Labs    01/01/19 1207 01/03/19 0416  NA 139 133*  K 3.8 3.5  CL 104 101  CO2 25 25  BUN 10 10  CREATININE 0.98 0.88  GLUCOSE 93 113*  CALCIUM 9.2 8.1*   Recent Labs    01/02/19 0739 01/03/19 0416  INR 1.10 1.18    EXAM General - Patient is Alert, Appropriate and Oriented Extremity - Neurovascular intact Sensation intact distally Intact pulses distally Dorsiflexion/Plantar flexion intact No cellulitis present Compartment soft Dressing - dressing C/D/I and no drainage, prevena intact Motor Function - intact, moving foot and toes well on exam.   Past Medical History:  Diagnosis Date  . Anxiety   . Arthritis    rheumatoid  . Basal cell carcinoma of skin    skin  . Colon polyps   . Depression   . GERD (gastroesophageal reflux disease)   . Gout   . Hemorrhoids   . Hypertension   . Lumbar radiculopathy 09/25/2014  . Neuropathy   . Osteoarthritis   . Pneumonia   . Pulmonary embolism ([place]) 05/25/2014   Overview:   Times 2 on anticoagulation a.  Times two.   b.  Filter placed.    Last Assessment & Plan:  Times 2 on anticoagulation a.  Times two.   b.  Filter placed. No current bleeding noted.    . Reflux   . Sleep apnea   . Umbilical hernia     Assessment/Plan:   1 Day Post-Op Procedure(s) (LRB): TOTAL HIP ARTHROPLASTY ANTERIOR APPROACH (Right) Active Problems:   Status post total hip replacement, right  Estimated body mass index is 42.83 kg/m as calculated from the following:   Height as of this encounter: 5\' 9"  (1.753 m).   Weight as of this encounter: 131.5 kg. Advance diet Up with therapy  Needs BM Labs and VSS Recheck labs in the am CM to assist with discharge  DVT Prophylaxis - Coumadin, Foot Pumps and TED hose Weight-Bearing as tolerated to left leg   T. Rachelle Hora, PA-C [place] 01/03/2019, 8:11 AM

## 2019-01-04 LAB — CBC
HEMATOCRIT: 31.9 % — AB (ref 39.0–52.0)
HEMOGLOBIN: 10.4 g/dL — AB (ref 13.0–17.0)
MCH: 29.9 pg (ref 26.0–34.0)
MCHC: 32.6 g/dL (ref 30.0–36.0)
MCV: 91.7 fL (ref 80.0–100.0)
Platelets: 244 10*3/uL (ref 150–400)
RBC: 3.48 MIL/uL — ABNORMAL LOW (ref 4.22–5.81)
RDW: 15.1 % (ref 11.5–15.5)
WBC: 11.7 10*3/uL — ABNORMAL HIGH (ref 4.0–10.5)
nRBC: 0 % (ref 0.0–0.2)

## 2019-01-04 LAB — PROTIME-INR
INR: 1.23
Prothrombin Time: 15.4 seconds — ABNORMAL HIGH (ref 11.4–15.2)

## 2019-01-04 NOTE — Progress Notes (Signed)
Physical Therapy Treatment Patient Details Name: Adrian [name] MRN: [id number] DOB: [birth year] Today's Date: 01/04/2019    History of Present Illness Pt is a 73 y/o M s/p R THA.  Pt's PMH includes lumbar radiculopathy, neuropathy, PE.     PT Comments    [name] is progressing towards his mobility goals but was limited at end of session due to dizziness. Pt walked a total of ~160 feet and negotiated ascending and descending 4 steps this session. While ambulating pt required cues for breathing techniques and RW management with one 30 second break ~half way. Pt denied chest pain or dizziness with ambulation. Once in PT gym pt took a 2 minute seated rest break prior to negotiating stairs. At top of stairs pt mentioned a 4/10 dizziness. After a minute rest break pt reported no dizziness and descended the stairs. SpO2 dropped as low as 89% and HR got up to 110 with stair negotiation, RN was notified of values and dizziness at the end of the session. Pt demonstrated poor safety awareness with stand>pivot to chair, educated on utilizing RW and not removing hands. No further concerns regarding SpO2 or HR at end of session with pt in chair.    Follow Up Recommendations  Home health PT     Equipment Recommendations  (bariatric RW )    Recommendations for Other Services       Precautions / Restrictions Precautions Precautions: Fall Precaution Comments: direct anterior, no hip precautions Restrictions Weight Bearing Restrictions: Yes RLE Weight Bearing: Partial weight bearing RLE Partial Weight Bearing Percentage or Pounds: 50    Mobility  Bed Mobility Overal bed mobility: Needs Assistance Bed Mobility: Supine to Sit     Supine to sit: Min guard;HOB elevated     General bed mobility comments: Pt did not require physical assist but did struggle getting RLE to EOB and PT provided min guard for safety.  Transfers Overall transfer level: Needs assistance Equipment used: (bariatric RW  ) Transfers: Sit to/from [facility] Sit to Stand: Min guard;+2 safety/equipment Stand pivot transfers: +2 safety/equipment;Min guard       General transfer comment: Cues required for pt to scoot towards EOB for easier sit<>stand. Pt twisting his trunk to the left when going from sit<>stand, educated that scooting forward will make it easier and cause less twisting. Pt reminded to utilize walker when moving from a stand pivot to the chair.  Ambulation/Gait Ambulation/Gait assistance: Min guard Gait Distance (Feet): 160 Feet Assistive device: (bariatric RW ) Gait Pattern/deviations: Decreased stride length;Trunk flexed Gait velocity: decreased   General Gait Details: Patient on RA throughout session. Pt ambulated a total of ~160 feet from his room to the PT gym requiring only one 30 second rest break ~half way. While ambulating pt denied any dizziness/chest pain, SpO2 did not drop below 91%, and HR did not exceed 108. Once in PT gym pt took a seated rest break for 2 minutes.   Stairs Stairs: Yes Stairs assistance: Min guard;+2 safety/equipment Stair Management: Two rails;Step to pattern;Forwards Number of Stairs: 4 General stair comments: Pt ascended and descended stairs one time. At the top of the stairs pt mentioned he was dizzy at a 4/10. Pt rested at top of the stairs for 1 minutes then descended. SpO2 dropped as low as 89% and HR up to 110 while negotiating stairs. Pt reported no dizziness after stair negotiation and was instructed to sit in chair. After session RN was notified of dizziness.    Wheelchair  Mobility    Modified Rankin (Stroke Patients Only)       Balance Overall balance assessment: Needs assistance Sitting-balance support: No upper extremity supported;Feet supported Sitting balance-Leahy Scale: Good     Standing balance support: Bilateral upper extremity supported;During functional activity Standing balance-Leahy Scale: Fair Standing  balance comment: Pt relies on BUE support for static and dynamic activities                            Cognition Arousal/Alertness: Awake/alert Behavior During Therapy: WFL for tasks assessed/performed(decrease in safety awareness towards end of session ) Overall Cognitive Status: Within Functional Limits for tasks assessed                                 General Comments: Pt demonstated safety concerns after descending stairs by taking hands off the walker and initiating a pivot into chair without walker.      Exercises      General Comments General comments (skin integrity, edema, etc.): HR and SpO2 monitored throughout session (see gait section above). SpO2 94% at end of session sitting in chair on RA.       Pertinent Vitals/Pain Pain Assessment: 0-10 Pain Score: 6 (6 when ambulating/stair negotiation, 2 at rest) Pain Location: R hip Pain Descriptors / Indicators: Grimacing;Guarding;Sore Pain Intervention(s): Monitored during session;Other (comment);Premedicated before session(pursed lip breathing)    Home Living                      Prior Function            PT Goals (current goals can now be found in the care plan section) Acute Rehab PT Goals Patient Stated Goal: to be as independent as possible PT Goal Formulation: With patient Time For Goal Achievement: 01/16/19 Potential to Achieve Goals: Good Progress towards PT goals: Progressing toward goals    Frequency    BID      PT Plan Current plan remains appropriate    Co-evaluation              AM-PAC PT "6 Clicks" Mobility   Outcome Measure  Help needed turning from your back to your side while in a flat bed without using bedrails?: A Little Help needed moving from lying on your back to sitting on the side of a flat bed without using bedrails?: A Little Help needed moving to and from a bed to a chair (including a wheelchair)?: A Little Help needed standing up from a  chair using your arms (e.g., wheelchair or bedside chair)?: A Little Help needed to walk in hospital room?: A Little Help needed climbing 3-5 steps with a railing? : A Little 6 Click Score: 18    End of Session Equipment Utilized During Treatment: Gait belt Activity Tolerance: Treatment limited secondary to medical complications (Comment)(limited by dizziness ) Patient left: in chair;with SCD's reapplied;with family/visitor present;with chair alarm set;with call bell/phone within reach Nurse Communication: Mobility status;Other (comment)(dizziness, SpO2, HR) PT Visit Diagnosis: Pain;Unsteadiness on feet (R26.81);Other abnormalities of gait and mobility (R26.89);Muscle weakness (generalized) (M62.81) Pain - Right/Left: Right Pain - part of body: Hip     Time: 7371-0626 PT Time Calculation (min) (ACUTE ONLY): 48 min  Charges:  $Gait Training: 23-37 mins $Therapeutic Activity: 8-22 mins  Dorothy Spark, SPT   01/04/2019, 2:07 PM

## 2019-01-04 NOTE — Progress Notes (Signed)
Physical Therapy Treatment Patient Details Name: Adrian [name] MRN: [id number] DOB: 02/17/1946 Today's Date: 01/04/2019    History of Present Illness Pt is a 73 y/o M s/p R THA.  Pt's PMH includes lumbar radiculopathy, neuropathy, PE.     PT Comments    Adrian [name] continues to progress towards his goal of mobility. However, pt is easily fatigued and lacks R knee flexion during ambulation. Throughout the treatment session he walked 100 ft with intermittent rest breaks.  During gait training SpO2 did not drop below 91% and HR did not rise above 110. During a stepping forward/backward exercise with bariatric RW his SpO2 did get as low as 88% and a rest break was instructed. Following the break his SpO2 rose to the 90's. PT educated pt of the importance of rest breaks while ambulating at home and ceasing when he is fatigued due to the increased fall risk. Pt denies any dizziness or chest pain throughout session. Pt will continue to benefit from PT in order to ambulate community distances and safe stair negotiation.    Follow Up Recommendations  Home health PT     Equipment Recommendations  (bariatric RW )    Recommendations for Other Services       Precautions / Restrictions Precautions Precautions: Fall Precaution Comments: direct anterior, no hip precautions Restrictions Weight Bearing Restrictions: Yes RLE Weight Bearing: Partial weight bearing RLE Partial Weight Bearing Percentage or Pounds: 50    Mobility  Bed Mobility Overal bed mobility: Needs Assistance Bed Mobility: Supine to Sit     Supine to sit: Min guard;HOB elevated     General bed mobility comments: Pt was able to go from supine to EOB without using bed rail. PT provided min guard for safety.   Transfers Overall transfer level: Needs assistance Equipment used: (bariatric RW ) Transfers: Sit to/from Stand Sit to Stand: Min guard;+2 safety/equipment;From elevated surface(elevated bed to simulate bed at home. )          General transfer comment: pt demonstrated less lateral lean to L when going from sit<>stand. pt demonstrated a more controlled descent to sitting but still lacks full control.   Ambulation/Gait Ambulation/Gait assistance: Min guard Gait Distance (Feet): 100 Feet Assistive device: (bariatric RW) Gait Pattern/deviations: Step-through pattern(decreased R knee flexion and heel strike.) Gait velocity: decreased   General Gait Details: Pt on RA throughout session. Pt ambulated 100 ft with intermittent rest breaks, possibly due to burning pain in anterior portion of R thigh. During one rest break pt performed static R knee bend which sporadically improved knee flexion during gait. Per PT instruction pt sat in chair and was educated that once he begins to fatigue it isnt safe to continue ambulating. SpO2 and HR was monitored throughout gait and SpO2 didnt drop below 91% and HR didnt go above 110. Pt reported no dizziness or chest pain.    Stairs             Wheelchair Mobility    Modified Rankin (Stroke Patients Only)       Balance Overall balance assessment: Needs assistance Sitting-balance support: No upper extremity supported;Feet unsupported Sitting balance-Leahy Scale: Good     Standing balance support: Bilateral upper extremity supported;During functional activity Standing balance-Leahy Scale: Fair Standing balance comment: Pt relies on BUE support for static and dynamic activities                            Cognition Arousal/Alertness:  Awake/alert Behavior During Therapy: WFL for tasks assessed/performed Overall Cognitive Status: Within Functional Limits for tasks assessed                                        Exercises Total Joint Exercises Knee Flexion: AROM;Right;5 reps;Standing;Other (comment)(pt reported this helped stretch the ant portion of thigh ) Other Exercises Other Exercises: Instructed pt in sit<>stands from elevated  bed x5 with a focus on controlled rise/descent with less L lateral twist via VCs.  Other Exercises: Instructed pt in forward/backward steps with a focus on RW placement and R knee flexion x6 each way. SpO2 got as low as 88% in which pt was instructed to take a rest break. HR did not rise above 106. SpO2 rose back to the 90's with 30 second rest break.     General Comments General comments (skin integrity, edema, etc.): SpO2 and HR monitored throughout session (see gait section above).       Pertinent Vitals/Pain Pain Assessment: 0-10 Pain Score: 5 (with ambulation, 2 at rest ) Pain Location: R hip Pain Descriptors / Indicators: Grimacing;Burning(burning anterior thigh) Pain Intervention(s): Monitored during session;Premedicated before session    Home Living                      Prior Function            PT Goals (current goals can now be found in the care plan section) Acute Rehab PT Goals Patient Stated Goal: to be as independent as possible PT Goal Formulation: With patient Time For Goal Achievement: 01/16/19 Potential to Achieve Goals: Good Progress towards PT goals: Progressing toward goals    Frequency    BID      PT Plan Current plan remains appropriate    Co-evaluation              AM-PAC PT "6 Clicks" Mobility   Outcome Measure  Help needed turning from your back to your side while in a flat bed without using bedrails?: A Little Help needed moving from lying on your back to sitting on the side of a flat bed without using bedrails?: A Little Help needed moving to and from a bed to a chair (including a wheelchair)?: A Little Help needed standing up from a chair using your arms (e.g., wheelchair or bedside chair)?: A Little Help needed to walk in hospital room?: A Little Help needed climbing 3-5 steps with a railing? : A Little 6 Click Score: 18    End of Session Equipment Utilized During Treatment: Gait belt Activity Tolerance: Patient limited  by fatigue Patient left: in chair;with call bell/phone within reach;with chair alarm set;with family/visitor present;with SCD's reapplied Nurse Communication: Mobility status PT Visit Diagnosis: Pain;Unsteadiness on feet (R26.81);Other abnormalities of gait and mobility (R26.89);Muscle weakness (generalized) (M62.81) Pain - Right/Left: Right Pain - part of body: Hip     Time: 9798-9211 PT Time Calculation (min) (ACUTE ONLY): 36 min  Charges:  $Gait Training: 8-22 mins $Therapeutic Exercise: 8-22 mins                     Dorothy Spark, SPT   01/04/2019, 4:06 PM

## 2019-01-04 NOTE — Progress Notes (Signed)
   Subjective: 2 Days Post-Op Procedure(s) (LRB): TOTAL HIP ARTHROPLASTY ANTERIOR APPROACH (Right) Patient reports pain as 4 on 0-10 scale.   Patient is well, and has had no acute complaints or problems Denies any CP, SOB, ABD pain. We will continue therapy today.  Plan is to go Home after hospital stay.  Objective: Vital signs in last 24 hours: Temp:  [98.4 F (36.9 C)-98.9 F (37.2 C)] 98.4 F (36.9 C) (01/16 0826) Pulse Rate:  [78-83] 83 (01/16 0826) Resp:  [18-19] 18 (01/16 0826) BP: (128-142)/(59-64) 133/59 (01/16 0826) SpO2:  [92 %-95 %] 95 % (01/16 0826)  Intake/Output from previous day: 01/15 0701 - 01/16 0700 In: 240 [P.O.:240] Out: 275 [Urine:275] Intake/Output this shift: Total I/O In: -  Out: 450 [Urine:450]  Recent Labs    01/01/19 1207 01/03/19 0416 01/04/19 0528  HGB 13.0 10.0* 10.4*   Recent Labs    01/03/19 0416 01/04/19 0528  WBC 8.7 11.7*  RBC 3.41* 3.48*  HCT 30.6* 31.9*  PLT 209 244   Recent Labs    01/01/19 1207 01/03/19 0416  NA 139 133*  K 3.8 3.5  CL 104 101  CO2 25 25  BUN 10 10  CREATININE 0.98 0.88  GLUCOSE 93 113*  CALCIUM 9.2 8.1*   Recent Labs    01/03/19 0416 01/04/19 0528  INR 1.18 1.23    EXAM General - Patient is Alert, Appropriate and Oriented Extremity - Neurovascular intact Sensation intact distally Intact pulses distally Dorsiflexion/Plantar flexion intact No cellulitis present Compartment soft  Negative Homans sign bilaterally. Dressing - dressing C/D/I and no drainage, prevena intact Motor Function - intact, moving foot and toes well on exam.   Past Medical History:  Diagnosis Date  . Anxiety   . Arthritis    rheumatoid  . Basal cell carcinoma of skin    skin  . Colon polyps   . Depression   . GERD (gastroesophageal reflux disease)   . Gout   . Hemorrhoids   . Hypertension   . Lumbar radiculopathy 09/25/2014  . Neuropathy   . Osteoarthritis   . Pneumonia   . Pulmonary embolism ([place])  05/25/2014   Overview:  Times 2 on anticoagulation a.  Times two.   b.  Filter placed.    Last Assessment & Plan:  Times 2 on anticoagulation a.  Times two.   b.  Filter placed. No current bleeding noted.    . Reflux   . Sleep apnea   . Umbilical hernia     Assessment/Plan:   2 Days Post-Op Procedure(s) (LRB): TOTAL HIP ARTHROPLASTY ANTERIOR APPROACH (Right) Active Problems:   Status post total hip replacement, right  Estimated body mass index is 42.83 kg/m as calculated from the following:   Height as of this encounter: 5\' 9"  (1.753 m).   Weight as of this encounter: 131.5 kg. Advance diet Up with therapy, continue to monitor heart rate and oxygen saturations during ambulation. Needs BM Labs and VSS Recheck labs in the am Continue bridging with Lovenox CM to assist with discharge to home with home health PT  DVT Prophylaxis - Lovenox, Coumadin, Foot Pumps and TED hose Weight-Bearing as tolerated to left leg   T. Rachelle Hora, PA-C [place] 01/04/2019, 9:24 AM

## 2019-01-05 LAB — CBC
HCT: 30.2 % — ABNORMAL LOW (ref 39.0–52.0)
Hemoglobin: 9.8 g/dL — ABNORMAL LOW (ref 13.0–17.0)
MCH: 29.4 pg (ref 26.0–34.0)
MCHC: 32.5 g/dL (ref 30.0–36.0)
MCV: 90.7 fL (ref 80.0–100.0)
NRBC: 0 % (ref 0.0–0.2)
Platelets: 235 10*3/uL (ref 150–400)
RBC: 3.33 MIL/uL — ABNORMAL LOW (ref 4.22–5.81)
RDW: 15.2 % (ref 11.5–15.5)
WBC: 9.6 10*3/uL (ref 4.0–10.5)

## 2019-01-05 LAB — PROTIME-INR
INR: 1.23
Prothrombin Time: 15.4 seconds — ABNORMAL HIGH (ref 11.4–15.2)

## 2019-01-05 MED ORDER — WARFARIN SODIUM 4 MG PO TABS
4.0000 mg | ORAL_TABLET | Freq: Once | ORAL | Status: AC
  Administered 2019-01-05: 4 mg via ORAL
  Filled 2019-01-05: qty 1

## 2019-01-05 MED ORDER — BISACODYL 5 MG PO TBEC: 5.0000 mg | DELAYED_RELEASE_TABLET | Freq: Every day | ORAL | 0 refills | Status: AC | PRN

## 2019-01-05 MED ORDER — ENOXAPARIN SODIUM 40 MG/0.4ML ~~LOC~~ SOLN: 40.0000 mg | Freq: Two times a day (BID) | SUBCUTANEOUS | 0 refills | Status: DC

## 2019-01-05 MED ORDER — OXYCODONE HCL 5 MG PO TABS: 5.0000 mg | ORAL_TABLET | ORAL | 0 refills | Status: DC | PRN

## 2019-01-05 MED ORDER — WARFARIN SODIUM 4 MG PO TABS: 4.0000 mg | ORAL_TABLET | Freq: Once | ORAL | Status: DC

## 2019-01-05 NOTE — Progress Notes (Signed)
Nsg Discharge Note  Admit Date:  01/02/2019 Discharge date: 01/05/2019   Brain Hilts to be D/C'd Home per MD order.  AVS completed.  Copy for chart, and copy for patient signed, and dated. Patient/caregiver able to verbalize understanding.  Discharge Medication: Allergies as of 01/05/2019      Reactions   Duloxetine Other (See Comments)   Hallucination   Pseudoephedrine Hcl Other (See Comments)   ELEVATED BLOOD PRESSURE   Shellfish Allergy Swelling    "THROAT CLOSING"      Medication List    STOP taking these medications   HYDROcodone-acetaminophen 10-325 MG tablet Commonly known as:  NORCO     TAKE these medications   acetaminophen 500 MG tablet Commonly known as:  TYLENOL Take 1,000 mg by mouth See admin instructions. Take 2 tablets (1000 mg) by mouth scheduled in the morning & every 6 hours as needed for pain.   allopurinol 300 MG tablet Commonly known as:  ZYLOPRIM Take 300 mg by mouth daily.   amLODipine 2.5 MG tablet Commonly known as:  NORVASC Take 2.5 mg by mouth daily.   benazepril 10 MG tablet Commonly known as:  LOTENSIN Take 10 mg by mouth daily. Reported on 03/08/2016   bisacodyl 5 MG EC tablet Commonly known as:  DULCOLAX Take 1 tablet (5 mg total) by mouth daily as needed for moderate constipation.   enoxaparin 40 MG/0.4ML injection Commonly known as:  LOVENOX Inject 0.4 mLs (40 mg total) into the skin every 12 (twelve) hours. Check INR Monday 01/08/19. Discontinue lovenox once INR 2-3   gabapentin 300 MG capsule Commonly known as:  NEURONTIN Take 300 mg by mouth 2 (two) times daily.   mirabegron ER 50 MG Tb24 tablet Commonly known as:  MYRBETRIQ Take 1 tablet (50 mg total) by mouth daily.   multivitamin with minerals Tabs tablet Take 1 tablet by mouth daily. One-A-Day   oxyCODONE 5 MG immediate release tablet Commonly known as:  Oxy IR/ROXICODONE Take 1-2 tablets (5-10 mg total) by mouth every 4 (four) hours as needed for moderate pain  (pain score 4-6).   pantoprazole 40 MG tablet Commonly known as:  PROTONIX Take 40 mg by mouth daily.   potassium chloride 10 MEQ tablet Commonly known as:  K-DUR Take 10 mEq by mouth daily.   rosuvastatin 5 MG tablet Commonly known as:  CRESTOR Take 5 mg by mouth daily.   torsemide 10 MG tablet Commonly known as:  DEMADEX Take 10 mg by mouth daily.   venlafaxine XR 75 MG 24 hr capsule Commonly known as:  EFFEXOR-XR Take 75 mg by mouth 2 (two) times daily.   vitamin B-12 1000 MCG tablet Commonly known as:  CYANOCOBALAMIN Take 1,000 mcg by mouth daily.   warfarin 6 MG tablet Commonly known as:  COUMADIN Take 3-6 mg by mouth See admin instructions. Take 0.5 tablet (3 mg) by mouth on Tuesdays & Thursdays, then take 1 tablet (6 mg) by mouth all other days.       Discharge Assessment: Vitals:   01/05/19 0830 01/05/19 1714  BP: 132/64 (!) 117/58  Pulse: 85 79  Resp: 20 18  Temp: 98 F (36.7 C) (!) 97.4 F (36.3 C)  SpO2: 97% 95%   Skin clean, dry and intact without evidence of skin break down, no evidence of skin tears noted. IV catheter discontinued intact. Site without signs and symptoms of complications - no redness or edema noted at insertion site, patient denies c/o pain - only slight  tenderness at site.  Dressing with slight pressure applied.  D/c Instructions-Education: Discharge instructions given to patient/family with verbalized understanding. D/c education completed with patient/family including follow up instructions, medication list, d/c activities limitations if indicated, with other d/c instructions as indicated by MD - patient able to verbalize understanding, all questions fully answered. Patient instructed to return to ED, call 911, or call MD for any changes in condition.  Patient escorted via [place], and D/C home via private auto.  Eda Keys, RN 01/05/2019 5:22 PM

## 2019-01-05 NOTE — Progress Notes (Signed)
Physical Therapy Treatment Patient Details Name: Adrian [name] MRN: [id number] DOB: 1946-12-12 Today's Date: 01/05/2019    History of Present Illness      PT Comments    Adrian [name] demonstrated improved R knee flexion while ambulating. Pt however has decreased activity tolerance due to fatigue and muscle weakness. Pt ambulated 50 ft during session with intermittent rest breaks. Pt became pale with slight perspiration at which point he indicated that he needed to sit, upon sitting he mentioned 6/10 dizziness. BP on LUE 143/121 then 131/65 on RUE. Once in sitting pt stated dizziness decreased to 4/10. Per PT judgement further exercise was held. RN and PA were notified of vitals and dizzy spell. Per PA request PT to see pt this afternoon prior to discharge to reassess dizziness to ensure pt is safe to be discharged.     Follow Up Recommendations        Equipment Recommendations       Recommendations for Other Services       Precautions / Restrictions      Mobility  Bed Mobility                  Transfers                    Ambulation/Gait                 Stairs             Wheelchair Mobility    Modified Rankin (Stroke Patients Only)       Balance                                            Cognition                                              Exercises      General Comments        Pertinent Vitals/Pain      Home Living                      Prior Function            PT Goals (current goals can now be found in the care plan section)      Frequency           PT Plan      Co-evaluation              AM-PAC PT "6 Clicks" Mobility   Outcome Measure                   End of Session               Time:  -     Charges:                       Dorothy Spark, SPT   {01/05/2019, 1:33 PM

## 2019-01-05 NOTE — Care Management (Signed)
Called [facility] pharmacy to get price of Lovenox  Lovenox is not covered by insurance 6 syringes is $194.00 Notified the patient

## 2019-01-05 NOTE — Care Management Important Message (Signed)
Important Message  Patient Details  Name: Adrian [name] MRN: [id number] Date of Birth: 05-17-1946   Medicare Important Message Given:  Yes    Juliann Pulse A Rosemary Mossbarger 01/05/2019, 11:52 AM

## 2019-01-05 NOTE — Care Management (Signed)
Patient chooses [facility] for [facility] PT, notified Helene Kelp with [facility], Notified Brad with [facility] that the patient needs the RW to go home, I asjked Helene Kelp with [facility] if the PT can do0 PT/INR at home for Patient as that will need to be checked Check price of Lovenox before Patient Discharges

## 2019-01-05 NOTE — Care Management (Signed)
Notified the patient that Lovenox is not covered by insurance.  I helped the patient download [facility] and showed him how to look up the mediation on his cel phone, he stated that his family can help him do that and that yes he will get his medication filled, I expressed the importance of taking the medication as prescribed to help avoid a blood clot,  The patient stated understanding and agrees to get the medication and take as prescribed

## 2019-01-05 NOTE — Care Management (Signed)
[facility] "Adrian [name]" has accepted the patient for PT and nursing

## 2019-01-05 NOTE — Care Management (Signed)
Notified Helene Kelp with [facility] that PT/INR needs to be drawn Monday

## 2019-01-05 NOTE — Care Management (Signed)
Notified Physician that [facility] PT does not draw PT/INR with [facility], will need a [facility] Nurse order if need drawn at home

## 2019-01-05 NOTE — Progress Notes (Addendum)
   Subjective: 3 Days Post-Op Procedure(s) (LRB): TOTAL HIP ARTHROPLASTY ANTERIOR APPROACH (Right) Patient reports pain as 0 on 0-10 scale. Severe pain with Weight bearing   Patient is well, and has had no acute complaints or problems Denies any CP, SOB, ABD pain. We will continue therapy today.  Plan is to go Home after hospital stay.  Objective: Vital signs in last 24 hours: Temp:  [98.1 F (36.7 C)-99.1 F (37.3 C)] 99.1 F (37.3 C) (01/16 2350) Pulse Rate:  [77-83] 77 (01/16 2350) Resp:  [16-20] 16 (01/16 2350) BP: (107-148)/(59-71) 148/71 (01/16 2350) SpO2:  [94 %-96 %] 94 % (01/16 2350)  Intake/Output from previous day: 01/16 0701 - 01/17 0700 In: 480 [P.O.:480] Out: 900 [Urine:900] Intake/Output this shift: No intake/output data recorded.  Recent Labs    01/03/19 0416 01/04/19 0528 01/05/19 0319  HGB 10.0* 10.4* 9.8*   Recent Labs    01/04/19 0528 01/05/19 0319  WBC 11.7* 9.6  RBC 3.48* 3.33*  HCT 31.9* 30.2*  PLT 244 235   Recent Labs    01/03/19 0416  NA 133*  K 3.5  CL 101  CO2 25  BUN 10  CREATININE 0.88  GLUCOSE 113*  CALCIUM 8.1*   Recent Labs    01/04/19 0528 01/05/19 0319  INR 1.23 1.23    EXAM General - Patient is Alert, Appropriate and Oriented Extremity - Neurovascular intact Sensation intact distally Intact pulses distally Dorsiflexion/Plantar flexion intact No cellulitis present Compartment soft  Negative Homans sign bilaterally. Dressing - dressing C/D/I and no drainage, prevena intact Motor Function - intact, moving foot and toes well on exam.   Past Medical History:  Diagnosis Date  . Anxiety   . Arthritis    rheumatoid  . Basal cell carcinoma of skin    skin  . Colon polyps   . Depression   . GERD (gastroesophageal reflux disease)   . Gout   . Hemorrhoids   . Hypertension   . Lumbar radiculopathy 09/25/2014  . Neuropathy   . Osteoarthritis   . Pneumonia   . Pulmonary embolism ([place]) 05/25/2014   Overview:   Times 2 on anticoagulation a.  Times two.   b.  Filter placed.    Last Assessment & Plan:  Times 2 on anticoagulation a.  Times two.   b.  Filter placed. No current bleeding noted.    . Reflux   . Sleep apnea   . Umbilical hernia     Assessment/Plan:   3 Days Post-Op Procedure(s) (LRB): TOTAL HIP ARTHROPLASTY ANTERIOR APPROACH (Right) Active Problems:   Status post total hip replacement, right  Estimated body mass index is 42.83 kg/m as calculated from the following:   Height as of this encounter: 5\' 9"  (1.753 m).   Weight as of this encounter: 131.5 kg. Advance diet Up with therapy, continue to monitor heart rate and oxygen saturations during ambulation. Needs BM Labs and VSS Recheck labs in the am Continue bridging with Lovenox CM to assist with discharge to home with home health PT. possible discharge to home today if good progress of physical therapy.  DVT Prophylaxis - Lovenox, Coumadin, Foot Pumps and TED hose Weight-Bearing as tolerated to left leg   T. Rachelle Hora, PA-C [place] 01/05/2019, 7:59 AM

## 2019-01-05 NOTE — Progress Notes (Signed)
Physical Therapy Treatment Patient Details Name: Adrian [name] MRN: [id number] DOB: 10-Jan-1946 Today's Date: 01/05/2019    History of Present Illness Pt is a 73 y/o M s/p R THA.  Pt's PMH includes lumbar radiculopathy, neuropathy, PE.     PT Comments    Adrian [name] is progressing towards his mobility goals. He however is limited by his pain, fatigue, and weakness. Pt ambulated 200 ft around nurses station requiring only one rest break. Rest break required due to becoming weak per patient report. Following rest break gait significantly improved from a step to pattern to step through. SpO2 and HR monitored throughout entire session - SpO2 never dropped below mid 90's and HR did not get above 110 with activity/ambulating.     Follow Up Recommendations  Home health PT     Equipment Recommendations  Other (comment)(bariatric RW )    Recommendations for Other Services       Precautions / Restrictions Precautions Precautions: Fall Precaution Comments: direct anterior, no hip precautions Restrictions Weight Bearing Restrictions: Yes RLE Weight Bearing: Partial weight bearing RLE Partial Weight Bearing Percentage or Pounds: 50    Mobility  Bed Mobility Overal bed mobility: Needs Assistance Bed Mobility: Supine to Sit     Supine to sit: Min guard;HOB elevated     General bed mobility comments: PT in front of pt throughout transfer for safety.   Transfers Overall transfer level: Needs assistance Equipment used: (bariatric RW ) Transfers: Sit to/from Stand Sit to Stand: Min guard;From elevated surface(bed elevated then placed in chair at end of session)         General transfer comment: pt struggled with eccentric control going stand>sit. with practice pt gained more control. SpO2 remained in mid 90's and HR mid 90's.   Ambulation/Gait Ambulation/Gait assistance: Min guard;+2 safety/equipment Gait Distance (Feet): 200 Feet Assistive device: (bariatric RW ) Gait  Pattern/deviations: Step-through pattern;Step-to pattern(step to pattern noted with pt fatigue) Gait velocity: decreased   General Gait Details: Pt on RA throughout the treatment. Pt ambulated 200 feet around nurses station. One 1 minute seated rest break was required due to pt report of feeling weak. Pt reports no dizziness or chest pain. Gait significantly improved following rest break. SpO2 in mid 90's and HR no higher than 110 while walking. Instructed pt to take larger steps. Pt demonstrated more control and awareness of RW management. Step to gait pattern was noted prior to seated rest break and step through pattern for the rest of the session.   Stairs             Wheelchair Mobility    Modified Rankin (Stroke Patients Only)       Balance Overall balance assessment: Needs assistance Sitting-balance support: No upper extremity supported;Feet unsupported Sitting balance-Leahy Scale: Fair     Standing balance support: Bilateral upper extremity supported;During functional activity Standing balance-Leahy Scale: Poor Standing balance comment: Pt relies on BUE support for static and dynamic activities                            Cognition Arousal/Alertness: Awake/alert Behavior During Therapy: WFL for tasks assessed/performed Overall Cognitive Status: Within Functional Limits for tasks assessed                                        Exercises Total Joint Exercises Knee Flexion: AROM;5  reps;Standing(stretch anterior portion of R thigh) Other Exercises Other Exercises: Instructed pt in 5x sit to stands from elelvated surface. First descent to sit was uncontrolled then pt demonstrated more control with the following reps.  Other Exercises: Instructed pt in forward stepping with a focus on R knee flexion x4. Verbal cuing required for pt to perform more knee flexion.     General Comments General comments (skin integrity, edema, etc.): SpO2 and HR  monitored throughout entire session no concerns with vitals taken.       Pertinent Vitals/Pain Pain Assessment: Faces Faces Pain Scale: Hurts whole lot Pain Location: R hip Pain Descriptors / Indicators: Burning;Aching;Grimacing;Moaning Pain Intervention(s): Monitored during session    Home Living                      Prior Function            PT Goals (current goals can now be found in the care plan section) Acute Rehab PT Goals Patient Stated Goal: to be as independent as possible PT Goal Formulation: With patient Time For Goal Achievement: 01/16/19 Potential to Achieve Goals: Good Progress towards PT goals: Progressing toward goals    Frequency    BID      PT Plan Current plan remains appropriate    Co-evaluation              AM-PAC PT "6 Clicks" Mobility   Outcome Measure  Help needed turning from your back to your side while in a flat bed without using bedrails?: A Little Help needed moving from lying on your back to sitting on the side of a flat bed without using bedrails?: A Little Help needed moving to and from a bed to a chair (including a wheelchair)?: A Little Help needed standing up from a chair using your arms (e.g., wheelchair or bedside chair)?: A Little Help needed to walk in hospital room?: A Little Help needed climbing 3-5 steps with a railing? : A Little 6 Click Score: 18    End of Session Equipment Utilized During Treatment: Gait belt Activity Tolerance: Patient limited by fatigue Patient left: in chair;with chair alarm set;with family/visitor present;with SCD's reapplied Nurse Communication: Mobility status PT Visit Diagnosis: Pain;Unsteadiness on feet (R26.81);Other abnormalities of gait and mobility (R26.89);Muscle weakness (generalized) (M62.81) Pain - Right/Left: Right Pain - part of body: Hip     Time: 1610-9604 PT Time Calculation (min) (ACUTE ONLY): 44 min  Charges:  $Gait Training: 23-37 mins $Therapeutic  Activity: 8-22 mins                     Dorothy Spark, SPT   01/05/2019, 4:12 PM

## 2019-01-05 NOTE — Discharge Instructions (Signed)
ANTERIOR APPROACH TOTAL HIP REPLACEMENT POSTOPERATIVE DIRECTIONS   Hip Rehabilitation, Guidelines Following Surgery  The results of a hip operation are greatly improved after range of motion and muscle strengthening exercises. Follow all safety measures which are given to protect your hip. If any of these exercises cause increased pain or swelling in your joint, decrease the amount until you are comfortable again. Then slowly increase the exercises. Call your caregiver if you have problems or questions.   HOME CARE INSTRUCTIONS  Remove items at home which could result in a fall. This includes throw rugs or furniture in walking pathways.   ICE to the affected hip every three hours for 30 minutes at a time and then as needed for pain and swelling.  Continue to use ice on the hip for pain and swelling from surgery. You may notice swelling that will progress down to the foot and ankle.  This is normal after surgery.  Elevate the leg when you are not up walking on it.    Continue to use the breathing machine which will help keep your temperature down.  It is common for your temperature to cycle up and down following surgery, especially at night when you are not up moving around and exerting yourself.  The breathing machine keeps your lungs expanded and your temperature down.  Do not place pillow under knee, focus on keeping the knee straight while resting  DIET You may resume your previous home diet once your are discharged from the hospital.  DRESSING / WOUND CARE / SHOWERING Please remove provena negative pressure dressing on 01/12/2019 and apply honey comb dressing. Keep dressing clean and dry at all times.  ACTIVITY Walk with your walker as instructed. Use walker as long as suggested by your caregivers. Avoid periods of inactivity such as sitting longer than an hour when not asleep. This helps prevent blood clots.  You may resume a sexual relationship in one month or when given the OK by  your doctor.  You may return to work once you are cleared by your doctor.  Do not drive a car for 6 weeks or until released by you surgeon.  Do not drive while taking narcotics.  WEIGHT BEARING Weight bearing as tolerated. Use walker/cane as needed for at least 4 weeks post op.  POSTOPERATIVE CONSTIPATION PROTOCOL Constipation - defined medically as fewer than three stools per week and severe constipation as less than one stool per week.  One of the most common issues patients have following surgery is constipation.  Even if you have a regular bowel pattern at home, your normal regimen is likely to be disrupted due to multiple reasons following surgery.  Combination of anesthesia, postoperative narcotics, change in appetite and fluid intake all can affect your bowels.  In order to avoid complications following surgery, here are some recommendations in order to help you during your recovery period.  Colace (docusate) - Pick up an over-the-counter form of Colace or another stool softener and take twice a day as long as you are requiring postoperative pain medications.  Take with a full glass of water daily.  If you experience loose stools or diarrhea, hold the colace until you stool forms back up.  If your symptoms do not get better within 1 week or if they get worse, check with your doctor.  Dulcolax (bisacodyl) - Pick up over-the-counter and take as directed by the product packaging as needed to assist with the movement of your bowels.  Take with a full  glass of water.  Use this product as needed if not relieved by Colace only.  ° °MiraLax (polyethylene glycol) - Pick up over-the-counter to have on hand.  MiraLax is a solution that will increase the amount of water in your bowels to assist with bowel movements.  Take as directed and can mix with a glass of water, juice, soda, coffee, or tea.  Take if you go more than two days without a movement. °Do not use MiraLax more than once per day. Call your  doctor if you are still constipated or irregular after using this medication for 7 days in a row. ° °If you continue to have problems with postoperative constipation, please contact the office for further assistance and recommendations.  If you experience "the worst abdominal pain ever" or develop nausea or vomiting, please contact the office immediatly for further recommendations for treatment. ° °ITCHING ° If you experience itching with your medications, try taking only a single pain pill, or even half a pain pill at a time.  You can also use Benadryl over the counter for itching or also to help with sleep.  ° °TED HOSE STOCKINGS °Wear the elastic stockings on both legs for six weeks following surgery during the day but you may remove then at night for sleeping. ° °MEDICATIONS °See your medication summary on the “After Visit Summary” that the nursing staff will review with you prior to discharge.  You may have some home medications which will be placed on hold until you complete the course of blood thinner medication.  It is important for you to complete the blood thinner medication as prescribed by your surgeon.  Continue your approved medications as instructed at time of discharge. ° °PRECAUTIONS °If you experience chest pain or shortness of breath - call 911 immediately for transfer to the hospital emergency department.  °If you develop a fever greater that 101 F, purulent drainage from wound, increased redness or drainage from wound, foul odor from the wound/dressing, or calf pain - CONTACT YOUR SURGEON.   °                                                °FOLLOW-UP APPOINTMENTS °Make sure you keep all of your appointments after your operation with your surgeon and caregivers. You should call the office at the above phone number and make an appointment for approximately two weeks after the date of your surgery or on the date instructed by your surgeon outlined in the "After Visit Summary". ° °RANGE OF MOTION  AND STRENGTHENING EXERCISES  °These exercises are designed to help you keep full movement of your hip joint. Follow your caregiver's or physical therapist's instructions. Perform all exercises about fifteen times, three times per day or as directed. Exercise both hips, even if you have had only one joint replacement. These exercises can be done on a training (exercise) mat, on the floor, on a table or on a bed. Use whatever works the best and is most comfortable for you. Use music or television while you are exercising so that the exercises are a pleasant break in your day. This will make your life better with the exercises acting as a break in routine you can look forward to.  °Lying on your back, slowly slide your foot toward your buttocks, raising your knee up off the floor. Then slowly   slide your foot back down until your leg is straight again.  Lying on your back spread your legs as far apart as you can without causing discomfort.  Lying on your side, raise your upper leg and foot straight up from the floor as far as is comfortable. Slowly lower the leg and repeat.  Lying on your back, tighten up the muscle in the front of your thigh (quadriceps muscles). You can do this by keeping your leg straight and trying to raise your heel off the floor. This helps strengthen the largest muscle supporting your knee.  Lying on your back, tighten up the muscles of your buttocks both with the legs straight and with the knee bent at a comfortable angle while keeping your heel on the floor.   IF YOU ARE TRANSFERRED TO A SKILLED REHAB FACILITY If the patient is transferred to a skilled rehab facility following release from the hospital, a list of the current medications will be sent to the facility for the patient to continue.  When discharged from the skilled rehab facility, please have the facility set up the patient's [place] prior to being released. Also, the skilled facility will be responsible  for providing the patient with their medications at time of release from the facility to include their pain medication, the muscle relaxants, and their blood thinner medication. If the patient is still at the rehab facility at time of the two week follow up appointment, the skilled rehab facility will also need to assist the patient in arranging follow up appointment in our office and any transportation needs.  MAKE SURE YOU:  Understand these instructions.  Get help right away if you are not doing well or get worse.    Pick up stool softner and laxative for home use following surgery while on pain medications. Continue to use ice for pain and swelling after surgery. Do not use any lotions or creams on the incision until instructed by your surgeon.

## 2019-01-05 NOTE — Discharge Summary (Signed)
Physician Discharge Summary  Patient ID: Adrian [name] MRN: [id number] DOB/AGE: 1946/11/12 73 y.o.  Admit date: 01/02/2019 Discharge date: 01/05/2019  Admission Diagnoses:  PRIMARY OSTEOARTHRITIS OF RIGHT HIP   Discharge Diagnoses: Patient Active Problem List   Diagnosis Date Noted  . Status post total hip replacement, right 01/02/2019  . Chest pain 08/23/2016  . Near syncope 08/23/2016  . Osteoarthritis of both hips 03/08/2016  . Arthritis of knee, degenerative 02/27/2016  . Elevated sedimentation rate 02/11/2016  . Elevated C-reactive protein (CRP) 02/11/2016  . Abnormal MRI, lumbar spine (02/03/2016) 02/10/2016  . Lumbar spondylosis 02/10/2016  . Abnormal NCS (nerve conduction studies) 02/10/2016  . Chronic anticoagulation (Coumadin and Plaquenil) (due to pulmonary embolism) 02/09/2016  . Chronic low back pain (Location of Tertiary source of pain) (Right) 02/09/2016  . Chronic pain 12/10/2015  . Neuropathy ([place]) (lower extremity peripheral neuropathy diagnosed by EMG and PNCV done by Dr. Melrose Nakayama) 12/10/2015  . Chronic lower extremity pain (Location of Primary Source of Pain) (Bilateral) (R>L) 12/10/2015  . Chronic hand pain (Bilateral) (neuropathy) 12/10/2015  . Chronic knee pain (Location of Secondary source of pain) (Bilateral) (R>L) 12/10/2015  . Inflammatory pain 12/10/2015  . Neuropathic pain 12/10/2015  . Neurogenic pain 12/10/2015  . Chronic lumbar radicular pain (S1 Dermatome) (Right) 12/10/2015  . Long term current use of opiate analgesic 12/10/2015  . Long term prescription opiate use 12/10/2015  . Opiate use 12/10/2015  . Bleeding gastrointestinal 12/10/2015  . HLD (hyperlipidemia) 12/10/2015  . History of pulmonary embolism x 2 12/10/2015  . Chronic lumbar radiculopathy (Right) 12/10/2015  . Diffuse myofascial pain syndrome 12/10/2015  . Encounter for therapeutic drug level monitoring 12/10/2015  . Encounter for chronic pain management 12/10/2015  . Encounter  for pain management planning 12/10/2015  . Mixed simple and mucopurulent chronic bronchitis ([place]) 03/27/2015  . Loss of feeling or sensation 02/11/2015  . Polyneuropathy 02/11/2015  . Low serum cobalamin 11/11/2014  . Intermittent claudication ([place]) 09/25/2014  . Major depression in remission ([place]) 09/25/2014  . Lumbar canal stenosis (Severe: L4-5) (Moderate: L3-4 and L5-S1) (Mild: L1-2, L2-3) 09/25/2014  . Morbid (severe) obesity due to excess calories ([place]) 09/25/2014  . Morbid obesity with BMI of 40.0-44.9, adult ([place]) 09/25/2014  . Anxiety 05/25/2014  . Chronic headache 05/25/2014  . Acid reflux 05/25/2014  . Benign hypertension 05/25/2014  . Blood glucose elevated 05/25/2014  . Obstructive apnea 05/25/2014  . Pulmonary embolism ([place]) 05/25/2014    Past Medical History:  Diagnosis Date  . Anxiety   . Arthritis    rheumatoid  . Basal cell carcinoma of skin    skin  . Colon polyps   . Depression   . GERD (gastroesophageal reflux disease)   . Gout   . Hemorrhoids   . Hypertension   . Lumbar radiculopathy 09/25/2014  . Neuropathy   . Osteoarthritis   . Pneumonia   . Pulmonary embolism ([facility]) 05/25/2014   Overview:  Times 2 on anticoagulation a.  Times two.   b.  Filter placed.    Last Assessment & Plan:  Times 2 on anticoagulation a.  Times two.   b.  Filter placed. No current bleeding noted.    . Reflux   . Sleep apnea   . Umbilical hernia      Transfusion: none   Consultants (if any):   Discharged Condition: Improved  Hospital Course: Adrian [name] is an 73 y.o. male who was admitted 01/02/2019 with a diagnosis of right hip osteoarthritis and  went to the operating room on 01/02/2019 and underwent the above named procedures.    Surgeries: Procedure(s): TOTAL HIP ARTHROPLASTY ANTERIOR APPROACH on 01/02/2019 Patient tolerated the surgery well. Taken to PACU where she was stabilized and then transferred to the orthopedic floor.  Started on Lovenox 40 mg q 12 hrs with   coumadin. Foot pumps applied bilaterally at 80 mm. Heels elevated on bed with rolled towels. No evidence of DVT. Negative [place]. Physical therapy started on day #1 for gait training and transfer. OT started day #1 for ADL and assisted devices.  Patient's foley was d/c on day #1. Patient's IV and hemovac was d/c on day #2.  On post op day #3 patient was stable and ready for discharge to home with HHPT.  Implants: Medacta AMIS 4 stem standard, 58 mm Mpact TM cup with liner and L metal 28 mm head  He was given perioperative antibiotics:  Anti-infectives (From admission, onward)   Start     Dose/Rate Route Frequency Ordered Stop   01/02/19 1400  ceFAZolin (ANCEF) IVPB 2g/100 mL premix     2 g 200 mL/hr over 30 Minutes Intravenous Every 6 hours 01/02/19 1121 01/03/19 0136   01/01/19 2215  ceFAZolin (ANCEF) 3 g in dextrose 5 % 50 mL IVPB     3 g 100 mL/hr over 30 Minutes Intravenous  Once 01/01/19 2214 01/02/19 0910    .  He was given sequential compression devices, early ambulation, and Lovenox coumadin for DVT prophylaxis. INR scheduled to be checked. Will dc lovenox once INR therapeutic.  He benefited maximally from the hospital stay and there were no complications.    Recent vital signs:  Vitals:   01/04/19 2350 01/05/19 0830  BP: (!) 148/71 132/64  Pulse: 77 85  Resp: 16 20  Temp: 99.1 F (37.3 C) 98 F (36.7 C)  SpO2: 94% 97%    Recent laboratory studies:  Lab Results  Component Value Date   HGB 9.8 (L) 01/05/2019   HGB 10.4 (L) 01/04/2019   HGB 10.0 (L) 01/03/2019   Lab Results  Component Value Date   WBC 9.6 01/05/2019   PLT 235 01/05/2019   Lab Results  Component Value Date   INR 1.23 01/05/2019   Lab Results  Component Value Date   NA 133 (L) 01/03/2019   K 3.5 01/03/2019   CL 101 01/03/2019   CO2 25 01/03/2019   BUN 10 01/03/2019   CREATININE 0.88 01/03/2019   GLUCOSE 113 (H) 01/03/2019    Discharge Medications:   Allergies as of 01/05/2019       Reactions   Duloxetine Other (See Comments)   Hallucination   Pseudoephedrine Hcl Other (See Comments)   ELEVATED BLOOD PRESSURE   Shellfish Allergy Swelling    "THROAT CLOSING"      Medication List    STOP taking these medications   HYDROcodone-acetaminophen 10-325 MG tablet Commonly known as:  NORCO     TAKE these medications   acetaminophen 500 MG tablet Commonly known as:  TYLENOL Take 1,000 mg by mouth See admin instructions. Take 2 tablets (1000 mg) by mouth scheduled in the morning & every 6 hours as needed for pain.   allopurinol 300 MG tablet Commonly known as:  ZYLOPRIM Take 300 mg by mouth daily.   amLODipine 2.5 MG tablet Commonly known as:  NORVASC Take 2.5 mg by mouth daily.   benazepril 10 MG tablet Commonly known as:  LOTENSIN Take 10 mg by mouth daily. Reported  on 03/08/2016   bisacodyl 5 MG EC tablet Commonly known as:  DULCOLAX Take 1 tablet (5 mg total) by mouth daily as needed for moderate constipation.   enoxaparin 40 MG/0.4ML injection Commonly known as:  LOVENOX Inject 0.4 mLs (40 mg total) into the skin every 12 (twelve) hours. Check INR Monday 01/08/19. Discontinue lovenox once INR 2-3   gabapentin 300 MG capsule Commonly known as:  NEURONTIN Take 300 mg by mouth 2 (two) times daily.   mirabegron ER 50 MG Tb24 tablet Commonly known as:  MYRBETRIQ Take 1 tablet (50 mg total) by mouth daily.   multivitamin with minerals Tabs tablet Take 1 tablet by mouth daily. One-A-Day   oxyCODONE 5 MG immediate release tablet Commonly known as:  Oxy IR/ROXICODONE Take 1-2 tablets (5-10 mg total) by mouth every 4 (four) hours as needed for moderate pain (pain score 4-6).   pantoprazole 40 MG tablet Commonly known as:  PROTONIX Take 40 mg by mouth daily.   potassium chloride 10 MEQ tablet Commonly known as:  K-DUR Take 10 mEq by mouth daily.   rosuvastatin 5 MG tablet Commonly known as:  CRESTOR Take 5 mg by mouth daily.   torsemide 10 MG  tablet Commonly known as:  DEMADEX Take 10 mg by mouth daily.   venlafaxine XR 75 MG 24 hr capsule Commonly known as:  EFFEXOR-XR Take 75 mg by mouth 2 (two) times daily.   vitamin B-12 1000 MCG tablet Commonly known as:  CYANOCOBALAMIN Take 1,000 mcg by mouth daily.   warfarin 6 MG tablet Commonly known as:  COUMADIN Take 3-6 mg by mouth See admin instructions. Take 0.5 tablet (3 mg) by mouth on Tuesdays & Thursdays, then take 1 tablet (6 mg) by mouth all other days.       Diagnostic Studies: Dg Hip Operative Unilat W Or W/o Pelvis Right  Result Date: 01/02/2019 CLINICAL DATA:  Right total hip replacement EXAM: OPERATIVE RIGHT HIP (WITH PELVIS IF PERFORMED) 3 VIEWS TECHNIQUE: Fluoroscopic spot image(s) were submitted for interpretation post-operatively. COMPARISON:  09/06/2018 FINDINGS: Intraoperative spot images demonstrate right hip replacement changes. No visible hardware or bony complicating feature. Normal AP alignment. IMPRESSION: Right hip replacement.  No visible complicating feature. Electronically Signed   By: Rolm Baptise M.D.   On: 01/02/2019 10:02   Dg Hip Unilat W Or W/o Pelvis 2-3 Views Right  Result Date: 01/02/2019 CLINICAL DATA:  Postop day 0 ANTERIOR approach RIGHT total hip arthroplasty. EXAM: DG HIP (WITH OR WITHOUT PELVIS) 2-3V RIGHT COMPARISON:  Intraoperative RIGHT hip x-rays earlier same day, 08/27/2018 and earlier. FINDINGS: Anatomic alignment of the RIGHT hip prosthesis post RIGHT total hip arthroplasty. Surgical drain in place. No acute complicating features. Protrusio deformity of the acetabulum as noted previously. IMPRESSION: Anatomic alignment of the RIGHT total hip arthroplasty without acute complicating features. Electronically Signed   By: Evangeline Dakin M.D.   On: 01/02/2019 10:42    Disposition:     Follow-up Information    Duanne Guess, PA-C Follow up in 2 week(s).   Specialties:  Orthopedic Surgery, Emergency Medicine Contact  information: [place] [address] [phone]            Signed: Feliberto Gottron 01/05/2019, 12:07 PM

## 2019-01-07 DIAGNOSIS — M1612 Unilateral primary osteoarthritis, left hip: Secondary | ICD-10-CM | POA: Diagnosis not present

## 2019-01-07 DIAGNOSIS — M4807 Spinal stenosis, lumbosacral region: Secondary | ICD-10-CM | POA: Diagnosis not present

## 2019-01-07 DIAGNOSIS — M069 Rheumatoid arthritis, unspecified: Secondary | ICD-10-CM | POA: Diagnosis not present

## 2019-01-07 DIAGNOSIS — G629 Polyneuropathy, unspecified: Secondary | ICD-10-CM | POA: Diagnosis not present

## 2019-01-07 DIAGNOSIS — I10 Essential (primary) hypertension: Secondary | ICD-10-CM | POA: Diagnosis not present

## 2019-01-07 DIAGNOSIS — M1710 Unilateral primary osteoarthritis, unspecified knee: Secondary | ICD-10-CM | POA: Diagnosis not present

## 2019-01-07 DIAGNOSIS — M4726 Other spondylosis with radiculopathy, lumbar region: Secondary | ICD-10-CM | POA: Diagnosis not present

## 2019-01-07 DIAGNOSIS — G8921 Chronic pain due to trauma: Secondary | ICD-10-CM | POA: Diagnosis not present

## 2019-01-07 DIAGNOSIS — Z471 Aftercare following joint replacement surgery: Secondary | ICD-10-CM | POA: Diagnosis not present

## 2019-01-09 ENCOUNTER — Telehealth: Payer: Self-pay | Admitting: Urology

## 2019-01-09 DIAGNOSIS — G8921 Chronic pain due to trauma: Secondary | ICD-10-CM | POA: Diagnosis not present

## 2019-01-09 DIAGNOSIS — G629 Polyneuropathy, unspecified: Secondary | ICD-10-CM | POA: Diagnosis not present

## 2019-01-09 DIAGNOSIS — M1612 Unilateral primary osteoarthritis, left hip: Secondary | ICD-10-CM | POA: Diagnosis not present

## 2019-01-09 DIAGNOSIS — Z471 Aftercare following joint replacement surgery: Secondary | ICD-10-CM | POA: Diagnosis not present

## 2019-01-09 DIAGNOSIS — M4807 Spinal stenosis, lumbosacral region: Secondary | ICD-10-CM | POA: Diagnosis not present

## 2019-01-09 DIAGNOSIS — M4726 Other spondylosis with radiculopathy, lumbar region: Secondary | ICD-10-CM | POA: Diagnosis not present

## 2019-01-09 DIAGNOSIS — M069 Rheumatoid arthritis, unspecified: Secondary | ICD-10-CM | POA: Diagnosis not present

## 2019-01-09 DIAGNOSIS — I10 Essential (primary) hypertension: Secondary | ICD-10-CM | POA: Diagnosis not present

## 2019-01-09 DIAGNOSIS — M1710 Unilateral primary osteoarthritis, unspecified knee: Secondary | ICD-10-CM | POA: Diagnosis not present

## 2019-01-09 NOTE — Telephone Encounter (Signed)
Advised patient to minimize sodas, and fluid intake after 7pm and double voiding prior to bed.  Patient currently uses a CPAP.  He says the Myrbetriq did not help.  Patient has had hip replacement recently and is unable to come to the office.  He will adjust his soda and fluid intake and see if that helps and if not he will make an appointment.

## 2019-01-09 NOTE — Telephone Encounter (Signed)
Pt states that he is still having frequency urinating and would like to see if there was another rx we could call in. Please call back.

## 2019-01-09 NOTE — Telephone Encounter (Signed)
Please see my telephone encounter from 12/21/2018:  "Minimize sodas, minimize fluids after 7pm and double voiding prior to bed Weight loss Needs to get a sleep study re: sleep apnea  Can try myrbetriq if he is interested to come get a month of free samples. Otherwise can schedule follow up to discuss further in clinic."  Did he ever pick up the Mybetriq? He needs to schedule a follow up visit if both anti-cholinergics and mybetriq are not working for him.  Nickolas Madrid, MD 01/09/2019

## 2019-01-10 DIAGNOSIS — M1612 Unilateral primary osteoarthritis, left hip: Secondary | ICD-10-CM | POA: Diagnosis not present

## 2019-01-10 DIAGNOSIS — M1710 Unilateral primary osteoarthritis, unspecified knee: Secondary | ICD-10-CM | POA: Diagnosis not present

## 2019-01-10 DIAGNOSIS — M4807 Spinal stenosis, lumbosacral region: Secondary | ICD-10-CM | POA: Diagnosis not present

## 2019-01-10 DIAGNOSIS — G629 Polyneuropathy, unspecified: Secondary | ICD-10-CM | POA: Diagnosis not present

## 2019-01-10 DIAGNOSIS — G8921 Chronic pain due to trauma: Secondary | ICD-10-CM | POA: Diagnosis not present

## 2019-01-10 DIAGNOSIS — I10 Essential (primary) hypertension: Secondary | ICD-10-CM | POA: Diagnosis not present

## 2019-01-10 DIAGNOSIS — M4726 Other spondylosis with radiculopathy, lumbar region: Secondary | ICD-10-CM | POA: Diagnosis not present

## 2019-01-10 DIAGNOSIS — Z471 Aftercare following joint replacement surgery: Secondary | ICD-10-CM | POA: Diagnosis not present

## 2019-01-10 DIAGNOSIS — M069 Rheumatoid arthritis, unspecified: Secondary | ICD-10-CM | POA: Diagnosis not present

## 2019-01-12 DIAGNOSIS — I10 Essential (primary) hypertension: Secondary | ICD-10-CM | POA: Diagnosis not present

## 2019-01-12 DIAGNOSIS — M4807 Spinal stenosis, lumbosacral region: Secondary | ICD-10-CM | POA: Diagnosis not present

## 2019-01-12 DIAGNOSIS — G629 Polyneuropathy, unspecified: Secondary | ICD-10-CM | POA: Diagnosis not present

## 2019-01-12 DIAGNOSIS — G8921 Chronic pain due to trauma: Secondary | ICD-10-CM | POA: Diagnosis not present

## 2019-01-12 DIAGNOSIS — M1710 Unilateral primary osteoarthritis, unspecified knee: Secondary | ICD-10-CM | POA: Diagnosis not present

## 2019-01-12 DIAGNOSIS — M4726 Other spondylosis with radiculopathy, lumbar region: Secondary | ICD-10-CM | POA: Diagnosis not present

## 2019-01-12 DIAGNOSIS — Z471 Aftercare following joint replacement surgery: Secondary | ICD-10-CM | POA: Diagnosis not present

## 2019-01-12 DIAGNOSIS — M1612 Unilateral primary osteoarthritis, left hip: Secondary | ICD-10-CM | POA: Diagnosis not present

## 2019-01-12 DIAGNOSIS — M069 Rheumatoid arthritis, unspecified: Secondary | ICD-10-CM | POA: Diagnosis not present

## 2019-01-15 DIAGNOSIS — G629 Polyneuropathy, unspecified: Secondary | ICD-10-CM | POA: Diagnosis not present

## 2019-01-15 DIAGNOSIS — G8921 Chronic pain due to trauma: Secondary | ICD-10-CM | POA: Diagnosis not present

## 2019-01-15 DIAGNOSIS — M1710 Unilateral primary osteoarthritis, unspecified knee: Secondary | ICD-10-CM | POA: Diagnosis not present

## 2019-01-15 DIAGNOSIS — I10 Essential (primary) hypertension: Secondary | ICD-10-CM | POA: Diagnosis not present

## 2019-01-15 DIAGNOSIS — M4807 Spinal stenosis, lumbosacral region: Secondary | ICD-10-CM | POA: Diagnosis not present

## 2019-01-15 DIAGNOSIS — Z471 Aftercare following joint replacement surgery: Secondary | ICD-10-CM | POA: Diagnosis not present

## 2019-01-15 DIAGNOSIS — M069 Rheumatoid arthritis, unspecified: Secondary | ICD-10-CM | POA: Diagnosis not present

## 2019-01-15 DIAGNOSIS — M1612 Unilateral primary osteoarthritis, left hip: Secondary | ICD-10-CM | POA: Diagnosis not present

## 2019-01-15 DIAGNOSIS — M4726 Other spondylosis with radiculopathy, lumbar region: Secondary | ICD-10-CM | POA: Diagnosis not present

## 2019-01-16 DIAGNOSIS — G8921 Chronic pain due to trauma: Secondary | ICD-10-CM | POA: Diagnosis not present

## 2019-01-16 DIAGNOSIS — M4726 Other spondylosis with radiculopathy, lumbar region: Secondary | ICD-10-CM | POA: Diagnosis not present

## 2019-01-16 DIAGNOSIS — G629 Polyneuropathy, unspecified: Secondary | ICD-10-CM | POA: Diagnosis not present

## 2019-01-16 DIAGNOSIS — M1612 Unilateral primary osteoarthritis, left hip: Secondary | ICD-10-CM | POA: Diagnosis not present

## 2019-01-16 DIAGNOSIS — M069 Rheumatoid arthritis, unspecified: Secondary | ICD-10-CM | POA: Diagnosis not present

## 2019-01-16 DIAGNOSIS — M1710 Unilateral primary osteoarthritis, unspecified knee: Secondary | ICD-10-CM | POA: Diagnosis not present

## 2019-01-16 DIAGNOSIS — M4807 Spinal stenosis, lumbosacral region: Secondary | ICD-10-CM | POA: Diagnosis not present

## 2019-01-16 DIAGNOSIS — I10 Essential (primary) hypertension: Secondary | ICD-10-CM | POA: Diagnosis not present

## 2019-01-16 DIAGNOSIS — Z471 Aftercare following joint replacement surgery: Secondary | ICD-10-CM | POA: Diagnosis not present

## 2019-01-17 DIAGNOSIS — M1612 Unilateral primary osteoarthritis, left hip: Secondary | ICD-10-CM | POA: Diagnosis not present

## 2019-01-17 DIAGNOSIS — M4726 Other spondylosis with radiculopathy, lumbar region: Secondary | ICD-10-CM | POA: Diagnosis not present

## 2019-01-17 DIAGNOSIS — M4807 Spinal stenosis, lumbosacral region: Secondary | ICD-10-CM | POA: Diagnosis not present

## 2019-01-17 DIAGNOSIS — Z471 Aftercare following joint replacement surgery: Secondary | ICD-10-CM | POA: Diagnosis not present

## 2019-01-17 DIAGNOSIS — G629 Polyneuropathy, unspecified: Secondary | ICD-10-CM | POA: Diagnosis not present

## 2019-01-17 DIAGNOSIS — M069 Rheumatoid arthritis, unspecified: Secondary | ICD-10-CM | POA: Diagnosis not present

## 2019-01-17 DIAGNOSIS — I10 Essential (primary) hypertension: Secondary | ICD-10-CM | POA: Diagnosis not present

## 2019-01-17 DIAGNOSIS — G8921 Chronic pain due to trauma: Secondary | ICD-10-CM | POA: Diagnosis not present

## 2019-01-17 DIAGNOSIS — M1710 Unilateral primary osteoarthritis, unspecified knee: Secondary | ICD-10-CM | POA: Diagnosis not present

## 2019-01-17 DIAGNOSIS — Z7901 Long term (current) use of anticoagulants: Secondary | ICD-10-CM | POA: Diagnosis not present

## 2019-01-18 DIAGNOSIS — G8921 Chronic pain due to trauma: Secondary | ICD-10-CM | POA: Diagnosis not present

## 2019-01-18 DIAGNOSIS — M1612 Unilateral primary osteoarthritis, left hip: Secondary | ICD-10-CM | POA: Diagnosis not present

## 2019-01-18 DIAGNOSIS — I10 Essential (primary) hypertension: Secondary | ICD-10-CM | POA: Diagnosis not present

## 2019-01-18 DIAGNOSIS — Z471 Aftercare following joint replacement surgery: Secondary | ICD-10-CM | POA: Diagnosis not present

## 2019-01-18 DIAGNOSIS — M069 Rheumatoid arthritis, unspecified: Secondary | ICD-10-CM | POA: Diagnosis not present

## 2019-01-18 DIAGNOSIS — G629 Polyneuropathy, unspecified: Secondary | ICD-10-CM | POA: Diagnosis not present

## 2019-01-18 DIAGNOSIS — M4807 Spinal stenosis, lumbosacral region: Secondary | ICD-10-CM | POA: Diagnosis not present

## 2019-01-18 DIAGNOSIS — M4726 Other spondylosis with radiculopathy, lumbar region: Secondary | ICD-10-CM | POA: Diagnosis not present

## 2019-01-18 DIAGNOSIS — M1710 Unilateral primary osteoarthritis, unspecified knee: Secondary | ICD-10-CM | POA: Diagnosis not present

## 2019-01-19 DIAGNOSIS — Z471 Aftercare following joint replacement surgery: Secondary | ICD-10-CM | POA: Diagnosis not present

## 2019-01-19 DIAGNOSIS — G8921 Chronic pain due to trauma: Secondary | ICD-10-CM | POA: Diagnosis not present

## 2019-01-19 DIAGNOSIS — M4807 Spinal stenosis, lumbosacral region: Secondary | ICD-10-CM | POA: Diagnosis not present

## 2019-01-19 DIAGNOSIS — G629 Polyneuropathy, unspecified: Secondary | ICD-10-CM | POA: Diagnosis not present

## 2019-01-19 DIAGNOSIS — M069 Rheumatoid arthritis, unspecified: Secondary | ICD-10-CM | POA: Diagnosis not present

## 2019-01-19 DIAGNOSIS — M4726 Other spondylosis with radiculopathy, lumbar region: Secondary | ICD-10-CM | POA: Diagnosis not present

## 2019-01-19 DIAGNOSIS — M1612 Unilateral primary osteoarthritis, left hip: Secondary | ICD-10-CM | POA: Diagnosis not present

## 2019-01-19 DIAGNOSIS — M1710 Unilateral primary osteoarthritis, unspecified knee: Secondary | ICD-10-CM | POA: Diagnosis not present

## 2019-01-19 DIAGNOSIS — I10 Essential (primary) hypertension: Secondary | ICD-10-CM | POA: Diagnosis not present

## 2019-01-22 DIAGNOSIS — M4807 Spinal stenosis, lumbosacral region: Secondary | ICD-10-CM | POA: Diagnosis not present

## 2019-01-22 DIAGNOSIS — M1710 Unilateral primary osteoarthritis, unspecified knee: Secondary | ICD-10-CM | POA: Diagnosis not present

## 2019-01-22 DIAGNOSIS — M1612 Unilateral primary osteoarthritis, left hip: Secondary | ICD-10-CM | POA: Diagnosis not present

## 2019-01-22 DIAGNOSIS — Z471 Aftercare following joint replacement surgery: Secondary | ICD-10-CM | POA: Diagnosis not present

## 2019-01-22 DIAGNOSIS — G629 Polyneuropathy, unspecified: Secondary | ICD-10-CM | POA: Diagnosis not present

## 2019-01-22 DIAGNOSIS — M069 Rheumatoid arthritis, unspecified: Secondary | ICD-10-CM | POA: Diagnosis not present

## 2019-01-22 DIAGNOSIS — M4726 Other spondylosis with radiculopathy, lumbar region: Secondary | ICD-10-CM | POA: Diagnosis not present

## 2019-01-22 DIAGNOSIS — I10 Essential (primary) hypertension: Secondary | ICD-10-CM | POA: Diagnosis not present

## 2019-01-22 DIAGNOSIS — G8921 Chronic pain due to trauma: Secondary | ICD-10-CM | POA: Diagnosis not present

## 2019-01-24 DIAGNOSIS — Z7901 Long term (current) use of anticoagulants: Secondary | ICD-10-CM | POA: Diagnosis not present

## 2019-01-25 DIAGNOSIS — M1710 Unilateral primary osteoarthritis, unspecified knee: Secondary | ICD-10-CM | POA: Diagnosis not present

## 2019-01-25 DIAGNOSIS — M1612 Unilateral primary osteoarthritis, left hip: Secondary | ICD-10-CM | POA: Diagnosis not present

## 2019-01-25 DIAGNOSIS — M4726 Other spondylosis with radiculopathy, lumbar region: Secondary | ICD-10-CM | POA: Diagnosis not present

## 2019-01-25 DIAGNOSIS — G8921 Chronic pain due to trauma: Secondary | ICD-10-CM | POA: Diagnosis not present

## 2019-01-25 DIAGNOSIS — M069 Rheumatoid arthritis, unspecified: Secondary | ICD-10-CM | POA: Diagnosis not present

## 2019-01-25 DIAGNOSIS — M4807 Spinal stenosis, lumbosacral region: Secondary | ICD-10-CM | POA: Diagnosis not present

## 2019-01-25 DIAGNOSIS — I10 Essential (primary) hypertension: Secondary | ICD-10-CM | POA: Diagnosis not present

## 2019-01-25 DIAGNOSIS — Z471 Aftercare following joint replacement surgery: Secondary | ICD-10-CM | POA: Diagnosis not present

## 2019-01-25 DIAGNOSIS — G629 Polyneuropathy, unspecified: Secondary | ICD-10-CM | POA: Diagnosis not present

## 2019-01-29 DIAGNOSIS — E119 Type 2 diabetes mellitus without complications: Secondary | ICD-10-CM | POA: Diagnosis not present

## 2019-01-29 DIAGNOSIS — I10 Essential (primary) hypertension: Secondary | ICD-10-CM | POA: Diagnosis not present

## 2019-01-29 DIAGNOSIS — E7800 Pure hypercholesterolemia, unspecified: Secondary | ICD-10-CM | POA: Diagnosis not present

## 2019-01-29 DIAGNOSIS — Z79899 Other long term (current) drug therapy: Secondary | ICD-10-CM | POA: Diagnosis not present

## 2019-01-29 DIAGNOSIS — K219 Gastro-esophageal reflux disease without esophagitis: Secondary | ICD-10-CM | POA: Diagnosis not present

## 2019-01-29 DIAGNOSIS — M1611 Unilateral primary osteoarthritis, right hip: Secondary | ICD-10-CM | POA: Diagnosis not present

## 2019-01-29 DIAGNOSIS — Z7901 Long term (current) use of anticoagulants: Secondary | ICD-10-CM | POA: Diagnosis not present

## 2019-01-29 DIAGNOSIS — F3341 Major depressive disorder, recurrent, in partial remission: Secondary | ICD-10-CM | POA: Diagnosis not present

## 2019-01-31 DIAGNOSIS — Z96641 Presence of right artificial hip joint: Secondary | ICD-10-CM | POA: Diagnosis not present

## 2019-01-31 DIAGNOSIS — M25461 Effusion, right knee: Secondary | ICD-10-CM | POA: Diagnosis not present

## 2019-01-31 DIAGNOSIS — M1711 Unilateral primary osteoarthritis, right knee: Secondary | ICD-10-CM | POA: Diagnosis not present

## 2019-01-31 DIAGNOSIS — M1611 Unilateral primary osteoarthritis, right hip: Secondary | ICD-10-CM | POA: Diagnosis not present

## 2019-02-05 DIAGNOSIS — Z7901 Long term (current) use of anticoagulants: Secondary | ICD-10-CM | POA: Diagnosis not present

## 2019-02-14 DIAGNOSIS — M1611 Unilateral primary osteoarthritis, right hip: Secondary | ICD-10-CM | POA: Diagnosis not present

## 2019-02-14 DIAGNOSIS — M1711 Unilateral primary osteoarthritis, right knee: Secondary | ICD-10-CM | POA: Diagnosis not present

## 2019-02-14 DIAGNOSIS — M25562 Pain in left knee: Secondary | ICD-10-CM | POA: Diagnosis not present

## 2019-02-14 DIAGNOSIS — Z96641 Presence of right artificial hip joint: Secondary | ICD-10-CM | POA: Diagnosis not present

## 2019-03-27 DIAGNOSIS — M174 Other bilateral secondary osteoarthritis of knee: Secondary | ICD-10-CM | POA: Diagnosis not present

## 2019-03-27 DIAGNOSIS — M5416 Radiculopathy, lumbar region: Secondary | ICD-10-CM | POA: Diagnosis not present

## 2019-03-27 DIAGNOSIS — M47816 Spondylosis without myelopathy or radiculopathy, lumbar region: Secondary | ICD-10-CM | POA: Diagnosis not present

## 2019-03-27 DIAGNOSIS — M5126 Other intervertebral disc displacement, lumbar region: Secondary | ICD-10-CM | POA: Diagnosis not present

## 2019-03-28 DIAGNOSIS — Z7901 Long term (current) use of anticoagulants: Secondary | ICD-10-CM | POA: Diagnosis not present

## 2019-04-02 DIAGNOSIS — M48062 Spinal stenosis, lumbar region with neurogenic claudication: Secondary | ICD-10-CM | POA: Diagnosis not present

## 2019-04-02 DIAGNOSIS — M5416 Radiculopathy, lumbar region: Secondary | ICD-10-CM | POA: Diagnosis not present

## 2019-04-02 DIAGNOSIS — Z7901 Long term (current) use of anticoagulants: Secondary | ICD-10-CM | POA: Diagnosis not present

## 2019-04-02 DIAGNOSIS — M5126 Other intervertebral disc displacement, lumbar region: Secondary | ICD-10-CM | POA: Diagnosis not present

## 2019-04-09 DIAGNOSIS — M461 Sacroiliitis, not elsewhere classified: Secondary | ICD-10-CM | POA: Diagnosis not present

## 2019-04-09 DIAGNOSIS — M9904 Segmental and somatic dysfunction of sacral region: Secondary | ICD-10-CM | POA: Diagnosis not present

## 2019-04-09 DIAGNOSIS — M9903 Segmental and somatic dysfunction of lumbar region: Secondary | ICD-10-CM | POA: Diagnosis not present

## 2019-04-09 DIAGNOSIS — M5441 Lumbago with sciatica, right side: Secondary | ICD-10-CM | POA: Diagnosis not present

## 2019-04-19 DIAGNOSIS — M461 Sacroiliitis, not elsewhere classified: Secondary | ICD-10-CM | POA: Diagnosis not present

## 2019-04-19 DIAGNOSIS — M9903 Segmental and somatic dysfunction of lumbar region: Secondary | ICD-10-CM | POA: Diagnosis not present

## 2019-04-19 DIAGNOSIS — M9904 Segmental and somatic dysfunction of sacral region: Secondary | ICD-10-CM | POA: Diagnosis not present

## 2019-04-19 DIAGNOSIS — M5441 Lumbago with sciatica, right side: Secondary | ICD-10-CM | POA: Diagnosis not present

## 2019-05-03 DIAGNOSIS — M5126 Other intervertebral disc displacement, lumbar region: Secondary | ICD-10-CM | POA: Diagnosis not present

## 2019-05-03 DIAGNOSIS — M47816 Spondylosis without myelopathy or radiculopathy, lumbar region: Secondary | ICD-10-CM | POA: Diagnosis not present

## 2019-05-03 DIAGNOSIS — M5416 Radiculopathy, lumbar region: Secondary | ICD-10-CM | POA: Diagnosis not present

## 2019-05-16 DIAGNOSIS — Z7901 Long term (current) use of anticoagulants: Secondary | ICD-10-CM | POA: Diagnosis not present

## 2019-05-17 DIAGNOSIS — M5126 Other intervertebral disc displacement, lumbar region: Secondary | ICD-10-CM | POA: Diagnosis not present

## 2019-05-17 DIAGNOSIS — M5416 Radiculopathy, lumbar region: Secondary | ICD-10-CM | POA: Diagnosis not present

## 2019-05-28 DIAGNOSIS — E1142 Type 2 diabetes mellitus with diabetic polyneuropathy: Secondary | ICD-10-CM | POA: Diagnosis not present

## 2019-05-28 DIAGNOSIS — F3341 Major depressive disorder, recurrent, in partial remission: Secondary | ICD-10-CM | POA: Diagnosis not present

## 2019-05-28 DIAGNOSIS — J449 Chronic obstructive pulmonary disease, unspecified: Secondary | ICD-10-CM | POA: Diagnosis not present

## 2019-05-28 DIAGNOSIS — G4733 Obstructive sleep apnea (adult) (pediatric): Secondary | ICD-10-CM | POA: Diagnosis not present

## 2019-05-28 DIAGNOSIS — E119 Type 2 diabetes mellitus without complications: Secondary | ICD-10-CM | POA: Diagnosis not present

## 2019-05-28 DIAGNOSIS — K219 Gastro-esophageal reflux disease without esophagitis: Secondary | ICD-10-CM | POA: Diagnosis not present

## 2019-05-28 DIAGNOSIS — M1A00X0 Idiopathic chronic gout, unspecified site, without tophus (tophi): Secondary | ICD-10-CM | POA: Diagnosis not present

## 2019-05-28 DIAGNOSIS — E7800 Pure hypercholesterolemia, unspecified: Secondary | ICD-10-CM | POA: Diagnosis not present

## 2019-05-28 DIAGNOSIS — I10 Essential (primary) hypertension: Secondary | ICD-10-CM | POA: Diagnosis not present

## 2019-05-28 DIAGNOSIS — G629 Polyneuropathy, unspecified: Secondary | ICD-10-CM | POA: Diagnosis not present

## 2019-05-29 ENCOUNTER — Telehealth: Payer: Self-pay | Admitting: Urology

## 2019-05-29 MED ORDER — MIRABEGRON ER 50 MG PO TB24: 50.0000 mg | ORAL_TABLET | Freq: Every day | ORAL | 11 refills | Status: DC

## 2019-05-29 NOTE — Telephone Encounter (Signed)
Left message (OK per DPR) that prescription was sent in as requested-advised to make follow up if no improvement.

## 2019-05-29 NOTE — Telephone Encounter (Signed)
Ok to give prescription for mybetriq 50mg  daily 30 pills x 11 refills. This is the max dose. If no improvement needs to schedule follow up visit.  Nickolas Madrid, MD 05/29/2019

## 2019-05-29 NOTE — Telephone Encounter (Signed)
Pt states he was given Myrbetriq 50mg  samples at his last office visit, since his visit pt has had hip replacement, started back on the samples and is now asking if he can have a Rx for stronger dose of the Myrbetriq to be sent to [facility] in [place]. Please advise pt if stronger dose is available and if Rx was sent in. Pt # [id number]. Thank you.

## 2019-05-31 MED ORDER — MIRABEGRON ER 50 MG PO TB24: 50.0000 mg | ORAL_TABLET | Freq: Every day | ORAL | 11 refills | Status: DC

## 2019-05-31 NOTE — Addendum Note (Signed)
Addended by: Verlene Mayer A on: 05/31/2019 10:47 AM   Modules accepted: Orders

## 2019-06-14 DIAGNOSIS — M5126 Other intervertebral disc displacement, lumbar region: Secondary | ICD-10-CM | POA: Diagnosis not present

## 2019-06-14 DIAGNOSIS — N3281 Overactive bladder: Secondary | ICD-10-CM | POA: Diagnosis not present

## 2019-06-14 DIAGNOSIS — Z7901 Long term (current) use of anticoagulants: Secondary | ICD-10-CM | POA: Diagnosis not present

## 2019-06-14 DIAGNOSIS — R350 Frequency of micturition: Secondary | ICD-10-CM | POA: Diagnosis not present

## 2019-06-14 DIAGNOSIS — M5416 Radiculopathy, lumbar region: Secondary | ICD-10-CM | POA: Diagnosis not present

## 2019-06-14 DIAGNOSIS — M48062 Spinal stenosis, lumbar region with neurogenic claudication: Secondary | ICD-10-CM | POA: Diagnosis not present

## 2019-07-30 DIAGNOSIS — Z7901 Long term (current) use of anticoagulants: Secondary | ICD-10-CM | POA: Diagnosis not present

## 2019-07-31 DIAGNOSIS — M5126 Other intervertebral disc displacement, lumbar region: Secondary | ICD-10-CM | POA: Diagnosis not present

## 2019-07-31 DIAGNOSIS — M5416 Radiculopathy, lumbar region: Secondary | ICD-10-CM | POA: Diagnosis not present

## 2019-07-31 DIAGNOSIS — M48062 Spinal stenosis, lumbar region with neurogenic claudication: Secondary | ICD-10-CM | POA: Diagnosis not present

## 2019-08-02 ENCOUNTER — Other Ambulatory Visit: Payer: Self-pay | Admitting: Physical Medicine and Rehabilitation

## 2019-08-02 DIAGNOSIS — R0600 Dyspnea, unspecified: Secondary | ICD-10-CM | POA: Diagnosis not present

## 2019-08-02 DIAGNOSIS — R350 Frequency of micturition: Secondary | ICD-10-CM | POA: Diagnosis not present

## 2019-08-02 DIAGNOSIS — M545 Low back pain: Secondary | ICD-10-CM | POA: Diagnosis not present

## 2019-08-02 DIAGNOSIS — Z7901 Long term (current) use of anticoagulants: Secondary | ICD-10-CM | POA: Diagnosis not present

## 2019-08-02 DIAGNOSIS — M5416 Radiculopathy, lumbar region: Secondary | ICD-10-CM

## 2019-08-02 DIAGNOSIS — G8929 Other chronic pain: Secondary | ICD-10-CM | POA: Diagnosis not present

## 2019-08-02 DIAGNOSIS — Z6841 Body Mass Index (BMI) 40.0 and over, adult: Secondary | ICD-10-CM | POA: Diagnosis not present

## 2019-08-09 DIAGNOSIS — H5213 Myopia, bilateral: Secondary | ICD-10-CM | POA: Diagnosis not present

## 2019-08-16 ENCOUNTER — Ambulatory Visit
Admission: RE | Admit: 2019-08-16 | Discharge: 2019-08-16 | Disposition: A | Discharge status: Home / Self Care | Payer: Medicare HMO | Source: Ambulatory Visit | Attending: Physical Medicine and Rehabilitation | Admitting: Physical Medicine and Rehabilitation

## 2019-08-16 ENCOUNTER — Ambulatory Visit: Payer: Medicare HMO

## 2019-08-16 ENCOUNTER — Other Ambulatory Visit: Payer: Self-pay

## 2019-08-16 DIAGNOSIS — M545 Low back pain: Secondary | ICD-10-CM | POA: Diagnosis not present

## 2019-08-16 DIAGNOSIS — Z96641 Presence of right artificial hip joint: Secondary | ICD-10-CM | POA: Diagnosis not present

## 2019-08-16 DIAGNOSIS — G4733 Obstructive sleep apnea (adult) (pediatric): Secondary | ICD-10-CM | POA: Diagnosis not present

## 2019-08-16 DIAGNOSIS — R0600 Dyspnea, unspecified: Secondary | ICD-10-CM | POA: Diagnosis not present

## 2019-08-16 DIAGNOSIS — R911 Solitary pulmonary nodule: Secondary | ICD-10-CM | POA: Diagnosis not present

## 2019-08-16 DIAGNOSIS — J439 Emphysema, unspecified: Secondary | ICD-10-CM | POA: Diagnosis not present

## 2019-08-16 DIAGNOSIS — M5416 Radiculopathy, lumbar region: Secondary | ICD-10-CM | POA: Insufficient documentation

## 2019-08-17 ENCOUNTER — Other Ambulatory Visit: Payer: Self-pay | Admitting: Specialist

## 2019-08-17 DIAGNOSIS — R0609 Other forms of dyspnea: Secondary | ICD-10-CM

## 2019-08-17 DIAGNOSIS — J439 Emphysema, unspecified: Secondary | ICD-10-CM

## 2019-08-21 DIAGNOSIS — R1031 Right lower quadrant pain: Secondary | ICD-10-CM | POA: Diagnosis not present

## 2019-08-21 DIAGNOSIS — E7800 Pure hypercholesterolemia, unspecified: Secondary | ICD-10-CM | POA: Diagnosis not present

## 2019-08-21 DIAGNOSIS — R109 Unspecified abdominal pain: Secondary | ICD-10-CM | POA: Diagnosis not present

## 2019-08-23 DIAGNOSIS — M48062 Spinal stenosis, lumbar region with neurogenic claudication: Secondary | ICD-10-CM | POA: Diagnosis not present

## 2019-08-23 DIAGNOSIS — Z0100 Encounter for examination of eyes and vision without abnormal findings: Secondary | ICD-10-CM | POA: Diagnosis not present

## 2019-08-28 DIAGNOSIS — M48062 Spinal stenosis, lumbar region with neurogenic claudication: Secondary | ICD-10-CM | POA: Diagnosis not present

## 2019-08-28 DIAGNOSIS — M5126 Other intervertebral disc displacement, lumbar region: Secondary | ICD-10-CM | POA: Diagnosis not present

## 2019-08-28 DIAGNOSIS — M5416 Radiculopathy, lumbar region: Secondary | ICD-10-CM | POA: Diagnosis not present

## 2019-08-28 DIAGNOSIS — M5136 Other intervertebral disc degeneration, lumbar region: Secondary | ICD-10-CM | POA: Diagnosis not present

## 2019-09-04 ENCOUNTER — Ambulatory Visit: Payer: Medicare HMO

## 2019-09-04 DIAGNOSIS — M6281 Muscle weakness (generalized): Secondary | ICD-10-CM | POA: Diagnosis not present

## 2019-09-04 DIAGNOSIS — M5442 Lumbago with sciatica, left side: Secondary | ICD-10-CM | POA: Diagnosis not present

## 2019-09-04 DIAGNOSIS — Z79899 Other long term (current) drug therapy: Secondary | ICD-10-CM | POA: Diagnosis not present

## 2019-09-04 DIAGNOSIS — G8929 Other chronic pain: Secondary | ICD-10-CM | POA: Diagnosis not present

## 2019-09-04 DIAGNOSIS — M5441 Lumbago with sciatica, right side: Secondary | ICD-10-CM | POA: Diagnosis not present

## 2019-09-04 DIAGNOSIS — M48062 Spinal stenosis, lumbar region with neurogenic claudication: Secondary | ICD-10-CM | POA: Diagnosis not present

## 2019-09-04 DIAGNOSIS — Z7901 Long term (current) use of anticoagulants: Secondary | ICD-10-CM | POA: Diagnosis not present

## 2019-09-06 DIAGNOSIS — G8929 Other chronic pain: Secondary | ICD-10-CM | POA: Diagnosis not present

## 2019-09-06 DIAGNOSIS — M5442 Lumbago with sciatica, left side: Secondary | ICD-10-CM | POA: Diagnosis not present

## 2019-09-06 DIAGNOSIS — M48062 Spinal stenosis, lumbar region with neurogenic claudication: Secondary | ICD-10-CM | POA: Diagnosis not present

## 2019-09-06 DIAGNOSIS — M6281 Muscle weakness (generalized): Secondary | ICD-10-CM | POA: Diagnosis not present

## 2019-09-06 DIAGNOSIS — M5441 Lumbago with sciatica, right side: Secondary | ICD-10-CM | POA: Diagnosis not present

## 2019-09-11 DIAGNOSIS — M5441 Lumbago with sciatica, right side: Secondary | ICD-10-CM | POA: Diagnosis not present

## 2019-09-11 DIAGNOSIS — M5442 Lumbago with sciatica, left side: Secondary | ICD-10-CM | POA: Diagnosis not present

## 2019-09-11 DIAGNOSIS — M48062 Spinal stenosis, lumbar region with neurogenic claudication: Secondary | ICD-10-CM | POA: Diagnosis not present

## 2019-09-11 DIAGNOSIS — G8929 Other chronic pain: Secondary | ICD-10-CM | POA: Diagnosis not present

## 2019-09-11 DIAGNOSIS — M6281 Muscle weakness (generalized): Secondary | ICD-10-CM | POA: Diagnosis not present

## 2019-09-13 DIAGNOSIS — M5441 Lumbago with sciatica, right side: Secondary | ICD-10-CM | POA: Diagnosis not present

## 2019-09-13 DIAGNOSIS — M6281 Muscle weakness (generalized): Secondary | ICD-10-CM | POA: Diagnosis not present

## 2019-09-13 DIAGNOSIS — G8929 Other chronic pain: Secondary | ICD-10-CM | POA: Diagnosis not present

## 2019-09-13 DIAGNOSIS — M5442 Lumbago with sciatica, left side: Secondary | ICD-10-CM | POA: Diagnosis not present

## 2019-09-13 DIAGNOSIS — M48062 Spinal stenosis, lumbar region with neurogenic claudication: Secondary | ICD-10-CM | POA: Diagnosis not present

## 2019-09-27 DIAGNOSIS — I2699 Other pulmonary embolism without acute cor pulmonale: Secondary | ICD-10-CM | POA: Diagnosis not present

## 2019-09-27 DIAGNOSIS — F17210 Nicotine dependence, cigarettes, uncomplicated: Secondary | ICD-10-CM | POA: Diagnosis not present

## 2019-09-27 DIAGNOSIS — G8929 Other chronic pain: Secondary | ICD-10-CM | POA: Diagnosis not present

## 2019-09-27 DIAGNOSIS — M1389 Other specified arthritis, multiple sites: Secondary | ICD-10-CM | POA: Diagnosis not present

## 2019-09-27 DIAGNOSIS — Z7901 Long term (current) use of anticoagulants: Secondary | ICD-10-CM | POA: Diagnosis not present

## 2019-09-27 DIAGNOSIS — R600 Localized edema: Secondary | ICD-10-CM | POA: Diagnosis not present

## 2019-09-27 DIAGNOSIS — I82409 Acute embolism and thrombosis of unspecified deep veins of unspecified lower extremity: Secondary | ICD-10-CM | POA: Diagnosis not present

## 2019-09-27 DIAGNOSIS — M545 Low back pain: Secondary | ICD-10-CM | POA: Diagnosis not present

## 2019-11-08 DIAGNOSIS — G4733 Obstructive sleep apnea (adult) (pediatric): Secondary | ICD-10-CM | POA: Diagnosis not present

## 2019-11-27 DIAGNOSIS — E7800 Pure hypercholesterolemia, unspecified: Secondary | ICD-10-CM | POA: Diagnosis not present

## 2019-11-27 DIAGNOSIS — E119 Type 2 diabetes mellitus without complications: Secondary | ICD-10-CM | POA: Diagnosis not present

## 2019-11-27 DIAGNOSIS — Z79899 Other long term (current) drug therapy: Secondary | ICD-10-CM | POA: Diagnosis not present

## 2019-11-27 DIAGNOSIS — Z7901 Long term (current) use of anticoagulants: Secondary | ICD-10-CM | POA: Diagnosis not present

## 2019-11-27 DIAGNOSIS — I10 Essential (primary) hypertension: Secondary | ICD-10-CM | POA: Diagnosis not present

## 2020-01-08 DIAGNOSIS — G4733 Obstructive sleep apnea (adult) (pediatric): Secondary | ICD-10-CM | POA: Diagnosis not present

## 2020-01-08 DIAGNOSIS — Z6841 Body Mass Index (BMI) 40.0 and over, adult: Secondary | ICD-10-CM | POA: Diagnosis not present

## 2020-01-08 DIAGNOSIS — K219 Gastro-esophageal reflux disease without esophagitis: Secondary | ICD-10-CM | POA: Diagnosis not present

## 2020-01-08 DIAGNOSIS — J449 Chronic obstructive pulmonary disease, unspecified: Secondary | ICD-10-CM | POA: Diagnosis not present

## 2020-01-08 DIAGNOSIS — I10 Essential (primary) hypertension: Secondary | ICD-10-CM | POA: Diagnosis not present

## 2020-01-08 DIAGNOSIS — M1A00X0 Idiopathic chronic gout, unspecified site, without tophus (tophi): Secondary | ICD-10-CM | POA: Diagnosis not present

## 2020-01-08 DIAGNOSIS — Z79899 Other long term (current) drug therapy: Secondary | ICD-10-CM | POA: Diagnosis not present

## 2020-01-08 DIAGNOSIS — E7800 Pure hypercholesterolemia, unspecified: Secondary | ICD-10-CM | POA: Diagnosis not present

## 2020-01-08 DIAGNOSIS — Z7901 Long term (current) use of anticoagulants: Secondary | ICD-10-CM | POA: Diagnosis not present

## 2020-01-08 DIAGNOSIS — F3341 Major depressive disorder, recurrent, in partial remission: Secondary | ICD-10-CM | POA: Diagnosis not present

## 2020-01-08 DIAGNOSIS — E1142 Type 2 diabetes mellitus with diabetic polyneuropathy: Secondary | ICD-10-CM | POA: Diagnosis not present

## 2020-01-15 DIAGNOSIS — M5126 Other intervertebral disc displacement, lumbar region: Secondary | ICD-10-CM | POA: Diagnosis not present

## 2020-01-15 DIAGNOSIS — M5416 Radiculopathy, lumbar region: Secondary | ICD-10-CM | POA: Diagnosis not present

## 2020-01-15 DIAGNOSIS — M48062 Spinal stenosis, lumbar region with neurogenic claudication: Secondary | ICD-10-CM | POA: Diagnosis not present

## 2020-01-15 DIAGNOSIS — M5136 Other intervertebral disc degeneration, lumbar region: Secondary | ICD-10-CM | POA: Diagnosis not present

## 2020-02-27 DIAGNOSIS — M5126 Other intervertebral disc displacement, lumbar region: Secondary | ICD-10-CM | POA: Diagnosis not present

## 2020-02-27 DIAGNOSIS — M5416 Radiculopathy, lumbar region: Secondary | ICD-10-CM | POA: Diagnosis not present

## 2020-02-27 DIAGNOSIS — M48062 Spinal stenosis, lumbar region with neurogenic claudication: Secondary | ICD-10-CM | POA: Diagnosis not present

## 2020-02-27 DIAGNOSIS — M5136 Other intervertebral disc degeneration, lumbar region: Secondary | ICD-10-CM | POA: Diagnosis not present

## 2020-02-28 DIAGNOSIS — Z7901 Long term (current) use of anticoagulants: Secondary | ICD-10-CM | POA: Diagnosis not present

## 2020-03-03 DIAGNOSIS — M62838 Other muscle spasm: Secondary | ICD-10-CM | POA: Diagnosis not present

## 2020-03-03 DIAGNOSIS — M5442 Lumbago with sciatica, left side: Secondary | ICD-10-CM | POA: Diagnosis not present

## 2020-03-03 DIAGNOSIS — M9903 Segmental and somatic dysfunction of lumbar region: Secondary | ICD-10-CM | POA: Diagnosis not present

## 2020-03-05 DIAGNOSIS — Z7901 Long term (current) use of anticoagulants: Secondary | ICD-10-CM | POA: Diagnosis not present

## 2020-03-06 DIAGNOSIS — M48062 Spinal stenosis, lumbar region with neurogenic claudication: Secondary | ICD-10-CM | POA: Diagnosis not present

## 2020-03-06 DIAGNOSIS — M5416 Radiculopathy, lumbar region: Secondary | ICD-10-CM | POA: Diagnosis not present

## 2020-03-06 DIAGNOSIS — M5136 Other intervertebral disc degeneration, lumbar region: Secondary | ICD-10-CM | POA: Diagnosis not present

## 2020-03-06 DIAGNOSIS — M5126 Other intervertebral disc displacement, lumbar region: Secondary | ICD-10-CM | POA: Diagnosis not present

## 2020-03-28 DIAGNOSIS — Z7901 Long term (current) use of anticoagulants: Secondary | ICD-10-CM | POA: Diagnosis not present

## 2020-04-08 DIAGNOSIS — R52 Pain, unspecified: Secondary | ICD-10-CM | POA: Diagnosis not present

## 2020-04-08 DIAGNOSIS — Z1329 Encounter for screening for other suspected endocrine disorder: Secondary | ICD-10-CM | POA: Diagnosis not present

## 2020-04-08 DIAGNOSIS — E119 Type 2 diabetes mellitus without complications: Secondary | ICD-10-CM | POA: Diagnosis not present

## 2020-04-08 DIAGNOSIS — E7800 Pure hypercholesterolemia, unspecified: Secondary | ICD-10-CM | POA: Diagnosis not present

## 2020-04-08 DIAGNOSIS — G629 Polyneuropathy, unspecified: Secondary | ICD-10-CM | POA: Diagnosis not present

## 2020-04-08 DIAGNOSIS — E538 Deficiency of other specified B group vitamins: Secondary | ICD-10-CM | POA: Diagnosis not present

## 2020-04-08 DIAGNOSIS — E559 Vitamin D deficiency, unspecified: Secondary | ICD-10-CM | POA: Diagnosis not present

## 2020-04-08 DIAGNOSIS — F17210 Nicotine dependence, cigarettes, uncomplicated: Secondary | ICD-10-CM | POA: Diagnosis not present

## 2020-04-08 DIAGNOSIS — Z86711 Personal history of pulmonary embolism: Secondary | ICD-10-CM | POA: Diagnosis not present

## 2020-04-15 DIAGNOSIS — M5416 Radiculopathy, lumbar region: Secondary | ICD-10-CM | POA: Diagnosis not present

## 2020-04-15 DIAGNOSIS — M5136 Other intervertebral disc degeneration, lumbar region: Secondary | ICD-10-CM | POA: Diagnosis not present

## 2020-05-15 ENCOUNTER — Other Ambulatory Visit: Payer: Self-pay

## 2020-05-15 ENCOUNTER — Ambulatory Visit: Payer: Medicare HMO | Admitting: Dermatology

## 2020-05-15 DIAGNOSIS — Z1283 Encounter for screening for malignant neoplasm of skin: Secondary | ICD-10-CM | POA: Diagnosis not present

## 2020-05-15 DIAGNOSIS — L578 Other skin changes due to chronic exposure to nonionizing radiation: Secondary | ICD-10-CM

## 2020-05-15 DIAGNOSIS — C4401 Basal cell carcinoma of skin of lip: Secondary | ICD-10-CM | POA: Diagnosis not present

## 2020-05-15 DIAGNOSIS — C44310 Basal cell carcinoma of skin of unspecified parts of face: Secondary | ICD-10-CM

## 2020-05-15 DIAGNOSIS — D229 Melanocytic nevi, unspecified: Secondary | ICD-10-CM

## 2020-05-15 DIAGNOSIS — L709 Acne, unspecified: Secondary | ICD-10-CM

## 2020-05-15 DIAGNOSIS — D485 Neoplasm of uncertain behavior of skin: Secondary | ICD-10-CM

## 2020-05-15 DIAGNOSIS — D489 Neoplasm of uncertain behavior, unspecified: Secondary | ICD-10-CM

## 2020-05-15 DIAGNOSIS — L820 Inflamed seborrheic keratosis: Secondary | ICD-10-CM

## 2020-05-15 DIAGNOSIS — L814 Other melanin hyperpigmentation: Secondary | ICD-10-CM

## 2020-05-15 DIAGNOSIS — Z85828 Personal history of other malignant neoplasm of skin: Secondary | ICD-10-CM

## 2020-05-15 DIAGNOSIS — D1801 Hemangioma of skin and subcutaneous tissue: Secondary | ICD-10-CM | POA: Diagnosis not present

## 2020-05-15 DIAGNOSIS — D492 Neoplasm of unspecified behavior of bone, soft tissue, and skin: Secondary | ICD-10-CM

## 2020-05-15 DIAGNOSIS — L821 Other seborrheic keratosis: Secondary | ICD-10-CM | POA: Diagnosis not present

## 2020-05-15 DIAGNOSIS — L918 Other hypertrophic disorders of the skin: Secondary | ICD-10-CM

## 2020-05-15 HISTORY — DX: Personal history of other malignant neoplasm of skin: Z85.828

## 2020-05-15 NOTE — Progress Notes (Signed)
Follow-Up Visit   Subjective  Adrian [name] is a 74 y.o. male who presents for the following: UBSE (has some white spots on b/l arms have been there for past few months, they seem to be scaly. Spot on Right lower lip for pas year or 2, seems to have dried up now but did bleed on occasion. Also has so concerns about his nose, seems to stay oily and has some pimples on it.). Patient has a history of [place] 10/22/15 R. Tragus with excision on 11/25/15, 03/06/15 L ear just sup to tragus with excision on 04/22/15, and 01/29/10 L nasolabial fold with Moh's History of dysplastic nevus 01/01/09 L arm. The patient presents for Upper Body Skin Exam (UBSE) for skin cancer screening and mole check.  The following portions of the chart were reviewed this encounter and updated as appropriate:  Tobacco  Allergies  Meds  Problems  Med Hx  Surg Hx  Fam Hx      Review of Systems:  No other skin or systemic complaints except as noted in HPI or Assessment and Plan.  Objective  Well appearing patient in no apparent distress; mood and affect are within normal limits.  All skin waist up examined.  Objective  Nose: Erythematous papules and pustules with sebaceous hyperplasia  Objective  Left Posterior base of Neck, R ear, left brow (3): Erythematous keratotic or waxy stuck-on papule or plaque.   Objective  Right Infra-ocular: 0.6 cm brown red papule  Objective  Right Lower lip/chin: 0.7 cm pink pearly papule  Assessment & Plan    Acne, comedonal with enlarged pores Nose  Recommend OTC Differin gel  apply thin coat to nose QHS  Inflamed seborrheic keratosis (3) Left Posterior base of Neck, R ear, left brow  cryotherapy today Prior to procedure, discussed risks of blister formation, small wound, skin dyspigmentation, or rare scar following cryotherapy.    Destruction of lesion - Left Posterior base of Neck, R ear, left brow Complexity: simple   Destruction method: cryotherapy   Informed  consent: discussed and consent obtained   Timeout:  patient name, date of birth, surgical site, and procedure verified Lesion destroyed using liquid nitrogen: Yes   Region frozen until ice ball extended beyond lesion: Yes   Outcome: patient tolerated procedure well with no complications   Post-procedure details: wound care instructions given    Neoplasm of skin Right Infra-ocular  Plan Shave removal at follow up in 2 months  Neoplasm of uncertain behavior Right Lower lip/chin  Epidermal / dermal shaving  Lesion length (cm):  0.7 Lesion width (cm):  0.7 Margin per side (cm):  0.2 Total excision diameter (cm):  1.1 Informed consent: discussed and consent obtained   Timeout: patient name, date of birth, surgical site, and procedure verified   Procedure prep:  Patient was prepped and draped in usual sterile fashion Prep type:  Isopropyl alcohol Anesthesia: the lesion was anesthetized in a standard fashion   Anesthetic:  1% lidocaine w/ epinephrine 1-100,000 buffered w/ 8.4% NaHCO3 Instrument used: flexible razor blade   Hemostasis achieved with: pressure, aluminum chloride and electrodesiccation   Outcome: patient tolerated procedure well   Post-procedure details: sterile dressing applied and wound care instructions given   Dressing type: bandage and petrolatum    Destruction of lesion Complexity: extensive   Destruction method: electrodesiccation and curettage   Informed consent: discussed and consent obtained   Timeout:  patient name, date of birth, surgical site, and procedure verified Procedure prep:  Patient was prepped and draped in usual sterile fashion Prep type:  Isopropyl alcohol Anesthesia: the lesion was anesthetized in a standard fashion   Anesthetic:  1% lidocaine w/ epinephrine 1-100,000 buffered w/ 8.4% NaHCO3 Curettage performed in three different directions: Yes   Electrodesiccation performed over the curetted area: Yes   Lesion length (cm):  0.7 Lesion  width (cm):  0.7 Margin per side (cm):  0.2 Final wound size (cm):  1.1 Hemostasis achieved with:  pressure, aluminum chloride and electrodesiccation Outcome: patient tolerated procedure well with no complications   Post-procedure details: sterile dressing applied and wound care instructions given   Dressing type: bandage and petrolatum    Specimen 1 - Surgical pathology Differential Diagnosis: R/O BCC vs other Check Margins: No 0.7 cm pink pearly papule  Shave removal with ED & C today  Skin cancer screening  Lentigines - Scattered tan macules - Discussed due to sun exposure - Benign, observe - Call for any changes  Seborrheic Keratoses - Stuck-on, waxy, tan-brown papules and plaques  - Discussed benign etiology and prognosis. - Observe - Call for any changes  Melanocytic Nevi - Tan-brown and/or pink-flesh-colored symmetric macules and papules - Benign appearing on exam today - Observation - Call clinic for new or changing moles - Recommend daily use of broad spectrum spf 30+ sunscreen to sun-exposed areas.   Hemangiomas - Red papules - Discussed benign nature - Observe - Call for any changes  Actinic Damage - diffuse scaly erythematous macules with underlying dyspigmentation - Recommend daily broad spectrum sunscreen SPF 30+ to sun-exposed areas, reapply every 2 hours as needed.  - Call for new or changing lesions.  Acrochordons (Skin Tags) - Fleshy, skin-colored pedunculated papules - Benign appearing.  - Observe. - If desired, they can be removed with an in office procedure that is not covered by insurance. - Please call the clinic if you notice any new or changing lesions.  Skin cancer screening performed today.   Return in about 2 months (around 07/15/2020).  Marene Lenz, CMA, am acting as scribe for Sarina Ser, MD . Documentation: I have reviewed the above documentation for accuracy and completeness, and I agree with the above.  Sarina Ser, MD

## 2020-05-15 NOTE — Patient Instructions (Addendum)
Recommend daily broad spectrum sunscreen SPF 30+ to sun-exposed areas, reapply every 2 hours as needed. Call for new or changing lesions.  Wound Care Instructions  1. Cleanse wound gently with soap and water once a day then pat dry with clean gauze. Apply a thing coat of Petrolatum (petroleum jelly, "Vaseline") over the wound (unless you have an allergy to this). We recommend that you use a new, sterile tube of Vaseline. Do not pick or remove scabs. Do not remove the yellow or white "healing tissue" from the base of the wound.  2. Cover the wound with fresh, clean, nonstick gauze and secure with paper tape. You may use Band-Aids in place of gauze and tape if the would is small enough, but would recommend trimming much of the tape off as there is often too much. Sometimes Band-Aids can irritate the skin.  3. You should call the office for your biopsy report after 1 week if you have not already been contacted.  4. If you experience any problems, such as abnormal amounts of bleeding, swelling, significant bruising, significant pain, or evidence of infection, please call the office immediately.  5. FOR ADULT SURGERY PATIENTS: If you need something for pain relief you may take 1 extra strength Tylenol (acetaminophen) AND 2 Ibuprofen (200mg  each) together every 4 hours as needed for pain. (do not take these if you are allergic to them or if you have a reason you should not take them.) Typically, you may only need pain medication for 1 to 3 days.    Prior to procedure, discussed risks of blister formation, small wound, skin dyspigmentation, or rare scar following cryotherapy.   Cryotherapy Aftercare  . Wash gently with soap and water everyday.   Marland Kitchen Apply Vaseline and Band-Aid daily until healed. Marland Kitchen

## 2020-05-21 DIAGNOSIS — Z7901 Long term (current) use of anticoagulants: Secondary | ICD-10-CM | POA: Diagnosis not present

## 2020-05-22 DIAGNOSIS — M5126 Other intervertebral disc displacement, lumbar region: Secondary | ICD-10-CM | POA: Diagnosis not present

## 2020-05-22 DIAGNOSIS — M5416 Radiculopathy, lumbar region: Secondary | ICD-10-CM | POA: Diagnosis not present

## 2020-05-22 DIAGNOSIS — M48062 Spinal stenosis, lumbar region with neurogenic claudication: Secondary | ICD-10-CM | POA: Diagnosis not present

## 2020-05-23 ENCOUNTER — Encounter: Payer: Self-pay | Admitting: Dermatology

## 2020-05-27 ENCOUNTER — Telehealth: Payer: Self-pay

## 2020-05-27 NOTE — Telephone Encounter (Signed)
LM on VM to return my call. 

## 2020-05-27 NOTE — Telephone Encounter (Signed)
-----   Message from Ralene Bathe, MD sent at 05/23/2020  2:51 PM EDT ----- Skin , right lower lip/chin BASAL CELL CARCINOMA, NODULAR PATTERN  Cancer - BCC Already treated Recheck as scheduled in 2 mos

## 2020-05-27 NOTE — Telephone Encounter (Signed)
Discussed biopsy results with pt  °

## 2020-05-28 DIAGNOSIS — G4733 Obstructive sleep apnea (adult) (pediatric): Secondary | ICD-10-CM | POA: Diagnosis not present

## 2020-06-05 DIAGNOSIS — M48062 Spinal stenosis, lumbar region with neurogenic claudication: Secondary | ICD-10-CM | POA: Diagnosis not present

## 2020-06-19 DIAGNOSIS — Z5181 Encounter for therapeutic drug level monitoring: Secondary | ICD-10-CM | POA: Diagnosis not present

## 2020-06-19 DIAGNOSIS — M5116 Intervertebral disc disorders with radiculopathy, lumbar region: Secondary | ICD-10-CM | POA: Diagnosis not present

## 2020-06-19 DIAGNOSIS — M5416 Radiculopathy, lumbar region: Secondary | ICD-10-CM | POA: Diagnosis not present

## 2020-06-27 DIAGNOSIS — G4733 Obstructive sleep apnea (adult) (pediatric): Secondary | ICD-10-CM | POA: Diagnosis not present

## 2020-07-03 DIAGNOSIS — M25562 Pain in left knee: Secondary | ICD-10-CM | POA: Diagnosis not present

## 2020-07-03 DIAGNOSIS — M25561 Pain in right knee: Secondary | ICD-10-CM | POA: Diagnosis not present

## 2020-07-17 ENCOUNTER — Ambulatory Visit: Payer: Medicare HMO | Admitting: Dermatology

## 2020-07-28 DIAGNOSIS — G4733 Obstructive sleep apnea (adult) (pediatric): Secondary | ICD-10-CM | POA: Diagnosis not present

## 2020-07-31 DIAGNOSIS — M5136 Other intervertebral disc degeneration, lumbar region: Secondary | ICD-10-CM | POA: Diagnosis not present

## 2020-07-31 DIAGNOSIS — M170 Bilateral primary osteoarthritis of knee: Secondary | ICD-10-CM | POA: Diagnosis not present

## 2020-08-18 DIAGNOSIS — Z7901 Long term (current) use of anticoagulants: Secondary | ICD-10-CM | POA: Diagnosis not present

## 2020-08-28 DIAGNOSIS — G4733 Obstructive sleep apnea (adult) (pediatric): Secondary | ICD-10-CM | POA: Diagnosis not present

## 2020-09-03 IMAGING — XA DG HIP (WITH PELVIS) OPERATIVE*R*
3 series · 11 of 11 positions shown · non-contrast
Comparison: 09/06/2018

CLINICAL DATA: Right total hip replacement

EXAM:
OPERATIVE RIGHT HIP (WITH PELVIS IF PERFORMED) 3 VIEWS
TECHNIQUE: Fluoroscopic spot image(s) were submitted for interpretation
post-operatively.

[Series 1: ortho standard · 4 of 13 frames shown (1 of 3)]
[frame 2/13]
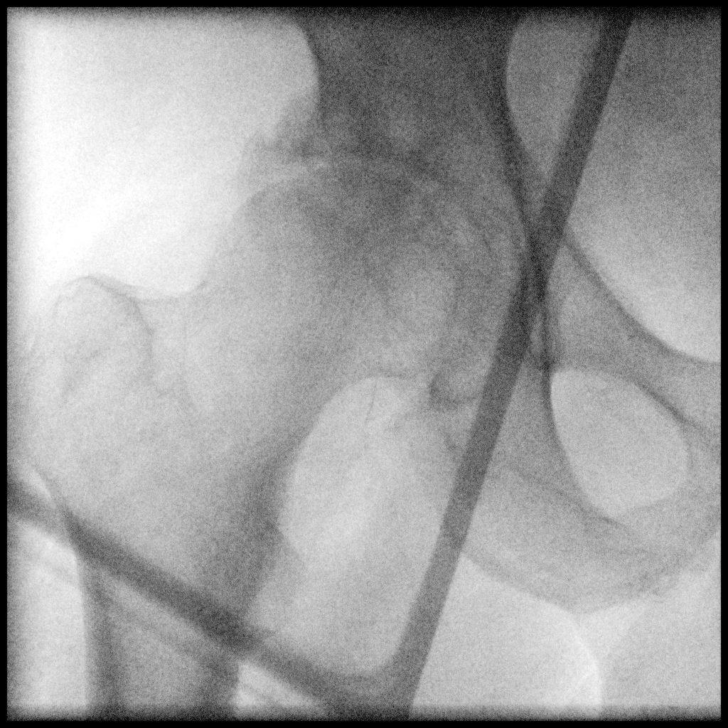
[frame 7/13]
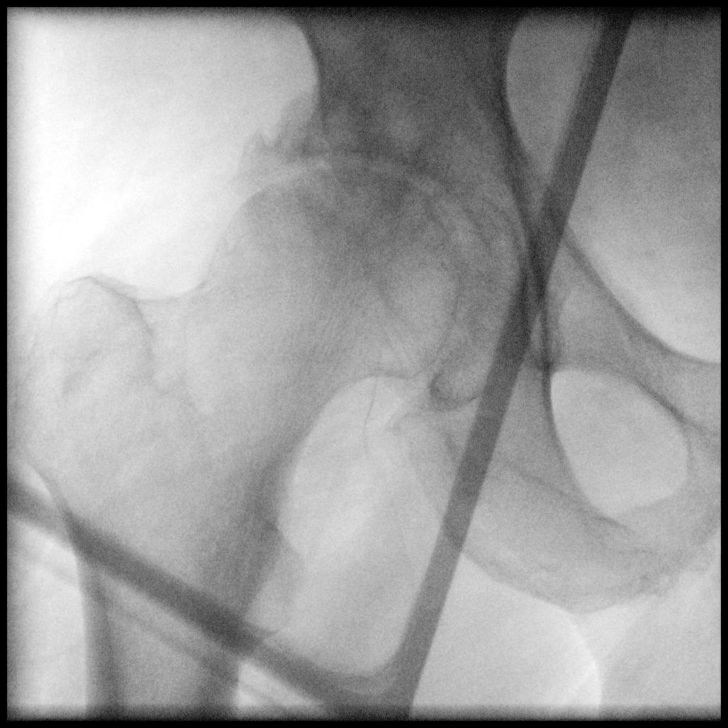
[frame 12/13]
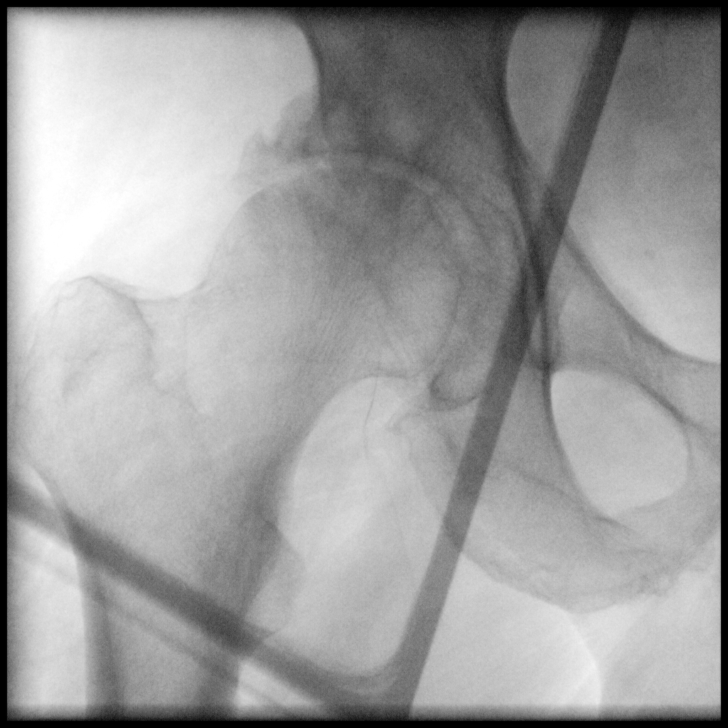
[frame 13/13]
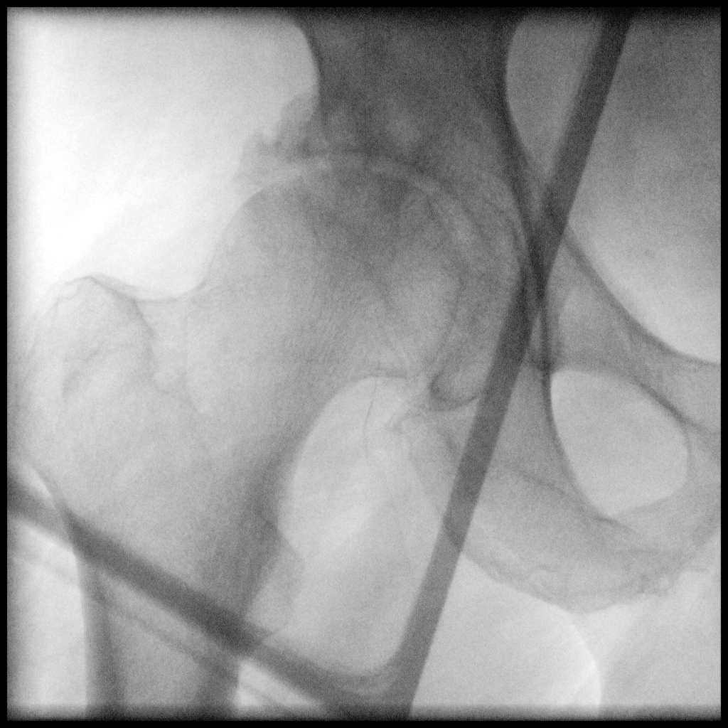

[Series 2: ortho standard · 3 of 13 frames shown (2 of 3)]
[frame 2/13]
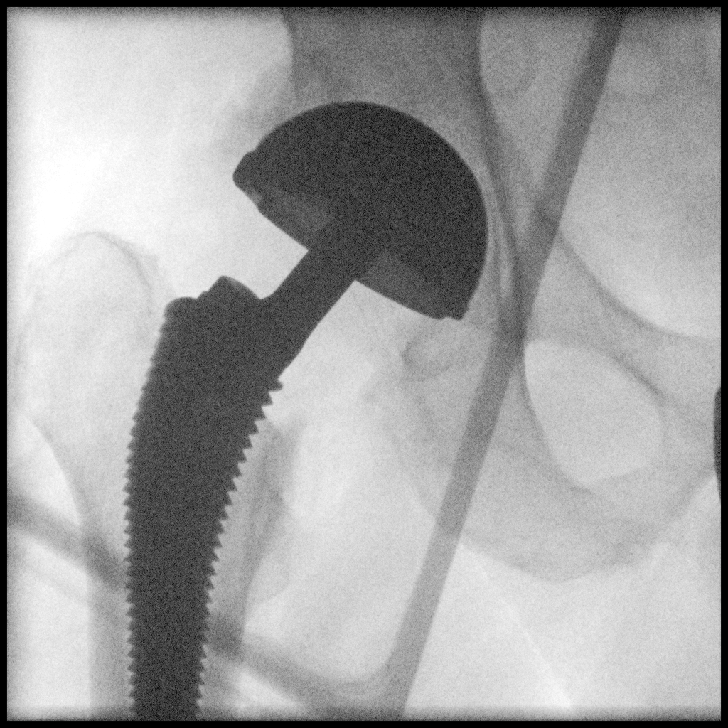
[frame 7/13]
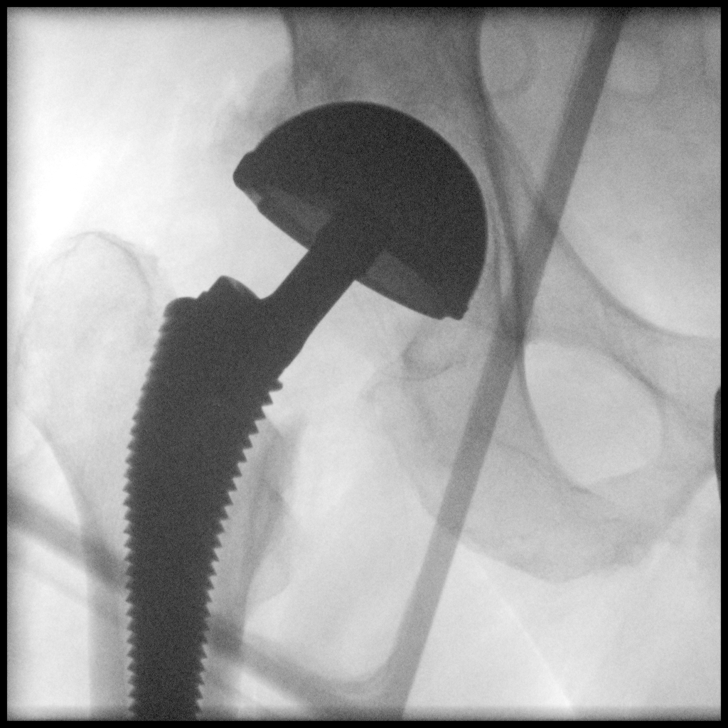
[frame 12/13]
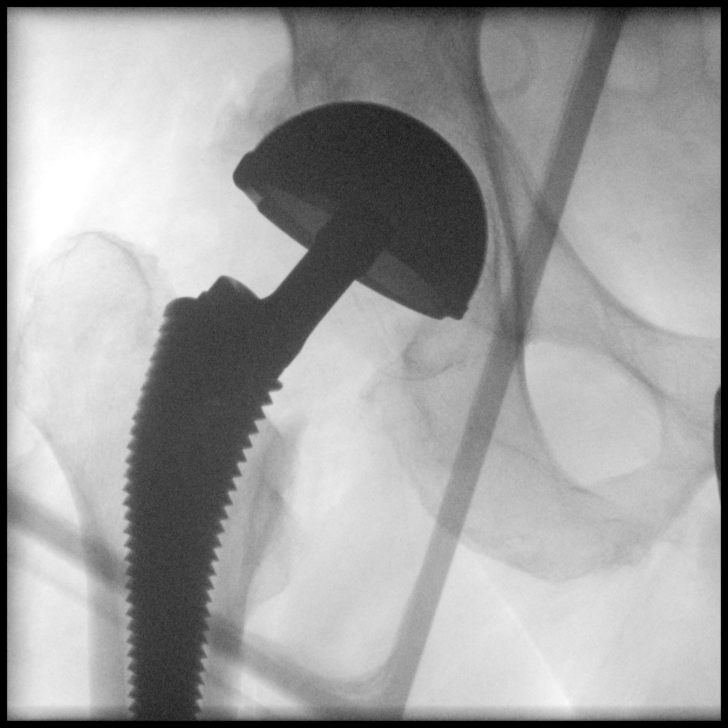

[Series 3: ortho standard · 4 of 15 frames shown (3 of 3)]
[frame 3/15]
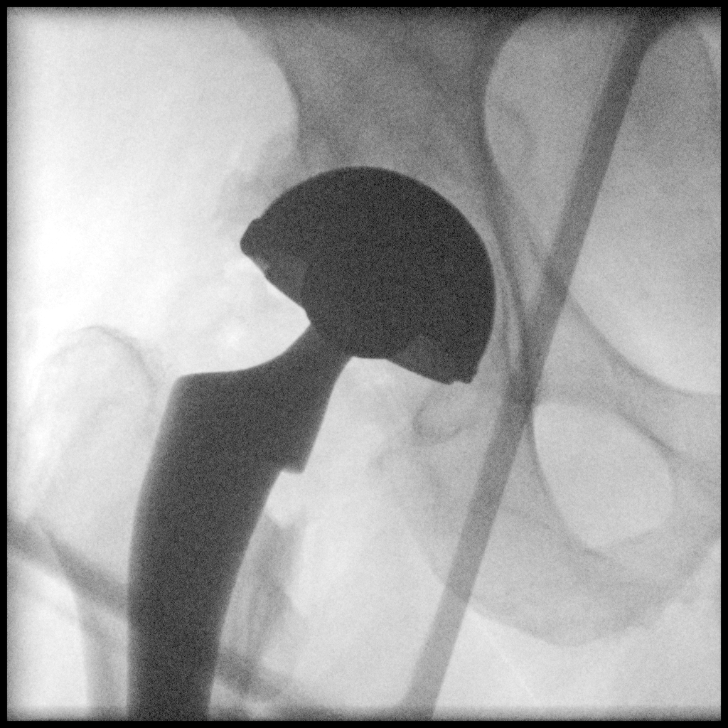
[frame 8/15]
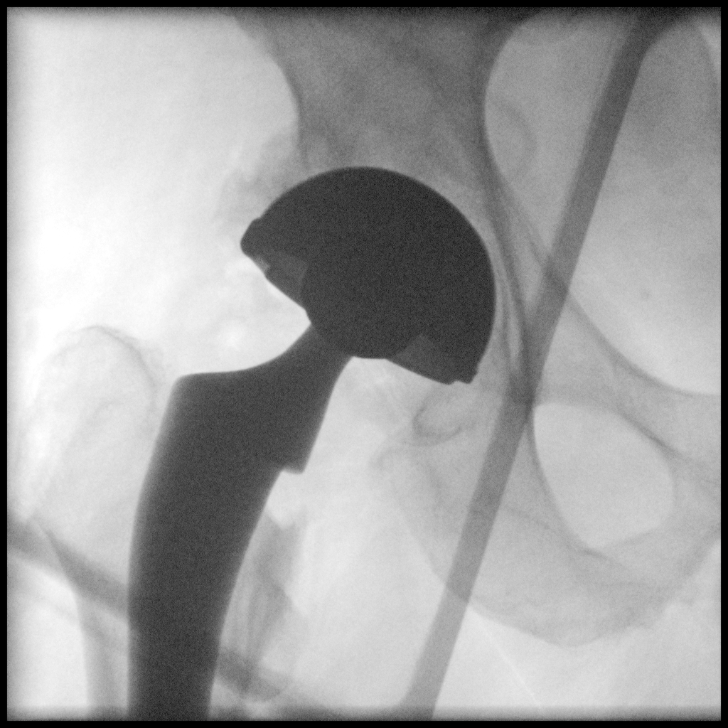
[frame 13/15]
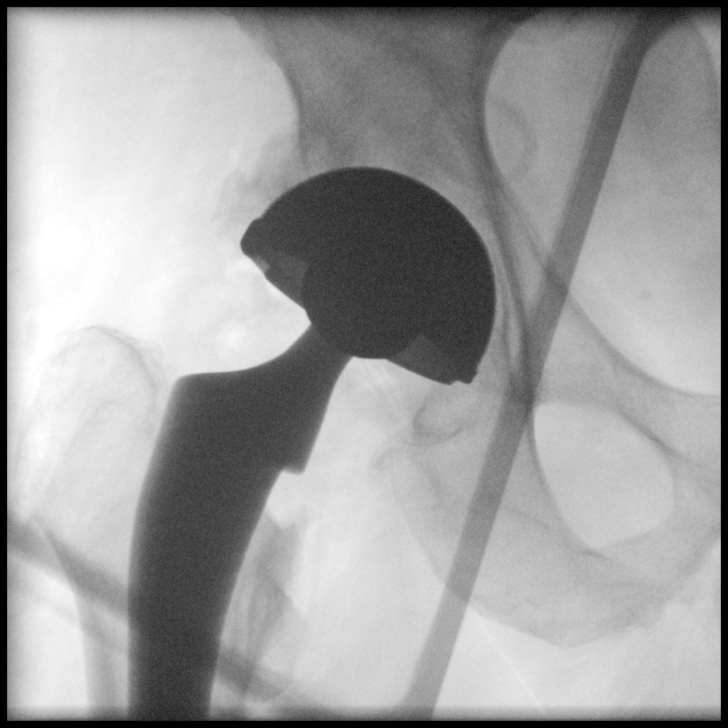
[frame 14/15]
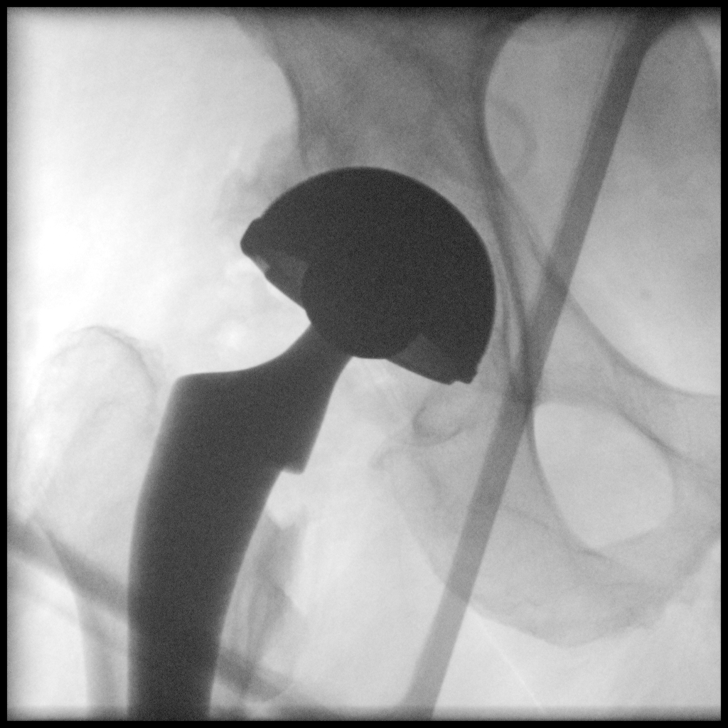

[11 of 11 positions shown; findings below may reference images not displayed]

FINDINGS: Intraoperative spot images demonstrate right hip replacement
changes. No visible hardware or bony complicating feature. Normal AP
alignment.
IMPRESSION: Right hip replacement.  No visible complicating feature.

## 2020-09-03 IMAGING — DX DG HIP (WITH OR WITHOUT PELVIS) 2-3V*R*
2 series · 2 of 2 positions shown · non-contrast
Comparison: Intraoperative RIGHT hip x-rays earlier same day,
08/27/2018 and earlier.

CLINICAL DATA: Postop day 0 ANTERIOR approach RIGHT total hip
arthroplasty.

EXAM:
DG HIP (WITH OR WITHOUT PELVIS) 2-3V RIGHT

[hip ap]
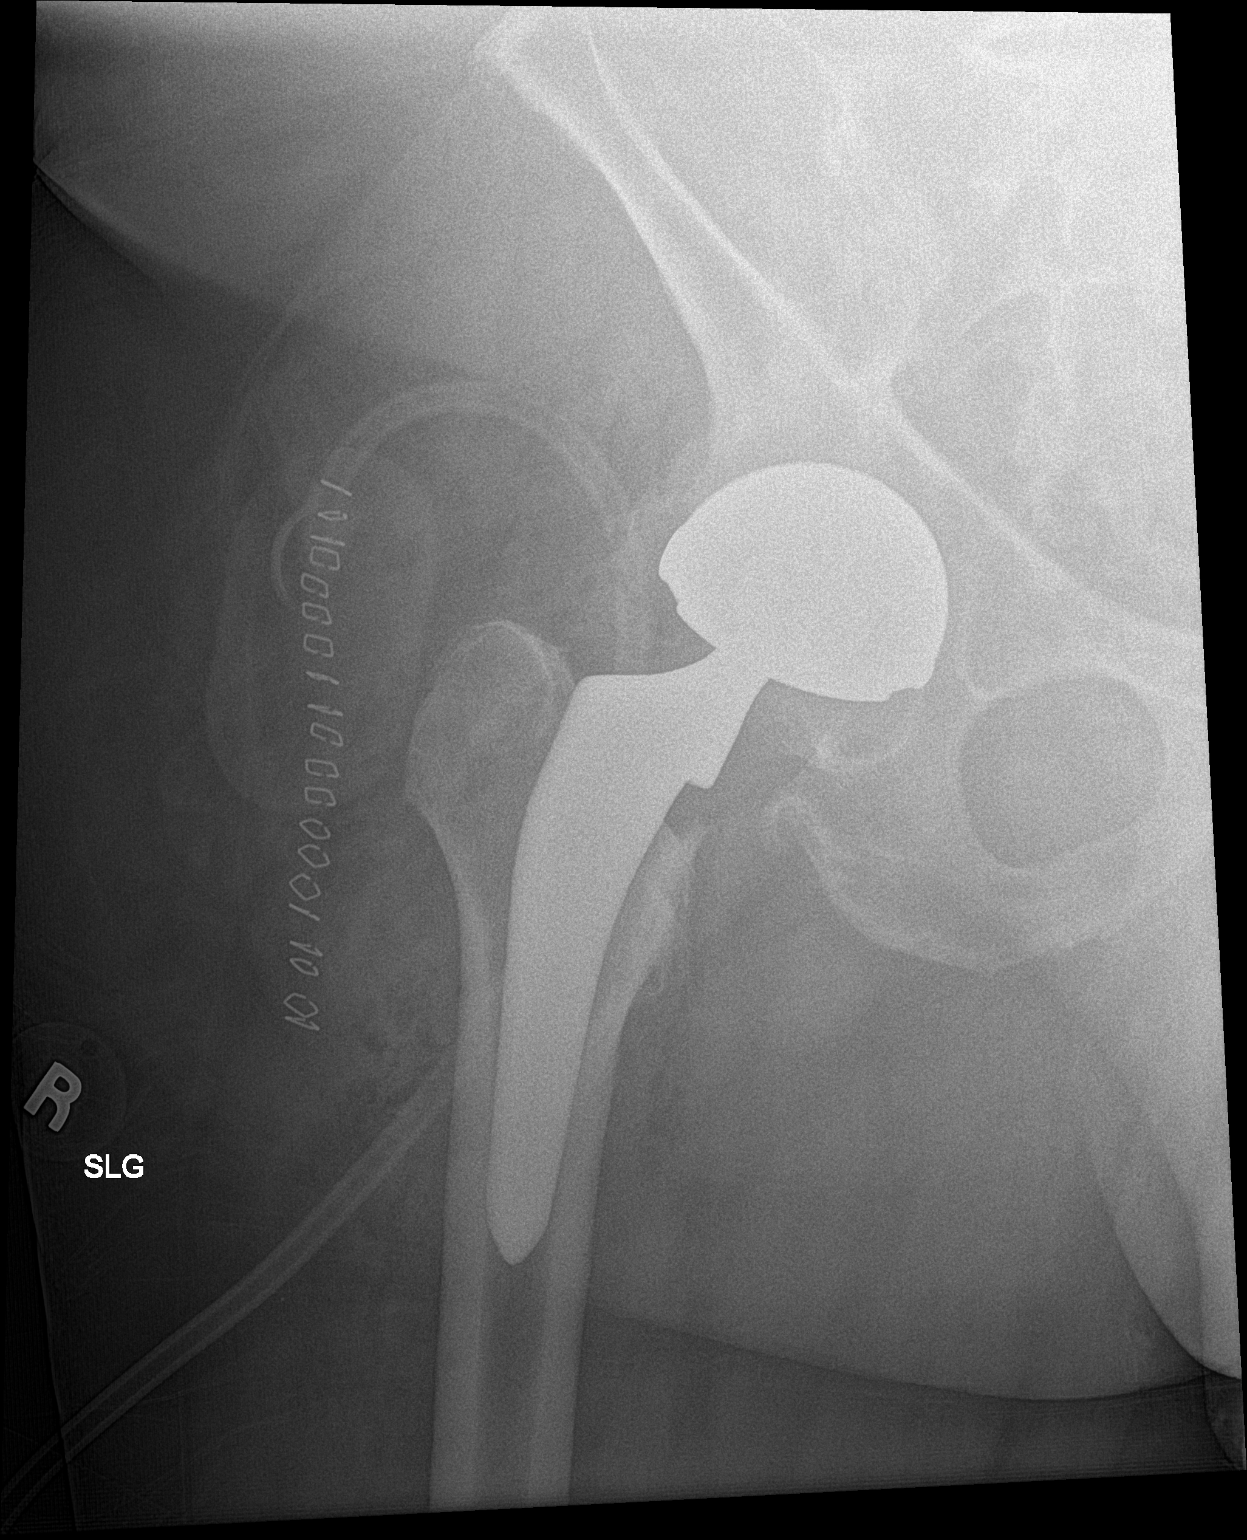

[hip lat]
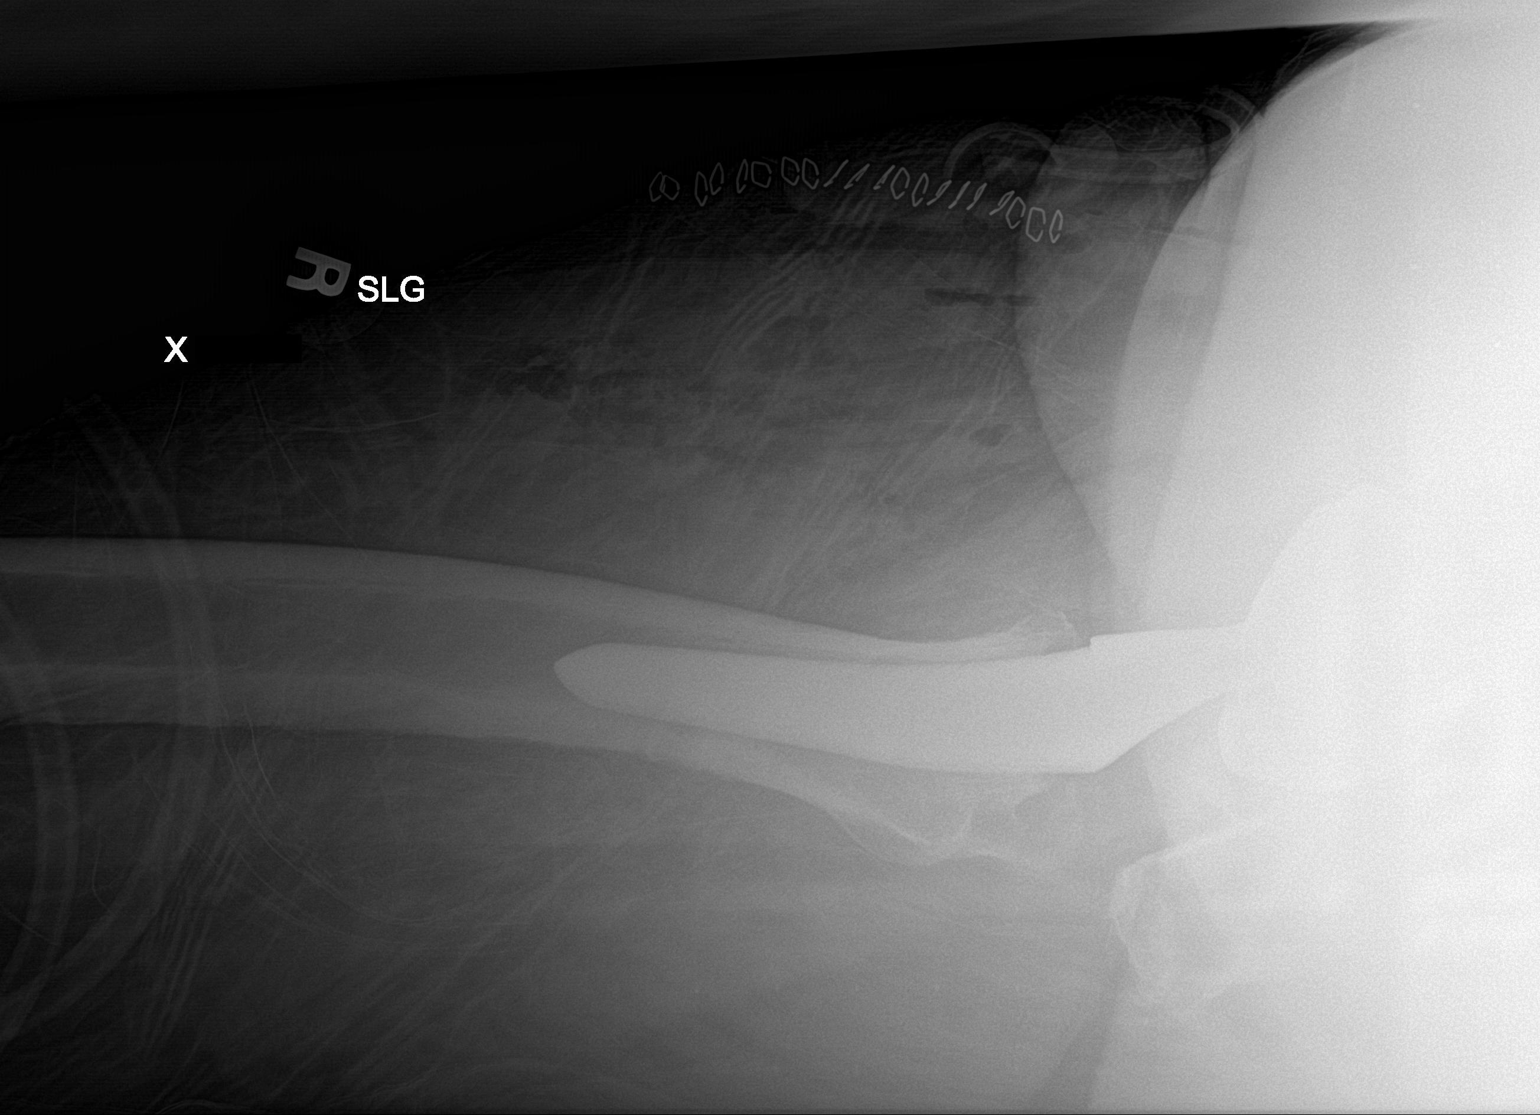

[2 of 2 positions shown; findings below may reference images not displayed]

FINDINGS: Anatomic alignment of the RIGHT hip prosthesis post RIGHT total hip
arthroplasty. Surgical drain in place. No acute complicating
features. Protrusio deformity of the acetabulum as noted previously.
IMPRESSION: Anatomic alignment of the RIGHT total hip arthroplasty without acute
complicating features.

## 2020-09-04 DIAGNOSIS — M5442 Lumbago with sciatica, left side: Secondary | ICD-10-CM | POA: Diagnosis not present

## 2020-09-04 DIAGNOSIS — Z79899 Other long term (current) drug therapy: Secondary | ICD-10-CM | POA: Diagnosis not present

## 2020-09-04 DIAGNOSIS — M5136 Other intervertebral disc degeneration, lumbar region: Secondary | ICD-10-CM | POA: Diagnosis not present

## 2020-09-04 DIAGNOSIS — Z7901 Long term (current) use of anticoagulants: Secondary | ICD-10-CM | POA: Diagnosis not present

## 2020-09-04 DIAGNOSIS — M5416 Radiculopathy, lumbar region: Secondary | ICD-10-CM | POA: Diagnosis not present

## 2020-09-04 DIAGNOSIS — M62830 Muscle spasm of back: Secondary | ICD-10-CM | POA: Diagnosis not present

## 2020-09-04 DIAGNOSIS — Z79891 Long term (current) use of opiate analgesic: Secondary | ICD-10-CM | POA: Diagnosis not present

## 2020-09-04 DIAGNOSIS — M48062 Spinal stenosis, lumbar region with neurogenic claudication: Secondary | ICD-10-CM | POA: Diagnosis not present

## 2020-09-04 DIAGNOSIS — M5126 Other intervertebral disc displacement, lumbar region: Secondary | ICD-10-CM | POA: Diagnosis not present

## 2020-09-16 ENCOUNTER — Telehealth: Payer: Self-pay

## 2020-09-16 MED ORDER — ADAPALENE 0.3 % EX GEL: 1.0000 "application " | Freq: Every day | CUTANEOUS | 2 refills | Status: DC

## 2020-09-16 NOTE — Telephone Encounter (Signed)
Patient was here to see you back in May and you noted acne with enlarged pores on patients nose. He was to try OTC Differin Gel. He has used this with no improvement. He is asking can we send in prescription strength now?

## 2020-09-16 NOTE — Telephone Encounter (Signed)
RX sent in and left msg for pt to return our call.  

## 2020-09-16 NOTE — Telephone Encounter (Signed)
May send Adapalene 0.3% IF not covered or too expensive, send Tretinoin 0.025% cr Use qhs face/nose

## 2020-09-17 NOTE — Telephone Encounter (Signed)
Patient advised of information.

## 2020-09-23 MED ORDER — TRETINOIN 0.025 % EX CREA: TOPICAL_CREAM | Freq: Every evening | CUTANEOUS | 2 refills | Status: AC

## 2020-09-23 NOTE — Addendum Note (Signed)
Addended by: Johnsie Kindred R on: 09/23/2020 12:28 PM   Modules accepted: Orders

## 2020-09-23 NOTE — Telephone Encounter (Signed)
Adapalene 0.3% is covered by insurance but does give patient a $90 copay. Tretinoin sent in today and patient advised to call back if too expensive.

## 2020-09-25 DIAGNOSIS — G4733 Obstructive sleep apnea (adult) (pediatric): Secondary | ICD-10-CM | POA: Diagnosis not present

## 2020-09-25 DIAGNOSIS — E1142 Type 2 diabetes mellitus with diabetic polyneuropathy: Secondary | ICD-10-CM | POA: Diagnosis not present

## 2020-09-25 DIAGNOSIS — K219 Gastro-esophageal reflux disease without esophagitis: Secondary | ICD-10-CM | POA: Diagnosis not present

## 2020-09-25 DIAGNOSIS — Z23 Encounter for immunization: Secondary | ICD-10-CM | POA: Diagnosis not present

## 2020-09-25 DIAGNOSIS — F3341 Major depressive disorder, recurrent, in partial remission: Secondary | ICD-10-CM | POA: Diagnosis not present

## 2020-09-25 DIAGNOSIS — E7800 Pure hypercholesterolemia, unspecified: Secondary | ICD-10-CM | POA: Diagnosis not present

## 2020-09-25 DIAGNOSIS — M129 Arthropathy, unspecified: Secondary | ICD-10-CM | POA: Diagnosis not present

## 2020-09-25 DIAGNOSIS — J449 Chronic obstructive pulmonary disease, unspecified: Secondary | ICD-10-CM | POA: Diagnosis not present

## 2020-09-25 DIAGNOSIS — Z0000 Encounter for general adult medical examination without abnormal findings: Secondary | ICD-10-CM | POA: Diagnosis not present

## 2020-09-25 DIAGNOSIS — Z6839 Body mass index (BMI) 39.0-39.9, adult: Secondary | ICD-10-CM | POA: Diagnosis not present

## 2020-09-25 DIAGNOSIS — E119 Type 2 diabetes mellitus without complications: Secondary | ICD-10-CM | POA: Diagnosis not present

## 2020-09-27 DIAGNOSIS — G4733 Obstructive sleep apnea (adult) (pediatric): Secondary | ICD-10-CM | POA: Diagnosis not present

## 2020-10-01 DIAGNOSIS — Z7901 Long term (current) use of anticoagulants: Secondary | ICD-10-CM | POA: Diagnosis not present

## 2020-10-07 ENCOUNTER — Ambulatory Visit: Payer: Medicare HMO | Admitting: Dermatology

## 2020-10-21 DIAGNOSIS — R42 Dizziness and giddiness: Secondary | ICD-10-CM | POA: Diagnosis not present

## 2020-10-21 DIAGNOSIS — Z7901 Long term (current) use of anticoagulants: Secondary | ICD-10-CM | POA: Diagnosis not present

## 2020-10-27 DIAGNOSIS — Z7901 Long term (current) use of anticoagulants: Secondary | ICD-10-CM | POA: Diagnosis not present

## 2020-10-28 DIAGNOSIS — M48062 Spinal stenosis, lumbar region with neurogenic claudication: Secondary | ICD-10-CM | POA: Diagnosis not present

## 2020-10-28 DIAGNOSIS — M5416 Radiculopathy, lumbar region: Secondary | ICD-10-CM | POA: Diagnosis not present

## 2020-10-28 DIAGNOSIS — G4733 Obstructive sleep apnea (adult) (pediatric): Secondary | ICD-10-CM | POA: Diagnosis not present

## 2020-10-28 DIAGNOSIS — M5136 Other intervertebral disc degeneration, lumbar region: Secondary | ICD-10-CM | POA: Diagnosis not present

## 2020-11-21 DIAGNOSIS — Z7901 Long term (current) use of anticoagulants: Secondary | ICD-10-CM | POA: Diagnosis not present

## 2020-11-27 DIAGNOSIS — G4733 Obstructive sleep apnea (adult) (pediatric): Secondary | ICD-10-CM | POA: Diagnosis not present

## 2020-12-25 ENCOUNTER — Ambulatory Visit (INDEPENDENT_AMBULATORY_CARE_PROVIDER_SITE_OTHER): Payer: Medicare HMO | Admitting: Dermatology

## 2020-12-25 ENCOUNTER — Encounter: Payer: Self-pay | Admitting: Dermatology

## 2020-12-25 ENCOUNTER — Other Ambulatory Visit: Payer: Self-pay

## 2020-12-25 DIAGNOSIS — D485 Neoplasm of uncertain behavior of skin: Secondary | ICD-10-CM | POA: Diagnosis not present

## 2020-12-25 DIAGNOSIS — L578 Other skin changes due to chronic exposure to nonionizing radiation: Secondary | ICD-10-CM

## 2020-12-25 DIAGNOSIS — L820 Inflamed seborrheic keratosis: Secondary | ICD-10-CM

## 2020-12-25 DIAGNOSIS — L918 Other hypertrophic disorders of the skin: Secondary | ICD-10-CM

## 2020-12-25 DIAGNOSIS — Z1283 Encounter for screening for malignant neoplasm of skin: Secondary | ICD-10-CM

## 2020-12-25 DIAGNOSIS — Z85828 Personal history of other malignant neoplasm of skin: Secondary | ICD-10-CM | POA: Diagnosis not present

## 2020-12-25 DIAGNOSIS — Z7901 Long term (current) use of anticoagulants: Secondary | ICD-10-CM | POA: Diagnosis not present

## 2020-12-25 NOTE — Progress Notes (Signed)
Follow-Up Visit   Subjective  Adrian [name] is a 75 y.o. male who presents for the following: recheck BCC site (R lower lip/chin S/P ED&C - check for recurrence today), Skin Tag (Around the neck that are irritated, patient would like them removed today), and Lesion (R infraocular - irregular appearing, will not resolve).  The following portions of the chart were reviewed this encounter and updated as appropriate:   Tobacco  Allergies  Meds  Problems  Med Hx  Surg Hx  Fam Hx     Review of Systems:  No other skin or systemic complaints except as noted in HPI or Assessment and Plan.  Objective  Well appearing patient in no apparent distress; mood and affect are within normal limits.  A full examination was performed including scalp, head, eyes, ears, nose, lips, neck, chest, axillae, abdomen, back, buttocks, bilateral upper extremities, bilateral lower extremities, hands, feet, fingers, toes, fingernails, and toenails. All findings within normal limits unless otherwise noted below.  Objective  R lower lip/chin: Healing ED&C site.  Objective  Chest and abdomen x 16 (3): Erythematous keratotic or waxy stuck-on papule or plaque.    Assessment & Plan  History of basal cell carcinoma (BCC) R lower lip/chin Clear. Observe for recurrence. Call clinic for new or changing lesions.  Recommend regular skin exams, daily broad-spectrum spf 30+ sunscreen use, and photoprotection.     Inflamed seborrheic keratosis (3) Chest and abdomen x 16  Destruction of lesion - Chest and abdomen x 16 Complexity: simple   Destruction method: cryotherapy   Informed consent: discussed and consent obtained   Timeout:  patient name, date of birth, surgical site, and procedure verified Lesion destroyed using liquid nitrogen: Yes   Region frozen until ice ball extended beyond lesion: Yes   Outcome: patient tolerated procedure well with no complications   Post-procedure details: wound care instructions  given    Neoplasm of uncertain behavior of skin R infraocular Improved at today's visit - pt declines procedure on this area today.  Will re-evaluate at follow up appointment in three months.  If not improved or resolved - plan shave removal or other procedure at that time.  Skin cancer screening  Actinic Damage - chronic, secondary to cumulative UV radiation exposure/sun exposure over time - diffuse scaly erythematous macules with underlying dyspigmentation - Recommend daily broad spectrum sunscreen SPF 30+ to sun-exposed areas, reapply every 2 hours as needed.  - Call for new or changing lesions.  Acrochordons (Skin Tags) - Removal desired by patient due to irritation/rubbing - Fleshy, skin-colored pedunculated papules - Benign appearing.  - Prior to the procedure, reviewed the expected small wound. Also reviewed the risk of leaving a small scar and the small risk of infection.  PROCEDURE - The areas were prepped with isopropyl alcohol. A small amount of lidocaine 1% with epinephrine was injected at the base of each lesion to achieve good local anesthesia. The skin tags were removed using a snip technique. Aluminum chloride was used for hemostasis. Petrolatum and a bandage were applied. The procedure was tolerated well. - Wound care was reviewed with the patient. They were advised to call with any concerns. Total number of treated acrochordons 7.  Return in about 3 months (around 03/25/2021) for possible shave removal .  I, Cari Caraway, CMA, am acting as scribe for Armida Sans, MD .  Documentation: I have reviewed the above documentation for accuracy and completeness, and I agree with the above.  Armida Sans, MD

## 2020-12-26 ENCOUNTER — Encounter: Payer: Self-pay | Admitting: Dermatology

## 2020-12-28 DIAGNOSIS — G4733 Obstructive sleep apnea (adult) (pediatric): Secondary | ICD-10-CM | POA: Diagnosis not present

## 2021-01-01 DIAGNOSIS — M5416 Radiculopathy, lumbar region: Secondary | ICD-10-CM | POA: Diagnosis not present

## 2021-01-08 DIAGNOSIS — Z7901 Long term (current) use of anticoagulants: Secondary | ICD-10-CM | POA: Diagnosis not present

## 2021-01-22 DIAGNOSIS — R791 Abnormal coagulation profile: Secondary | ICD-10-CM | POA: Diagnosis not present

## 2021-01-28 DIAGNOSIS — G4733 Obstructive sleep apnea (adult) (pediatric): Secondary | ICD-10-CM | POA: Diagnosis not present

## 2021-01-29 DIAGNOSIS — H66001 Acute suppurative otitis media without spontaneous rupture of ear drum, right ear: Secondary | ICD-10-CM | POA: Diagnosis not present

## 2021-02-17 DIAGNOSIS — R0602 Shortness of breath: Secondary | ICD-10-CM | POA: Diagnosis not present

## 2021-02-17 DIAGNOSIS — E7800 Pure hypercholesterolemia, unspecified: Secondary | ICD-10-CM | POA: Diagnosis not present

## 2021-02-17 DIAGNOSIS — G4733 Obstructive sleep apnea (adult) (pediatric): Secondary | ICD-10-CM | POA: Diagnosis not present

## 2021-02-17 DIAGNOSIS — I4891 Unspecified atrial fibrillation: Secondary | ICD-10-CM | POA: Diagnosis not present

## 2021-02-17 DIAGNOSIS — Z9989 Dependence on other enabling machines and devices: Secondary | ICD-10-CM | POA: Diagnosis not present

## 2021-02-17 DIAGNOSIS — I10 Essential (primary) hypertension: Secondary | ICD-10-CM | POA: Diagnosis not present

## 2021-02-17 DIAGNOSIS — I4819 Other persistent atrial fibrillation: Secondary | ICD-10-CM | POA: Diagnosis not present

## 2021-02-17 DIAGNOSIS — Z6841 Body Mass Index (BMI) 40.0 and over, adult: Secondary | ICD-10-CM | POA: Diagnosis not present

## 2021-02-17 DIAGNOSIS — R5383 Other fatigue: Secondary | ICD-10-CM | POA: Diagnosis not present

## 2021-02-17 DIAGNOSIS — Z86711 Personal history of pulmonary embolism: Secondary | ICD-10-CM | POA: Diagnosis not present

## 2021-02-19 DIAGNOSIS — M791 Myalgia, unspecified site: Secondary | ICD-10-CM | POA: Diagnosis not present

## 2021-02-25 DIAGNOSIS — G4733 Obstructive sleep apnea (adult) (pediatric): Secondary | ICD-10-CM | POA: Diagnosis not present

## 2021-02-26 DIAGNOSIS — E7800 Pure hypercholesterolemia, unspecified: Secondary | ICD-10-CM | POA: Diagnosis not present

## 2021-02-26 DIAGNOSIS — I10 Essential (primary) hypertension: Secondary | ICD-10-CM | POA: Diagnosis not present

## 2021-02-26 DIAGNOSIS — I4819 Other persistent atrial fibrillation: Secondary | ICD-10-CM | POA: Diagnosis not present

## 2021-03-12 DIAGNOSIS — Z7901 Long term (current) use of anticoagulants: Secondary | ICD-10-CM | POA: Diagnosis not present

## 2021-03-12 DIAGNOSIS — I4819 Other persistent atrial fibrillation: Secondary | ICD-10-CM | POA: Diagnosis not present

## 2021-03-12 DIAGNOSIS — E7800 Pure hypercholesterolemia, unspecified: Secondary | ICD-10-CM | POA: Diagnosis not present

## 2021-03-12 DIAGNOSIS — I70219 Atherosclerosis of native arteries of extremities with intermittent claudication, unspecified extremity: Secondary | ICD-10-CM | POA: Diagnosis not present

## 2021-03-12 DIAGNOSIS — I10 Essential (primary) hypertension: Secondary | ICD-10-CM | POA: Diagnosis not present

## 2021-03-12 DIAGNOSIS — R0602 Shortness of breath: Secondary | ICD-10-CM | POA: Diagnosis not present

## 2021-03-12 DIAGNOSIS — Z9989 Dependence on other enabling machines and devices: Secondary | ICD-10-CM | POA: Diagnosis not present

## 2021-03-12 DIAGNOSIS — G4733 Obstructive sleep apnea (adult) (pediatric): Secondary | ICD-10-CM | POA: Diagnosis not present

## 2021-03-12 DIAGNOSIS — Z86711 Personal history of pulmonary embolism: Secondary | ICD-10-CM | POA: Diagnosis not present

## 2021-03-16 ENCOUNTER — Other Ambulatory Visit: Payer: Self-pay

## 2021-03-16 ENCOUNTER — Other Ambulatory Visit
Admission: RE | Admit: 2021-03-16 | Discharge: 2021-03-16 | Disposition: A | Discharge status: Home / Self Care | Payer: Medicare HMO | Source: Ambulatory Visit | Attending: Internal Medicine | Admitting: Internal Medicine

## 2021-03-16 DIAGNOSIS — Z20822 Contact with and (suspected) exposure to covid-19: Secondary | ICD-10-CM | POA: Diagnosis not present

## 2021-03-16 DIAGNOSIS — Z01812 Encounter for preprocedural laboratory examination: Secondary | ICD-10-CM | POA: Diagnosis not present

## 2021-03-16 LAB — SARS CORONAVIRUS 2 (TAT 6-24 HRS): SARS Coronavirus 2: NEGATIVE

## 2021-03-18 ENCOUNTER — Other Ambulatory Visit: Payer: Self-pay

## 2021-03-18 ENCOUNTER — Ambulatory Visit: Payer: Medicare HMO | Admitting: Anesthesiology

## 2021-03-18 ENCOUNTER — Ambulatory Visit
Admission: RE | Admit: 2021-03-18 | Discharge: 2021-03-18 | Disposition: A | Discharge status: Home / Self Care | Payer: Medicare HMO | Attending: Internal Medicine | Admitting: Internal Medicine

## 2021-03-18 ENCOUNTER — Encounter: Payer: Self-pay | Admitting: Internal Medicine

## 2021-03-18 ENCOUNTER — Encounter
Admission: RE | Disposition: A | Discharge status: Home / Self Care | Payer: Self-pay | Source: Home / Self Care | Attending: Internal Medicine

## 2021-03-18 DIAGNOSIS — I4891 Unspecified atrial fibrillation: Secondary | ICD-10-CM | POA: Diagnosis not present

## 2021-03-18 DIAGNOSIS — K219 Gastro-esophageal reflux disease without esophagitis: Secondary | ICD-10-CM | POA: Diagnosis not present

## 2021-03-18 DIAGNOSIS — I480 Paroxysmal atrial fibrillation: Secondary | ICD-10-CM | POA: Diagnosis not present

## 2021-03-18 DIAGNOSIS — Z6841 Body Mass Index (BMI) 40.0 and over, adult: Secondary | ICD-10-CM | POA: Diagnosis not present

## 2021-03-18 DIAGNOSIS — E785 Hyperlipidemia, unspecified: Secondary | ICD-10-CM | POA: Diagnosis not present

## 2021-03-18 DIAGNOSIS — F418 Other specified anxiety disorders: Secondary | ICD-10-CM | POA: Diagnosis not present

## 2021-03-18 HISTORY — PX: CARDIOVERSION: SHX1299

## 2021-03-18 SURGERY — CARDIOVERSION
Anesthesia: General

## 2021-03-18 MED ORDER — PROPOFOL 10 MG/ML IV BOLUS
INTRAVENOUS | Status: DC | PRN
  Administered 2021-03-18: 10 mg via INTRAVENOUS
  Administered 2021-03-18: 50 mg via INTRAVENOUS
  Administered 2021-03-18: 10 mg via INTRAVENOUS

## 2021-03-18 MED ORDER — SODIUM CHLORIDE 0.9 % IV SOLN: INTRAVENOUS | Status: DC | PRN

## 2021-03-18 NOTE — Anesthesia Preprocedure Evaluation (Addendum)
Anesthesia Evaluation  Patient identified by MRN, date of birth, ID band Patient awake    Reviewed: Allergy & Precautions, H&P , NPO status , Patient's Chart, lab work & pertinent test results  History of Anesthesia Complications Negative for: history of anesthetic complications  Airway Mallampati: III  TM Distance: >3 FB     Dental  (+) Upper Dentures   Pulmonary sleep apnea , neg COPD, Current Smoker and Patient abstained from smoking.,     + decreased breath sounds      Cardiovascular hypertension, (-) angina(-) Past MI and (-) Cardiac Stents + dysrhythmias Atrial Fibrillation  Rhythm:irregular Rate:Normal  H/o PE   Neuro/Psych  Headaches, PSYCHIATRIC DISORDERS Anxiety Depression Chronic pain    GI/Hepatic Neg liver ROS, GERD  ,  Endo/Other  Morbid obesityBMI 41  Renal/GU negative Renal ROS  negative genitourinary   Musculoskeletal   Abdominal   Peds  Hematology  (+) Blood dyscrasia, anemia ,   Anesthesia Other Findings Past Medical History: No date: Anxiety No date: Arthritis     Comment:  rheumatoid 10/22/2015: Atypical mole     Comment:  R mid back paraspinal/mod 01/29/2010: Basal cell carcinoma of skin     Comment:  L nasolabial fold 04/22/2015: Basal cell carcinoma of skin     Comment:  L ear sup to tragus/excision 11/25/2015: Basal cell carcinoma of skin     Comment:  R tragus/excision No date: Colon polyps No date: Depression No date: GERD (gastroesophageal reflux disease) No date: Gout No date: Hemorrhoids 05/15/2020: History of basal cell carcinoma (BCC)     Comment:   right lower lip/chin No date: Hypertension 09/25/2014: Lumbar radiculopathy No date: Neuropathy No date: Osteoarthritis No date: Pneumonia 05/25/2014: Pulmonary embolism ([place])     Comment:  Overview:  Times 2 on anticoagulation a.  Times two.                 b.  Filter placed.    Last Assessment & Plan:  Times 2 on               anticoagulation a.  Times two.   b.  Filter placed. No               current bleeding noted.   No date: Reflux No date: Sleep apnea No date: Umbilical hernia  Past Surgical History: No date: COLONOSCOPY No date: ESOPHAGOGASTRODUODENOSCOPY No date: HERNIA REPAIR     Comment:  umbilical 12/28/3233: IVC FILTER INSERTION     Comment:  Bard Denali IVC filter by Dr. Lucky Cowboy 01/02/2019: TOTAL HIP ARTHROPLASTY; Right     Comment:  Procedure: TOTAL HIP ARTHROPLASTY ANTERIOR APPROACH;                Surgeon: Hessie Knows, MD;  Location: [facility] ORS;                Service: Orthopedics;  Laterality: Right;     Reproductive/Obstetrics negative OB ROS                            Anesthesia Physical Anesthesia Plan  ASA: III  Anesthesia Plan: General   Post-op Pain Management:    Induction:   PONV Risk Score and Plan:   Airway Management Planned: Simple Face Mask  Additional Equipment:   Intra-op Plan:   Post-operative Plan:   Informed Consent: I have reviewed the patients History and Physical, chart, labs and discussed the procedure including the  risks, benefits and alternatives for the proposed anesthesia with the patient or authorized representative who has indicated his/her understanding and acceptance.     Dental Advisory Given  Plan Discussed with: Anesthesiologist, CRNA and Surgeon  Anesthesia Plan Comments:         Anesthesia Quick Evaluation

## 2021-03-18 NOTE — Anesthesia Postprocedure Evaluation (Signed)
Anesthesia Post Note  Patient: Adrian [name]  Procedure(s) Performed: CARDIOVERSION (N/A )  Patient location during evaluation: PACU Anesthesia Type: General Level of consciousness: awake and alert Pain management: pain level controlled Vital Signs Assessment: post-procedure vital signs reviewed and stable Respiratory status: spontaneous breathing, nonlabored ventilation and respiratory function stable Cardiovascular status: blood pressure returned to baseline and stable Postop Assessment: no apparent nausea or vomiting Anesthetic complications: no   No complications documented.   Last Vitals:  Vitals:   03/18/21 0800 03/18/21 0815  BP: (!) 100/59 (!) 98/57  Pulse: (!) 47 (!) 51  Resp: 15 16  Temp:    SpO2: 99% 97%    Last Pain:  Vitals:   03/18/21 0815  TempSrc:   PainSc: 0-No pain                 Emre Stock F Naelle Diegel

## 2021-03-18 NOTE — Transfer of Care (Signed)
Immediate Anesthesia Transfer of Care Note  Patient: Adrian [name]  Procedure(s) Performed: CARDIOVERSION (N/A )  Patient Location: Cath Lab  Anesthesia Type:General  Level of Consciousness: awake, alert  and oriented  Airway & Oxygen Therapy: Patient Spontanous Breathing and Patient connected to nasal cannula oxygen  Post-op Assessment: Report given to RN and Post -op Vital signs reviewed and stable  Post vital signs: Reviewed and stable  Last Vitals:  Vitals Value Taken Time  BP 113/58 03/18/21 0748  Temp    Pulse 73 03/18/21 0745  Resp 18 03/18/21 0752  SpO2 99 % 03/18/21 0745    Last Pain:  Vitals:   03/18/21 0718  TempSrc: Oral  PainSc: 0-No pain         Complications: No complications documented.

## 2021-03-18 NOTE — Anesthesia Procedure Notes (Signed)
Date/Time: 03/18/2021 7:50 AM Performed by: Lily Peer, Aretha Levi, CRNA Pre-anesthesia Checklist: Patient identified, Emergency Drugs available, Suction available, Patient being monitored and Timeout performed Oxygen Delivery Method: Nasal cannula Induction Type: IV induction

## 2021-03-18 NOTE — CV Procedure (Signed)
Electrical Cardioversion Procedure Note [name] [id number] 25-Sep-1946  Procedure: Electrical Cardioversion Indications:  Paroxysmal non valvular atrial fibrillation  Procedure Details Consent: Risks of procedure as well as the alternatives and risks of each were explained to the (patient/caregiver).  Consent for procedure obtained. Time Out: Verified patient identification, verified procedure, site/side was marked, verified correct patient position, special equipment/implants available, medications/allergies/relevent history reviewed, required imaging and test results available.  Performed  Patient placed on cardiac monitor, pulse oximetry, supplemental oxygen as necessary.  Sedation given: Propofol and versed as per anesthesia  Pacer pads placed anterior and posterior chest.  Cardioverted 2 time(s).  Cardioverted at 150J.  Evaluation Findings: Post procedure EKG shows: NSR Complications: None Patient did tolerate procedure well.   Adrian [name] M.D. [facility] 03/18/2021, 8:05 AM

## 2021-03-19 ENCOUNTER — Encounter: Payer: Self-pay | Admitting: Internal Medicine

## 2021-03-26 DIAGNOSIS — Z86711 Personal history of pulmonary embolism: Secondary | ICD-10-CM | POA: Diagnosis not present

## 2021-03-28 DIAGNOSIS — G4733 Obstructive sleep apnea (adult) (pediatric): Secondary | ICD-10-CM | POA: Diagnosis not present

## 2021-04-02 ENCOUNTER — Ambulatory Visit: Payer: Medicare HMO | Admitting: Dermatology

## 2021-04-02 DIAGNOSIS — J449 Chronic obstructive pulmonary disease, unspecified: Secondary | ICD-10-CM | POA: Diagnosis not present

## 2021-04-02 DIAGNOSIS — E1142 Type 2 diabetes mellitus with diabetic polyneuropathy: Secondary | ICD-10-CM | POA: Diagnosis not present

## 2021-04-02 DIAGNOSIS — I4891 Unspecified atrial fibrillation: Secondary | ICD-10-CM | POA: Diagnosis not present

## 2021-04-02 DIAGNOSIS — F3341 Major depressive disorder, recurrent, in partial remission: Secondary | ICD-10-CM | POA: Diagnosis not present

## 2021-04-02 DIAGNOSIS — K219 Gastro-esophageal reflux disease without esophagitis: Secondary | ICD-10-CM | POA: Diagnosis not present

## 2021-04-02 DIAGNOSIS — I10 Essential (primary) hypertension: Secondary | ICD-10-CM | POA: Diagnosis not present

## 2021-04-02 DIAGNOSIS — E7800 Pure hypercholesterolemia, unspecified: Secondary | ICD-10-CM | POA: Diagnosis not present

## 2021-04-02 DIAGNOSIS — Z6841 Body Mass Index (BMI) 40.0 and over, adult: Secondary | ICD-10-CM | POA: Diagnosis not present

## 2021-04-16 DIAGNOSIS — I480 Paroxysmal atrial fibrillation: Secondary | ICD-10-CM | POA: Diagnosis not present

## 2021-04-16 DIAGNOSIS — I4891 Unspecified atrial fibrillation: Secondary | ICD-10-CM | POA: Diagnosis not present

## 2021-04-16 DIAGNOSIS — I10 Essential (primary) hypertension: Secondary | ICD-10-CM | POA: Diagnosis not present

## 2021-04-16 DIAGNOSIS — E7800 Pure hypercholesterolemia, unspecified: Secondary | ICD-10-CM | POA: Diagnosis not present

## 2021-04-16 DIAGNOSIS — R001 Bradycardia, unspecified: Secondary | ICD-10-CM | POA: Diagnosis not present

## 2021-04-27 DIAGNOSIS — G4733 Obstructive sleep apnea (adult) (pediatric): Secondary | ICD-10-CM | POA: Diagnosis not present

## 2021-04-29 DIAGNOSIS — Z86711 Personal history of pulmonary embolism: Secondary | ICD-10-CM | POA: Diagnosis not present

## 2021-04-30 DIAGNOSIS — M5416 Radiculopathy, lumbar region: Secondary | ICD-10-CM | POA: Diagnosis not present

## 2021-04-30 DIAGNOSIS — M48062 Spinal stenosis, lumbar region with neurogenic claudication: Secondary | ICD-10-CM | POA: Diagnosis not present

## 2021-05-28 DIAGNOSIS — Z86711 Personal history of pulmonary embolism: Secondary | ICD-10-CM | POA: Diagnosis not present

## 2021-05-28 DIAGNOSIS — G4733 Obstructive sleep apnea (adult) (pediatric): Secondary | ICD-10-CM | POA: Diagnosis not present

## 2021-05-28 DIAGNOSIS — E7800 Pure hypercholesterolemia, unspecified: Secondary | ICD-10-CM | POA: Diagnosis not present

## 2021-05-28 DIAGNOSIS — E1140 Type 2 diabetes mellitus with diabetic neuropathy, unspecified: Secondary | ICD-10-CM | POA: Diagnosis not present

## 2021-06-02 DIAGNOSIS — M5126 Other intervertebral disc displacement, lumbar region: Secondary | ICD-10-CM | POA: Diagnosis not present

## 2021-06-02 DIAGNOSIS — G8929 Other chronic pain: Secondary | ICD-10-CM | POA: Diagnosis not present

## 2021-06-02 DIAGNOSIS — M5136 Other intervertebral disc degeneration, lumbar region: Secondary | ICD-10-CM | POA: Diagnosis not present

## 2021-06-02 DIAGNOSIS — M5416 Radiculopathy, lumbar region: Secondary | ICD-10-CM | POA: Diagnosis not present

## 2021-06-02 DIAGNOSIS — M25511 Pain in right shoulder: Secondary | ICD-10-CM | POA: Diagnosis not present

## 2021-06-02 DIAGNOSIS — M48062 Spinal stenosis, lumbar region with neurogenic claudication: Secondary | ICD-10-CM | POA: Diagnosis not present

## 2021-06-15 DIAGNOSIS — Z86711 Personal history of pulmonary embolism: Secondary | ICD-10-CM | POA: Diagnosis not present

## 2021-06-27 DIAGNOSIS — G4733 Obstructive sleep apnea (adult) (pediatric): Secondary | ICD-10-CM | POA: Diagnosis not present

## 2021-07-01 DIAGNOSIS — Z0100 Encounter for examination of eyes and vision without abnormal findings: Secondary | ICD-10-CM | POA: Diagnosis not present

## 2021-07-16 DIAGNOSIS — Z86711 Personal history of pulmonary embolism: Secondary | ICD-10-CM | POA: Diagnosis not present

## 2021-08-05 DIAGNOSIS — Z7901 Long term (current) use of anticoagulants: Secondary | ICD-10-CM | POA: Diagnosis not present

## 2021-08-06 ENCOUNTER — Ambulatory Visit: Payer: Medicare HMO | Admitting: Dermatology

## 2021-08-06 DIAGNOSIS — Z7901 Long term (current) use of anticoagulants: Secondary | ICD-10-CM | POA: Diagnosis not present

## 2021-08-13 DIAGNOSIS — R001 Bradycardia, unspecified: Secondary | ICD-10-CM | POA: Diagnosis not present

## 2021-08-13 DIAGNOSIS — E7800 Pure hypercholesterolemia, unspecified: Secondary | ICD-10-CM | POA: Diagnosis not present

## 2021-08-13 DIAGNOSIS — I480 Paroxysmal atrial fibrillation: Secondary | ICD-10-CM | POA: Diagnosis not present

## 2021-08-13 DIAGNOSIS — G4733 Obstructive sleep apnea (adult) (pediatric): Secondary | ICD-10-CM | POA: Diagnosis not present

## 2021-08-13 DIAGNOSIS — R0602 Shortness of breath: Secondary | ICD-10-CM | POA: Diagnosis not present

## 2021-08-13 DIAGNOSIS — Z7901 Long term (current) use of anticoagulants: Secondary | ICD-10-CM | POA: Diagnosis not present

## 2021-08-13 DIAGNOSIS — Z9989 Dependence on other enabling machines and devices: Secondary | ICD-10-CM | POA: Diagnosis not present

## 2021-08-13 DIAGNOSIS — Z86711 Personal history of pulmonary embolism: Secondary | ICD-10-CM | POA: Diagnosis not present

## 2021-08-13 DIAGNOSIS — I10 Essential (primary) hypertension: Secondary | ICD-10-CM | POA: Diagnosis not present

## 2021-08-19 DIAGNOSIS — Z7901 Long term (current) use of anticoagulants: Secondary | ICD-10-CM | POA: Diagnosis not present

## 2021-08-27 DIAGNOSIS — Z03818 Encounter for observation for suspected exposure to other biological agents ruled out: Secondary | ICD-10-CM | POA: Diagnosis not present

## 2021-08-27 DIAGNOSIS — R52 Pain, unspecified: Secondary | ICD-10-CM | POA: Diagnosis not present

## 2021-09-03 ENCOUNTER — Other Ambulatory Visit: Payer: Self-pay

## 2021-09-03 ENCOUNTER — Ambulatory Visit: Payer: Medicare HMO | Admitting: Dermatology

## 2021-09-03 DIAGNOSIS — L439 Lichen planus, unspecified: Secondary | ICD-10-CM

## 2021-09-03 DIAGNOSIS — M2550 Pain in unspecified joint: Secondary | ICD-10-CM | POA: Diagnosis not present

## 2021-09-03 DIAGNOSIS — L438 Other lichen planus: Secondary | ICD-10-CM | POA: Diagnosis not present

## 2021-09-03 DIAGNOSIS — Z1283 Encounter for screening for malignant neoplasm of skin: Secondary | ICD-10-CM | POA: Diagnosis not present

## 2021-09-03 DIAGNOSIS — L821 Other seborrheic keratosis: Secondary | ICD-10-CM

## 2021-09-03 DIAGNOSIS — L820 Inflamed seborrheic keratosis: Secondary | ICD-10-CM

## 2021-09-03 MED ORDER — FLUOCINONIDE 0.05 % EX GEL: 1.0000 "application " | CUTANEOUS | 1 refills | Status: DC

## 2021-09-03 NOTE — Progress Notes (Signed)
   Follow-Up Visit   Subjective  Adrian [name] is a 75 y.o. male who presents for the following: Other (Recheck spot of right infraocular - he does not want to treat.//Rough spots of arms and trunk).  He also has a rash of his inside mouth which he is used topical fluocinonide ointment with good results.  This was prescribed many years ago by another physician.  He is unaware of what his diagnosis was. The patient presents for Upper Body Skin Exam (UBSE) for skin cancer screening and mole check.  The following portions of the chart were reviewed this encounter and updated as appropriate:   Tobacco  Allergies  Meds  Problems  Med Hx  Surg Hx  Fam Hx     Review of Systems:  No other skin or systemic complaints except as noted in HPI or Assessment and Plan.  Objective  Well appearing patient in no apparent distress; mood and affect are within normal limits.  All skin waist up examined.  Arms/hands x 6, right deltoid x 1, right infraorbital x 1 - Total 8 (8) Erythematous keratotic or waxy stuck-on papule or plaque.   Bilateral buccal mucosa  White lacy patches = [place] striae   Assessment & Plan   Seborrheic Keratoses - Stuck-on, waxy, tan-brown papules and/or plaques  - Benign-appearing - Discussed benign etiology and prognosis. - Observe - Call for any changes   Inflamed seborrheic keratosis Arms/hands x 6, right deltoid x 1, right infraorbital /right lower eyelid margin x 1 - Total 8  Destruction of lesion - Arms/hands x 6, right deltoid x 1, right infraorbital x 1 - Total 8 Complexity: simple   Destruction method: cryotherapy   Informed consent: discussed and consent obtained   Timeout:  patient name, date of birth, surgical site, and procedure verified Lesion destroyed using liquid nitrogen: Yes   Region frozen until ice ball extended beyond lesion: Yes   Outcome: patient tolerated procedure well with no complications   Post-procedure details: wound care  instructions given    Oral lichen planus Bilateral buccal mucosa Chronic and persistent Fluocinonide gel qd prn  fluocinonide gel (LIDEX) 0.05 % -oral/buccal mucosa  Apply 1 application topically as directed. Qd prn  Skin cancer screening  Return in about 2 months (around 11/03/2021) for Follow up.  I, Ashok Cordia, CMA, am acting as scribe for Sarina Ser, MD . Documentation: I have reviewed the above documentation for accuracy and completeness, and I agree with the above.  Sarina Ser, MD

## 2021-09-03 NOTE — Patient Instructions (Signed)
Differin 0.1% gel to face at bedtime.  Cryotherapy Aftercare  Wash gently with soap and water everyday.   Apply Vaseline and Band-Aid daily until healed.     If you have any questions or concerns for your doctor, please call our main line at [phone] and press option 4 to reach your doctor's medical assistant. If no one answers, please leave a voicemail as directed and we will return your call as soon as possible. Messages left after 4 pm will be answered the following business day.   You may also send [place] a message via [place]. We typically respond to MyChart messages within 1-2 business days.  For prescription refills, please ask your pharmacy to contact our office. Our fax number is [phone].  If you have an urgent issue when the clinic is closed that cannot wait until the next business day, you can page your doctor at the number below.    Please note that while we do our best to be available for urgent issues outside of office hours, we are not available 24/7.   If you have an urgent issue and are unable to reach [place], you may choose to seek medical care at your doctor's office, retail clinic, urgent care center, or emergency room.  If you have a medical emergency, please immediately call 911 or go to the emergency department.  Pager Numbers  - Dr. Nehemiah Massed: [phone]  - Dr. Laurence Ferrari: [phone]  - Dr. Nicole Kindred: [phone]  In the event of inclement weather, please call our main line at [phone] for an update on the status of any delays or closures.  Dermatology Medication Tips: Please keep the boxes that topical medications come in in order to help keep track of the instructions about where and how to use these. Pharmacies typically print the medication instructions only on the boxes and not directly on the medication tubes.   If your medication is too expensive, please contact our office at [phone] option 4 or send [place] a message through [place].   We are  unable to tell what your co-pay for medications will be in advance as this is different depending on your insurance coverage. However, we may be able to find a substitute medication at lower cost or fill out paperwork to get insurance to cover a needed medication.   If a prior authorization is required to get your medication covered by your insurance company, please allow [place] 1-2 business days to complete this process.  Drug prices often vary depending on where the prescription is filled and some pharmacies may offer cheaper prices.  The website www.goodrx.com contains coupons for medications through different pharmacies. The prices here do not account for what the cost may be with help from insurance (it may be cheaper with your insurance), but the website can give you the price if you did not use any insurance.  - You can print the associated coupon and take it with your prescription to the pharmacy.  - You may also stop by our office during regular business hours and pick up a GoodRx coupon card.  - If you need your prescription sent electronically to a different pharmacy, notify our office through [facility] or by phone at [phone] option 4.

## 2021-09-06 ENCOUNTER — Encounter: Payer: Self-pay | Admitting: Dermatology

## 2021-09-09 DIAGNOSIS — Z7901 Long term (current) use of anticoagulants: Secondary | ICD-10-CM | POA: Diagnosis not present

## 2021-09-11 DIAGNOSIS — M5416 Radiculopathy, lumbar region: Secondary | ICD-10-CM | POA: Diagnosis not present

## 2021-09-11 DIAGNOSIS — M48062 Spinal stenosis, lumbar region with neurogenic claudication: Secondary | ICD-10-CM | POA: Diagnosis not present

## 2021-09-11 DIAGNOSIS — M5136 Other intervertebral disc degeneration, lumbar region: Secondary | ICD-10-CM | POA: Diagnosis not present

## 2021-09-11 DIAGNOSIS — Z79899 Other long term (current) drug therapy: Secondary | ICD-10-CM | POA: Diagnosis not present

## 2021-09-11 DIAGNOSIS — M5126 Other intervertebral disc displacement, lumbar region: Secondary | ICD-10-CM | POA: Diagnosis not present

## 2021-09-17 DIAGNOSIS — I480 Paroxysmal atrial fibrillation: Secondary | ICD-10-CM | POA: Diagnosis not present

## 2021-09-17 DIAGNOSIS — Z7901 Long term (current) use of anticoagulants: Secondary | ICD-10-CM | POA: Diagnosis not present

## 2021-09-23 DIAGNOSIS — M25462 Effusion, left knee: Secondary | ICD-10-CM | POA: Diagnosis not present

## 2021-09-23 DIAGNOSIS — M25562 Pain in left knee: Secondary | ICD-10-CM | POA: Diagnosis not present

## 2021-09-23 DIAGNOSIS — M13862 Other specified arthritis, left knee: Secondary | ICD-10-CM | POA: Diagnosis not present

## 2021-09-24 DIAGNOSIS — Z6841 Body Mass Index (BMI) 40.0 and over, adult: Secondary | ICD-10-CM | POA: Diagnosis not present

## 2021-09-24 DIAGNOSIS — R110 Nausea: Secondary | ICD-10-CM | POA: Diagnosis not present

## 2021-09-24 DIAGNOSIS — Z7901 Long term (current) use of anticoagulants: Secondary | ICD-10-CM | POA: Diagnosis not present

## 2021-09-24 DIAGNOSIS — Z23 Encounter for immunization: Secondary | ICD-10-CM | POA: Diagnosis not present

## 2021-10-01 DIAGNOSIS — R1031 Right lower quadrant pain: Secondary | ICD-10-CM | POA: Diagnosis not present

## 2021-10-01 DIAGNOSIS — F419 Anxiety disorder, unspecified: Secondary | ICD-10-CM | POA: Diagnosis not present

## 2021-10-01 DIAGNOSIS — Z7901 Long term (current) use of anticoagulants: Secondary | ICD-10-CM | POA: Diagnosis not present

## 2021-10-01 DIAGNOSIS — F3341 Major depressive disorder, recurrent, in partial remission: Secondary | ICD-10-CM | POA: Diagnosis not present

## 2021-10-01 DIAGNOSIS — K5904 Chronic idiopathic constipation: Secondary | ICD-10-CM | POA: Diagnosis not present

## 2021-10-08 DIAGNOSIS — K219 Gastro-esophageal reflux disease without esophagitis: Secondary | ICD-10-CM | POA: Diagnosis not present

## 2021-10-08 DIAGNOSIS — F3341 Major depressive disorder, recurrent, in partial remission: Secondary | ICD-10-CM | POA: Diagnosis not present

## 2021-10-08 DIAGNOSIS — E1140 Type 2 diabetes mellitus with diabetic neuropathy, unspecified: Secondary | ICD-10-CM | POA: Diagnosis not present

## 2021-10-08 DIAGNOSIS — I10 Essential (primary) hypertension: Secondary | ICD-10-CM | POA: Diagnosis not present

## 2021-10-08 DIAGNOSIS — I4891 Unspecified atrial fibrillation: Secondary | ICD-10-CM | POA: Diagnosis not present

## 2021-10-08 DIAGNOSIS — J449 Chronic obstructive pulmonary disease, unspecified: Secondary | ICD-10-CM | POA: Diagnosis not present

## 2021-10-08 DIAGNOSIS — F419 Anxiety disorder, unspecified: Secondary | ICD-10-CM | POA: Diagnosis not present

## 2021-10-08 DIAGNOSIS — Z0000 Encounter for general adult medical examination without abnormal findings: Secondary | ICD-10-CM | POA: Diagnosis not present

## 2021-10-08 DIAGNOSIS — E7800 Pure hypercholesterolemia, unspecified: Secondary | ICD-10-CM | POA: Diagnosis not present

## 2021-10-08 DIAGNOSIS — Z7901 Long term (current) use of anticoagulants: Secondary | ICD-10-CM | POA: Diagnosis not present

## 2021-10-08 DIAGNOSIS — M1A00X0 Idiopathic chronic gout, unspecified site, without tophus (tophi): Secondary | ICD-10-CM | POA: Diagnosis not present

## 2021-10-08 DIAGNOSIS — Z1389 Encounter for screening for other disorder: Secondary | ICD-10-CM | POA: Diagnosis not present

## 2021-10-21 DIAGNOSIS — Z7901 Long term (current) use of anticoagulants: Secondary | ICD-10-CM | POA: Diagnosis not present

## 2021-10-22 DIAGNOSIS — M48062 Spinal stenosis, lumbar region with neurogenic claudication: Secondary | ICD-10-CM | POA: Diagnosis not present

## 2021-10-22 DIAGNOSIS — M5416 Radiculopathy, lumbar region: Secondary | ICD-10-CM | POA: Diagnosis not present

## 2021-10-29 DIAGNOSIS — G8929 Other chronic pain: Secondary | ICD-10-CM | POA: Diagnosis not present

## 2021-10-29 DIAGNOSIS — M25562 Pain in left knee: Secondary | ICD-10-CM | POA: Diagnosis not present

## 2021-10-29 DIAGNOSIS — Z6841 Body Mass Index (BMI) 40.0 and over, adult: Secondary | ICD-10-CM | POA: Diagnosis not present

## 2021-10-29 DIAGNOSIS — M1712 Unilateral primary osteoarthritis, left knee: Secondary | ICD-10-CM | POA: Diagnosis not present

## 2021-11-05 ENCOUNTER — Ambulatory Visit: Payer: Medicare HMO | Admitting: Dermatology

## 2021-11-13 DIAGNOSIS — M25462 Effusion, left knee: Secondary | ICD-10-CM | POA: Diagnosis not present

## 2021-11-19 DIAGNOSIS — M1712 Unilateral primary osteoarthritis, left knee: Secondary | ICD-10-CM | POA: Diagnosis not present

## 2021-11-27 DIAGNOSIS — M1712 Unilateral primary osteoarthritis, left knee: Secondary | ICD-10-CM | POA: Diagnosis not present

## 2021-12-03 DIAGNOSIS — M1712 Unilateral primary osteoarthritis, left knee: Secondary | ICD-10-CM | POA: Diagnosis not present

## 2021-12-17 DIAGNOSIS — Z6841 Body Mass Index (BMI) 40.0 and over, adult: Secondary | ICD-10-CM | POA: Diagnosis not present

## 2021-12-17 DIAGNOSIS — M1712 Unilateral primary osteoarthritis, left knee: Secondary | ICD-10-CM | POA: Diagnosis not present

## 2021-12-17 DIAGNOSIS — M25462 Effusion, left knee: Secondary | ICD-10-CM | POA: Diagnosis not present

## 2021-12-17 DIAGNOSIS — E1140 Type 2 diabetes mellitus with diabetic neuropathy, unspecified: Secondary | ICD-10-CM | POA: Diagnosis not present

## 2021-12-31 DIAGNOSIS — Z7901 Long term (current) use of anticoagulants: Secondary | ICD-10-CM | POA: Diagnosis not present

## 2021-12-31 DIAGNOSIS — Z9989 Dependence on other enabling machines and devices: Secondary | ICD-10-CM | POA: Diagnosis not present

## 2021-12-31 DIAGNOSIS — I10 Essential (primary) hypertension: Secondary | ICD-10-CM | POA: Diagnosis not present

## 2021-12-31 DIAGNOSIS — G4733 Obstructive sleep apnea (adult) (pediatric): Secondary | ICD-10-CM | POA: Diagnosis not present

## 2021-12-31 DIAGNOSIS — R739 Hyperglycemia, unspecified: Secondary | ICD-10-CM | POA: Diagnosis not present

## 2021-12-31 DIAGNOSIS — R001 Bradycardia, unspecified: Secondary | ICD-10-CM | POA: Diagnosis not present

## 2021-12-31 DIAGNOSIS — I480 Paroxysmal atrial fibrillation: Secondary | ICD-10-CM | POA: Diagnosis not present

## 2021-12-31 DIAGNOSIS — E7800 Pure hypercholesterolemia, unspecified: Secondary | ICD-10-CM | POA: Diagnosis not present

## 2022-01-04 DIAGNOSIS — Z79899 Other long term (current) drug therapy: Secondary | ICD-10-CM | POA: Diagnosis not present

## 2022-01-04 DIAGNOSIS — M5136 Other intervertebral disc degeneration, lumbar region: Secondary | ICD-10-CM | POA: Diagnosis not present

## 2022-01-04 DIAGNOSIS — M5126 Other intervertebral disc displacement, lumbar region: Secondary | ICD-10-CM | POA: Diagnosis not present

## 2022-01-04 DIAGNOSIS — M5416 Radiculopathy, lumbar region: Secondary | ICD-10-CM | POA: Diagnosis not present

## 2022-01-07 DIAGNOSIS — I70219 Atherosclerosis of native arteries of extremities with intermittent claudication, unspecified extremity: Secondary | ICD-10-CM | POA: Diagnosis not present

## 2022-01-07 DIAGNOSIS — Z6841 Body Mass Index (BMI) 40.0 and over, adult: Secondary | ICD-10-CM | POA: Diagnosis not present

## 2022-01-07 DIAGNOSIS — E1140 Type 2 diabetes mellitus with diabetic neuropathy, unspecified: Secondary | ICD-10-CM | POA: Diagnosis not present

## 2022-01-07 DIAGNOSIS — Z86711 Personal history of pulmonary embolism: Secondary | ICD-10-CM | POA: Diagnosis not present

## 2022-01-07 DIAGNOSIS — M1712 Unilateral primary osteoarthritis, left knee: Secondary | ICD-10-CM | POA: Diagnosis not present

## 2022-01-16 DIAGNOSIS — M1712 Unilateral primary osteoarthritis, left knee: Secondary | ICD-10-CM | POA: Diagnosis not present

## 2022-01-28 DIAGNOSIS — Z7901 Long term (current) use of anticoagulants: Secondary | ICD-10-CM | POA: Diagnosis not present

## 2022-03-04 DIAGNOSIS — Z7901 Long term (current) use of anticoagulants: Secondary | ICD-10-CM | POA: Diagnosis not present

## 2022-04-01 DIAGNOSIS — Z7901 Long term (current) use of anticoagulants: Secondary | ICD-10-CM | POA: Diagnosis not present

## 2022-04-08 DIAGNOSIS — M5416 Radiculopathy, lumbar region: Secondary | ICD-10-CM | POA: Diagnosis not present

## 2022-04-08 DIAGNOSIS — M48062 Spinal stenosis, lumbar region with neurogenic claudication: Secondary | ICD-10-CM | POA: Diagnosis not present

## 2022-04-08 DIAGNOSIS — M5126 Other intervertebral disc displacement, lumbar region: Secondary | ICD-10-CM | POA: Diagnosis not present

## 2022-04-08 DIAGNOSIS — M7918 Myalgia, other site: Secondary | ICD-10-CM | POA: Diagnosis not present

## 2022-04-08 DIAGNOSIS — M2550 Pain in unspecified joint: Secondary | ICD-10-CM | POA: Diagnosis not present

## 2022-04-08 DIAGNOSIS — Z7901 Long term (current) use of anticoagulants: Secondary | ICD-10-CM | POA: Diagnosis not present

## 2022-04-08 DIAGNOSIS — M25562 Pain in left knee: Secondary | ICD-10-CM | POA: Diagnosis not present

## 2022-04-08 DIAGNOSIS — M62830 Muscle spasm of back: Secondary | ICD-10-CM | POA: Diagnosis not present

## 2022-04-08 DIAGNOSIS — G8929 Other chronic pain: Secondary | ICD-10-CM | POA: Diagnosis not present

## 2022-04-15 DIAGNOSIS — I4891 Unspecified atrial fibrillation: Secondary | ICD-10-CM | POA: Diagnosis not present

## 2022-04-15 DIAGNOSIS — M1A00X0 Idiopathic chronic gout, unspecified site, without tophus (tophi): Secondary | ICD-10-CM | POA: Diagnosis not present

## 2022-04-15 DIAGNOSIS — F3341 Major depressive disorder, recurrent, in partial remission: Secondary | ICD-10-CM | POA: Diagnosis not present

## 2022-04-15 DIAGNOSIS — E7800 Pure hypercholesterolemia, unspecified: Secondary | ICD-10-CM | POA: Diagnosis not present

## 2022-04-15 DIAGNOSIS — K219 Gastro-esophageal reflux disease without esophagitis: Secondary | ICD-10-CM | POA: Diagnosis not present

## 2022-04-15 DIAGNOSIS — F419 Anxiety disorder, unspecified: Secondary | ICD-10-CM | POA: Diagnosis not present

## 2022-04-15 DIAGNOSIS — I10 Essential (primary) hypertension: Secondary | ICD-10-CM | POA: Diagnosis not present

## 2022-04-15 DIAGNOSIS — E1140 Type 2 diabetes mellitus with diabetic neuropathy, unspecified: Secondary | ICD-10-CM | POA: Diagnosis not present

## 2022-04-15 DIAGNOSIS — J449 Chronic obstructive pulmonary disease, unspecified: Secondary | ICD-10-CM | POA: Diagnosis not present

## 2022-04-28 DIAGNOSIS — Z7901 Long term (current) use of anticoagulants: Secondary | ICD-10-CM | POA: Diagnosis not present

## 2022-04-29 DIAGNOSIS — M5126 Other intervertebral disc displacement, lumbar region: Secondary | ICD-10-CM | POA: Diagnosis not present

## 2022-04-29 DIAGNOSIS — M5416 Radiculopathy, lumbar region: Secondary | ICD-10-CM | POA: Diagnosis not present

## 2022-05-04 DIAGNOSIS — M7918 Myalgia, other site: Secondary | ICD-10-CM | POA: Diagnosis not present

## 2022-05-04 DIAGNOSIS — M47816 Spondylosis without myelopathy or radiculopathy, lumbar region: Secondary | ICD-10-CM | POA: Diagnosis not present

## 2022-05-04 DIAGNOSIS — M2550 Pain in unspecified joint: Secondary | ICD-10-CM | POA: Diagnosis not present

## 2022-05-06 ENCOUNTER — Ambulatory Visit: Payer: Medicare HMO | Admitting: Dermatology

## 2022-05-20 DIAGNOSIS — M5126 Other intervertebral disc displacement, lumbar region: Secondary | ICD-10-CM | POA: Diagnosis not present

## 2022-05-20 DIAGNOSIS — M5416 Radiculopathy, lumbar region: Secondary | ICD-10-CM | POA: Diagnosis not present

## 2022-05-20 DIAGNOSIS — M48062 Spinal stenosis, lumbar region with neurogenic claudication: Secondary | ICD-10-CM | POA: Diagnosis not present

## 2022-05-24 DIAGNOSIS — Z7901 Long term (current) use of anticoagulants: Secondary | ICD-10-CM | POA: Diagnosis not present

## 2022-05-25 DIAGNOSIS — M5416 Radiculopathy, lumbar region: Secondary | ICD-10-CM | POA: Diagnosis not present

## 2022-06-10 ENCOUNTER — Ambulatory Visit: Payer: Medicare HMO | Admitting: Dermatology

## 2022-06-10 DIAGNOSIS — D1801 Hemangioma of skin and subcutaneous tissue: Secondary | ICD-10-CM

## 2022-06-10 DIAGNOSIS — L700 Acne vulgaris: Secondary | ICD-10-CM | POA: Diagnosis not present

## 2022-06-10 DIAGNOSIS — Z1283 Encounter for screening for malignant neoplasm of skin: Secondary | ICD-10-CM

## 2022-06-10 DIAGNOSIS — Z85828 Personal history of other malignant neoplasm of skin: Secondary | ICD-10-CM | POA: Diagnosis not present

## 2022-06-10 DIAGNOSIS — D229 Melanocytic nevi, unspecified: Secondary | ICD-10-CM

## 2022-06-10 DIAGNOSIS — L814 Other melanin hyperpigmentation: Secondary | ICD-10-CM | POA: Diagnosis not present

## 2022-06-10 DIAGNOSIS — D692 Other nonthrombocytopenic purpura: Secondary | ICD-10-CM

## 2022-06-10 DIAGNOSIS — L821 Other seborrheic keratosis: Secondary | ICD-10-CM

## 2022-06-10 DIAGNOSIS — D1800 Hemangioma unspecified site: Secondary | ICD-10-CM | POA: Diagnosis not present

## 2022-06-10 DIAGNOSIS — L918 Other hypertrophic disorders of the skin: Secondary | ICD-10-CM | POA: Diagnosis not present

## 2022-06-10 DIAGNOSIS — L820 Inflamed seborrheic keratosis: Secondary | ICD-10-CM

## 2022-06-10 DIAGNOSIS — L578 Other skin changes due to chronic exposure to nonionizing radiation: Secondary | ICD-10-CM

## 2022-06-10 NOTE — Progress Notes (Signed)
Follow-Up Visit   Subjective  Adrian [name] is a 76 y.o. male who presents for the following: Skin Tag (Pt here to have irritated tags under his arms removed today). Check acne on his nose pt would like to try a topical cream for acne. The patient presents for Upper Body Skin Exam (UBSE) for skin cancer screening and mole check.  The patient has spots, moles and lesions to be evaluated, some may be new or changing and the patient has concerns that these could be cancer.   The following portions of the chart were reviewed this encounter and updated as appropriate:   Tobacco  Allergies  Meds  Problems  Med Hx  Surg Hx  Fam Hx     Review of Systems:  No other skin or systemic complaints except as noted in HPI or Assessment and Plan.  Objective  Well appearing patient in no apparent distress; mood and affect are within normal limits.  All skin waist up examined.  Head - Anterior (Face) Comedonal acne at face/nose     left lower abdomen x 1, left axilla x 2 (3) Stuck-on, waxy, tan-brown papule or plaque --Discussed benign etiology and prognosis.    Assessment & Plan  Acne vulgaris Head - Anterior (Face)  Comedonal Acne    Start otc Differin 0.1% gel apply to face at bedtime   Inflamed seborrheic keratosis (3) left lower abdomen x 1, left axilla x 2  Symptomatic, irritating, patient would like treated.   Destruction of lesion - left lower abdomen x 1, left axilla x 2 Complexity: simple   Destruction method: cryotherapy   Informed consent: discussed and consent obtained   Timeout:  patient name, date of birth, surgical site, and procedure verified Lesion destroyed using liquid nitrogen: Yes   Region frozen until ice ball extended beyond lesion: Yes   Outcome: patient tolerated procedure well with no complications   Post-procedure details: wound care instructions given    Skin cancer screening  Lentigines - Scattered tan macules - Due to sun exposure -  Benign-appearing, observe - Recommend daily broad spectrum sunscreen SPF 30+ to sun-exposed areas, reapply every 2 hours as needed. - Call for any changes  Seborrheic Keratoses - Stuck-on, waxy, tan-brown papules and/or plaques  - Benign-appearing - Discussed benign etiology and prognosis. - Observe - Call for any changes  Melanocytic Nevi - Tan-brown and/or pink-flesh-colored symmetric macules and papules - Benign appearing on exam today - Observation - Call clinic for new or changing moles - Recommend daily use of broad spectrum spf 30+ sunscreen to sun-exposed areas.   Hemangiomas - Red papules - Discussed benign nature - Observe - Call for any changes  Actinic Damage - Chronic condition, secondary to cumulative UV/sun exposure - diffuse scaly erythematous macules with underlying dyspigmentation - Recommend daily broad spectrum sunscreen SPF 30+ to sun-exposed areas, reapply every 2 hours as needed.  - Staying in the shade or wearing long sleeves, sun glasses (UVA+UVB protection) and wide brim hats (4-inch brim around the entire circumference of the hat) are also recommended for sun protection.  - Call for new or changing lesions.  Purpura - Chronic; persistent and recurrent.  Treatable, but not curable. - Violaceous macules and patches - Benign - Related to trauma, age, sun damage and/or use of blood thinners, chronic use of topical and/or oral steroids - Observe - Can use OTC arnica containing moisturizer such as Dermend Bruise Formula if desired - Call for worsening or other concerns  Acrochordons (Skin Tags) - Removal desired by patient We will treat 12 skin tags at left axilla today  - Fleshy, skin-colored pedunculated papules - Benign appearing.  - Patient desires removal. Reviewed that this is not covered by insurance and they will be charged a cosmetic fee for removal. Patient signed non-covered consent.  - Prior to the procedure, reviewed the expected small  wound. Also reviewed the risk of leaving a small scar and the small risk of infection.  PROCEDURE - The areas were prepped with isopropyl alcohol. A small amount of lidocaine 1% with epinephrine was injected at the base of each lesion to achieve good local anesthesia. The skin tags were removed using a snip technique. Aluminum chloride was used for hemostasis. Petrolatum and a bandage were applied. The procedure was tolerated well. - Wound care was reviewed with the patient. They were advised to call with any concerns. Total number of  treated acrochordons 12   Return to the office to have skin tags at right axilla removed.   History of Basal Cell Carcinoma of the Skin Multiple see history  - No evidence of recurrence today - Recommend regular full body skin exams - Recommend daily broad spectrum sunscreen SPF 30+ to sun-exposed areas, reapply every 2 hours as needed.  - Call if any new or changing lesions are noted between office visits   Skin cancer screening performed today.   Return in about 6 months (around 12/10/2022) for TBSE .  IMarye Round, CMA, am acting as scribe for Sarina Ser, MD .  Documentation: I have reviewed the above documentation for accuracy and completeness, and I agree with the above.  Sarina Ser, MD

## 2022-06-10 NOTE — Patient Instructions (Addendum)
Start over the counter Differin 0.1% gel apply to affected areas on nose and face at bedtime     Due to recent changes in healthcare laws, you may see results of your pathology and/or laboratory studies on MyChart before the doctors have had a chance to review them. We understand that in some cases there may be results that are confusing or concerning to you. Please understand that not all results are received at the same time and often the doctors may need to interpret multiple results in order to provide you with the best plan of care or course of treatment. Therefore, we ask that you please give [place] 2 business days to thoroughly review all your results before contacting the office for clarification. Should we see a critical lab result, you will be contacted sooner.   If You Need Anything After Your Visit  If you have any questions or concerns for your doctor, please call our main line at [phone] and press option 4 to reach your doctor's medical assistant. If no one answers, please leave a voicemail as directed and we will return your call as soon as possible. Messages left after 4 pm will be answered the following business day.   You may also send [place] a message via [place]. We typically respond to MyChart messages within 1-2 business days.  For prescription refills, please ask your pharmacy to contact our office. Our fax number is [phone].  If you have an urgent issue when the clinic is closed that cannot wait until the next business day, you can page your doctor at the number below.    Please note that while we do our best to be available for urgent issues outside of office hours, we are not available 24/7.   If you have an urgent issue and are unable to reach [place], you may choose to seek medical care at your doctor's office, retail clinic, urgent care center, or emergency room.  If you have a medical emergency, please immediately call 911 or go to the emergency department.  Pager  Numbers  - Dr. Nehemiah Massed: [phone]  - Dr. Laurence Ferrari: [phone]  - Dr. Nicole Kindred: [phone]  In the event of inclement weather, please call our main line at [phone] for an update on the status of any delays or closures.  Dermatology Medication Tips: Please keep the boxes that topical medications come in in order to help keep track of the instructions about where and how to use these. Pharmacies typically print the medication instructions only on the boxes and not directly on the medication tubes.   If your medication is too expensive, please contact our office at [phone] option 4 or send [place] a message through [place].   We are unable to tell what your co-pay for medications will be in advance as this is different depending on your insurance coverage. However, we may be able to find a substitute medication at lower cost or fill out paperwork to get insurance to cover a needed medication.   If a prior authorization is required to get your medication covered by your insurance company, please allow [place] 1-2 business days to complete this process.  Drug prices often vary depending on where the prescription is filled and some pharmacies may offer cheaper prices.  The website www.goodrx.com contains coupons for medications through different pharmacies. The prices here do not account for what the cost may be with help from insurance (it may be cheaper with your insurance), but the website can give you  the price if you did not use any insurance.  - You can print the associated coupon and take it with your prescription to the pharmacy.  - You may also stop by our office during regular business hours and pick up a GoodRx coupon card.  - If you need your prescription sent electronically to a different pharmacy, notify our office through [facility] or by phone at [phone] option 4.     Si Usted Necesita Algo Despus de Su Visita  Tambin puede enviarnos un mensaje a travs de  Pharmacist, community. Por lo general respondemos a los mensajes de MyChart en el transcurso de 1 a 2 das hbiles.  Para renovar recetas, por favor pida a su farmacia que se ponga en contacto con nuestra oficina. Harland Dingwall de fax es [place] [phone].  Si tiene un asunto urgente cuando la clnica est cerrada y que no puede esperar hasta el siguiente da hbil, puede llamar/localizar a su doctor(a) al nmero que aparece a continuacin.   Por favor, tenga en cuenta que aunque hacemos todo lo posible para estar disponibles para asuntos urgentes fuera del horario de [place], no estamos disponibles las 24 horas del da, los 7 das de la [place].   Si tiene un problema urgente y no puede comunicarse con nosotros, puede optar por buscar atencin mdica  en el consultorio de su doctor(a), en una clnica privada, en un centro de atencin urgente o en una sala de emergencias.  Si tiene Engineering geologist, por favor llame inmediatamente al 911 o vaya a la sala de emergencias.  Nmeros de bper  - Dr. Nehemiah Massed: [phone]  - Dra. Moye: [phone]  - Dra. Nicole Kindred: [phone]  En caso de inclemencias del [place], por favor llame a Johnsie Kindred principal al [phone] para una actualizacin sobre el [place] de cualquier retraso o cierre.  Consejos para la medicacin en dermatologa: Por favor, guarde las cajas en las que vienen los medicamentos de uso tpico para ayudarle a seguir las instrucciones sobre dnde y cmo usarlos. Las farmacias generalmente imprimen las instrucciones del medicamento slo en las cajas y no directamente en los tubos del [place].   Si su medicamento es muy caro, por favor, pngase en contacto con Zigmund Daniel llamando al [phone] y presione la opcin 4 o envenos un mensaje a travs de Pharmacist, community.   No podemos decirle cul ser su copago por los medicamentos por adelantado ya que esto es diferente dependiendo de la cobertura de su seguro. Sin embargo, es posible que  podamos encontrar un medicamento sustituto a Electrical engineer un formulario para que el seguro cubra el medicamento que se considera necesario.   Si se requiere una autorizacin previa para que su compaa de seguros [place] su medicamento, por favor permtanos de 1 a 2 das hbiles para completar este proceso.  Los precios de los medicamentos varan con frecuencia dependiendo del Environmental consultant de dnde se surte la receta y alguna farmacias pueden ofrecer precios ms baratos.  El sitio web www.goodrx.com tiene cupones para medicamentos de Airline pilot. Los precios aqu no tienen en cuenta lo que podra costar con la ayuda del seguro (puede ser ms barato con su seguro), pero el sitio web puede darle el precio si no utiliz Research scientist (physical sciences).  - Puede imprimir el cupn correspondiente y llevarlo con su receta a la farmacia.  - Tambin puede pasar por nuestra oficina durante el horario de atencin regular y Charity fundraiser una tarjeta de cupones de GoodRx.  - Si necesita  Si necesita que su receta se enve electrnicamente a una farmacia diferente, informe a nuestra oficina a travs de MyChart de  o por telfono llamando al [phone] y presione la opcin 4.  

## 2022-06-28 DIAGNOSIS — Z7901 Long term (current) use of anticoagulants: Secondary | ICD-10-CM | POA: Diagnosis not present

## 2022-06-29 ENCOUNTER — Encounter: Payer: Self-pay | Admitting: Dermatology

## 2022-07-08 DIAGNOSIS — Z7901 Long term (current) use of anticoagulants: Secondary | ICD-10-CM | POA: Diagnosis not present

## 2022-07-08 DIAGNOSIS — R0602 Shortness of breath: Secondary | ICD-10-CM | POA: Diagnosis not present

## 2022-07-08 DIAGNOSIS — Z9989 Dependence on other enabling machines and devices: Secondary | ICD-10-CM | POA: Diagnosis not present

## 2022-07-08 DIAGNOSIS — G4733 Obstructive sleep apnea (adult) (pediatric): Secondary | ICD-10-CM | POA: Diagnosis not present

## 2022-07-08 DIAGNOSIS — I10 Essential (primary) hypertension: Secondary | ICD-10-CM | POA: Diagnosis not present

## 2022-07-08 DIAGNOSIS — R001 Bradycardia, unspecified: Secondary | ICD-10-CM | POA: Diagnosis not present

## 2022-07-22 DIAGNOSIS — M25862 Other specified joint disorders, left knee: Secondary | ICD-10-CM | POA: Diagnosis not present

## 2022-07-22 DIAGNOSIS — M1711 Unilateral primary osteoarthritis, right knee: Secondary | ICD-10-CM | POA: Diagnosis not present

## 2022-07-22 DIAGNOSIS — M170 Bilateral primary osteoarthritis of knee: Secondary | ICD-10-CM | POA: Diagnosis not present

## 2022-07-22 DIAGNOSIS — E1140 Type 2 diabetes mellitus with diabetic neuropathy, unspecified: Secondary | ICD-10-CM | POA: Diagnosis not present

## 2022-07-22 DIAGNOSIS — Z6841 Body Mass Index (BMI) 40.0 and over, adult: Secondary | ICD-10-CM | POA: Diagnosis not present

## 2022-07-22 DIAGNOSIS — Z7901 Long term (current) use of anticoagulants: Secondary | ICD-10-CM | POA: Diagnosis not present

## 2022-07-22 DIAGNOSIS — M1712 Unilateral primary osteoarthritis, left knee: Secondary | ICD-10-CM | POA: Diagnosis not present

## 2022-07-24 DIAGNOSIS — G4733 Obstructive sleep apnea (adult) (pediatric): Secondary | ICD-10-CM | POA: Diagnosis not present

## 2022-07-27 DIAGNOSIS — J449 Chronic obstructive pulmonary disease, unspecified: Secondary | ICD-10-CM | POA: Diagnosis not present

## 2022-07-27 DIAGNOSIS — Z0000 Encounter for general adult medical examination without abnormal findings: Secondary | ICD-10-CM | POA: Diagnosis not present

## 2022-07-27 DIAGNOSIS — E1140 Type 2 diabetes mellitus with diabetic neuropathy, unspecified: Secondary | ICD-10-CM | POA: Diagnosis not present

## 2022-07-27 DIAGNOSIS — K219 Gastro-esophageal reflux disease without esophagitis: Secondary | ICD-10-CM | POA: Diagnosis not present

## 2022-07-27 DIAGNOSIS — F419 Anxiety disorder, unspecified: Secondary | ICD-10-CM | POA: Diagnosis not present

## 2022-07-27 DIAGNOSIS — I10 Essential (primary) hypertension: Secondary | ICD-10-CM | POA: Diagnosis not present

## 2022-07-27 DIAGNOSIS — F3341 Major depressive disorder, recurrent, in partial remission: Secondary | ICD-10-CM | POA: Diagnosis not present

## 2022-07-27 DIAGNOSIS — E7800 Pure hypercholesterolemia, unspecified: Secondary | ICD-10-CM | POA: Diagnosis not present

## 2022-07-27 DIAGNOSIS — M1A00X0 Idiopathic chronic gout, unspecified site, without tophus (tophi): Secondary | ICD-10-CM | POA: Diagnosis not present

## 2022-07-27 DIAGNOSIS — I4891 Unspecified atrial fibrillation: Secondary | ICD-10-CM | POA: Diagnosis not present

## 2022-08-10 DIAGNOSIS — M5416 Radiculopathy, lumbar region: Secondary | ICD-10-CM | POA: Diagnosis not present

## 2022-08-19 DIAGNOSIS — Z7901 Long term (current) use of anticoagulants: Secondary | ICD-10-CM | POA: Diagnosis not present

## 2022-08-30 DIAGNOSIS — Z7901 Long term (current) use of anticoagulants: Secondary | ICD-10-CM | POA: Diagnosis not present

## 2022-08-31 DIAGNOSIS — M48062 Spinal stenosis, lumbar region with neurogenic claudication: Secondary | ICD-10-CM | POA: Diagnosis not present

## 2022-08-31 DIAGNOSIS — M5416 Radiculopathy, lumbar region: Secondary | ICD-10-CM | POA: Diagnosis not present

## 2022-09-06 DIAGNOSIS — I4891 Unspecified atrial fibrillation: Secondary | ICD-10-CM | POA: Diagnosis not present

## 2022-09-06 DIAGNOSIS — M129 Arthropathy, unspecified: Secondary | ICD-10-CM | POA: Diagnosis not present

## 2022-09-06 DIAGNOSIS — F3341 Major depressive disorder, recurrent, in partial remission: Secondary | ICD-10-CM | POA: Diagnosis not present

## 2022-09-06 DIAGNOSIS — E1140 Type 2 diabetes mellitus with diabetic neuropathy, unspecified: Secondary | ICD-10-CM | POA: Diagnosis not present

## 2022-09-06 DIAGNOSIS — I10 Essential (primary) hypertension: Secondary | ICD-10-CM | POA: Diagnosis not present

## 2022-09-14 ENCOUNTER — Ambulatory Visit: Payer: Medicare HMO | Admitting: Dermatology

## 2022-09-16 DIAGNOSIS — Z7901 Long term (current) use of anticoagulants: Secondary | ICD-10-CM | POA: Diagnosis not present

## 2022-09-16 DIAGNOSIS — E1140 Type 2 diabetes mellitus with diabetic neuropathy, unspecified: Secondary | ICD-10-CM | POA: Diagnosis not present

## 2022-09-16 DIAGNOSIS — E7800 Pure hypercholesterolemia, unspecified: Secondary | ICD-10-CM | POA: Diagnosis not present

## 2022-10-14 DIAGNOSIS — M5416 Radiculopathy, lumbar region: Secondary | ICD-10-CM | POA: Diagnosis not present

## 2022-10-14 DIAGNOSIS — M5136 Other intervertebral disc degeneration, lumbar region: Secondary | ICD-10-CM | POA: Diagnosis not present

## 2022-10-14 DIAGNOSIS — Z79899 Other long term (current) drug therapy: Secondary | ICD-10-CM | POA: Diagnosis not present

## 2022-10-14 DIAGNOSIS — M62830 Muscle spasm of back: Secondary | ICD-10-CM | POA: Diagnosis not present

## 2022-10-14 DIAGNOSIS — M1712 Unilateral primary osteoarthritis, left knee: Secondary | ICD-10-CM | POA: Diagnosis not present

## 2022-10-14 DIAGNOSIS — M5126 Other intervertebral disc displacement, lumbar region: Secondary | ICD-10-CM | POA: Diagnosis not present

## 2022-10-20 DIAGNOSIS — Z7901 Long term (current) use of anticoagulants: Secondary | ICD-10-CM | POA: Diagnosis not present

## 2022-10-20 DIAGNOSIS — Z23 Encounter for immunization: Secondary | ICD-10-CM | POA: Diagnosis not present

## 2022-10-25 ENCOUNTER — Telehealth: Payer: Self-pay | Admitting: *Deleted

## 2022-10-25 NOTE — Patient Outreach (Signed)
  Care Coordination   Initial Visit Note   10/25/2022 Name: Adrian [name] MRN: [id number] DOB: 1946-03-04  Adrian [name] is a 76 y.o. year old male who sees Feldpausch, Chrissie Noa, MD for primary care. I spoke with  Adrian [name] by phone today.  What matters to the patients health and wellness today?  No health concerns voiced. RN discussed [place], RN, SW, and Pharmacist. Patient declined services.     Goals Addressed             This Visit's Progress    Advised patient to follow up with AWV and Vaccines          SDOH assessments and interventions completed:  Yes  SDOH Interventions Today    Flowsheet Row Most Recent Value  SDOH Interventions   Food Insecurity Interventions Intervention Not Indicated  Housing Interventions Intervention Not Indicated  Transportation Interventions Intervention Not Indicated        Care Coordination Interventions Activated:  Yes  Care Coordination Interventions:  Yes, provided Care coordination program/ services discussed  Social determinants of health survey completed Annual wellness visit discussed and patient advised to contact provider office to schedule Patient advised to contact primary care provider office  if care coordination services needed in the future  Follow up plan: No further intervention required.   Encounter Outcome:  Pt. Visit Completed   [place] [facility] [phone]

## 2022-11-03 DIAGNOSIS — Z03818 Encounter for observation for suspected exposure to other biological agents ruled out: Secondary | ICD-10-CM | POA: Diagnosis not present

## 2022-11-03 DIAGNOSIS — R110 Nausea: Secondary | ICD-10-CM | POA: Diagnosis not present

## 2022-11-03 DIAGNOSIS — J069 Acute upper respiratory infection, unspecified: Secondary | ICD-10-CM | POA: Diagnosis not present

## 2022-11-03 DIAGNOSIS — R5383 Other fatigue: Secondary | ICD-10-CM | POA: Diagnosis not present

## 2022-11-17 DIAGNOSIS — Z7901 Long term (current) use of anticoagulants: Secondary | ICD-10-CM | POA: Diagnosis not present

## 2022-11-18 DIAGNOSIS — M129 Arthropathy, unspecified: Secondary | ICD-10-CM | POA: Diagnosis not present

## 2022-11-18 DIAGNOSIS — E1140 Type 2 diabetes mellitus with diabetic neuropathy, unspecified: Secondary | ICD-10-CM | POA: Diagnosis not present

## 2022-11-18 DIAGNOSIS — I4891 Unspecified atrial fibrillation: Secondary | ICD-10-CM | POA: Diagnosis not present

## 2022-11-18 DIAGNOSIS — F3341 Major depressive disorder, recurrent, in partial remission: Secondary | ICD-10-CM | POA: Diagnosis not present

## 2022-11-22 DIAGNOSIS — J439 Emphysema, unspecified: Secondary | ICD-10-CM | POA: Diagnosis not present

## 2022-11-22 DIAGNOSIS — R5383 Other fatigue: Secondary | ICD-10-CM | POA: Diagnosis not present

## 2022-11-22 DIAGNOSIS — J9811 Atelectasis: Secondary | ICD-10-CM | POA: Diagnosis not present

## 2022-11-22 DIAGNOSIS — R0602 Shortness of breath: Secondary | ICD-10-CM | POA: Diagnosis not present

## 2022-11-22 DIAGNOSIS — M25511 Pain in right shoulder: Secondary | ICD-10-CM | POA: Diagnosis not present

## 2022-11-23 DIAGNOSIS — R5383 Other fatigue: Secondary | ICD-10-CM | POA: Diagnosis not present

## 2022-12-06 DIAGNOSIS — E1140 Type 2 diabetes mellitus with diabetic neuropathy, unspecified: Secondary | ICD-10-CM | POA: Diagnosis not present

## 2022-12-06 DIAGNOSIS — M129 Arthropathy, unspecified: Secondary | ICD-10-CM | POA: Diagnosis not present

## 2022-12-06 DIAGNOSIS — I4891 Unspecified atrial fibrillation: Secondary | ICD-10-CM | POA: Diagnosis not present

## 2022-12-06 DIAGNOSIS — F3341 Major depressive disorder, recurrent, in partial remission: Secondary | ICD-10-CM | POA: Diagnosis not present

## 2022-12-07 DIAGNOSIS — M48062 Spinal stenosis, lumbar region with neurogenic claudication: Secondary | ICD-10-CM | POA: Diagnosis not present

## 2022-12-09 ENCOUNTER — Other Ambulatory Visit: Payer: Self-pay | Admitting: Orthopedic Surgery

## 2022-12-09 ENCOUNTER — Ambulatory Visit: Payer: Medicare HMO | Admitting: Dermatology

## 2022-12-09 DIAGNOSIS — M545 Low back pain, unspecified: Secondary | ICD-10-CM

## 2022-12-09 DIAGNOSIS — Z1283 Encounter for screening for malignant neoplasm of skin: Secondary | ICD-10-CM

## 2022-12-09 DIAGNOSIS — Z85828 Personal history of other malignant neoplasm of skin: Secondary | ICD-10-CM

## 2022-12-09 DIAGNOSIS — D229 Melanocytic nevi, unspecified: Secondary | ICD-10-CM

## 2022-12-09 DIAGNOSIS — L820 Inflamed seborrheic keratosis: Secondary | ICD-10-CM

## 2022-12-09 DIAGNOSIS — L578 Other skin changes due to chronic exposure to nonionizing radiation: Secondary | ICD-10-CM | POA: Diagnosis not present

## 2022-12-09 DIAGNOSIS — L111 Transient acantholytic dermatosis [Grover]: Secondary | ICD-10-CM

## 2022-12-09 DIAGNOSIS — L918 Other hypertrophic disorders of the skin: Secondary | ICD-10-CM | POA: Diagnosis not present

## 2022-12-09 DIAGNOSIS — Z79899 Other long term (current) drug therapy: Secondary | ICD-10-CM

## 2022-12-09 DIAGNOSIS — L821 Other seborrheic keratosis: Secondary | ICD-10-CM

## 2022-12-09 DIAGNOSIS — L814 Other melanin hyperpigmentation: Secondary | ICD-10-CM | POA: Diagnosis not present

## 2022-12-09 DIAGNOSIS — Z86018 Personal history of other benign neoplasm: Secondary | ICD-10-CM | POA: Diagnosis not present

## 2022-12-09 MED ORDER — MOMETASONE FUROATE 0.1 % EX CREA: 1.0000 | TOPICAL_CREAM | CUTANEOUS | 1 refills | Status: DC

## 2022-12-09 NOTE — Patient Instructions (Addendum)
Cryotherapy Aftercare  Wash gently with soap and water everyday.   Apply Vaseline and Band-Aid daily until healed.     Due to recent changes in healthcare laws, you may see results of your pathology and/or laboratory studies on MyChart before the doctors have had a chance to review them. We understand that in some cases there may be results that are confusing or concerning to you. Please understand that not all results are received at the same time and often the doctors may need to interpret multiple results in order to provide you with the best plan of care or course of treatment. Therefore, we ask that you please give us 2 business days to thoroughly review all your results before contacting the office for clarification. Should we see a critical lab result, you will be contacted sooner.   If You Need Anything After Your Visit  If you have any questions or concerns for your doctor, please call our main line at [phone] and press option 4 to reach your doctor's medical assistant. If no one answers, please leave a voicemail as directed and we will return your call as soon as possible. Messages left after 4 pm will be answered the following business day.   You may also send us a message via MyChart. We typically respond to MyChart messages within 1-2 business days.  For prescription refills, please ask your pharmacy to contact our office. Our fax number is [phone].  If you have an urgent issue when the clinic is closed that cannot wait until the next business day, you can page your doctor at the number below.    Please note that while we do our best to be available for urgent issues outside of office hours, we are not available 24/7.   If you have an urgent issue and are unable to reach us, you may choose to seek medical care at your doctor's office, retail clinic, urgent care center, or emergency room.  If you have a medical emergency, please immediately call 911 or go to the  emergency department.  Pager Numbers  - Dr. Kowalski: [phone]  - Dr. Moye: [phone]  - Dr. Stewart: [phone]  In the event of inclement weather, please call our main line at [phone] for an update on the status of any delays or closures.  Dermatology Medication Tips: Please keep the boxes that topical medications come in in order to help keep track of the instructions about where and how to use these. Pharmacies typically print the medication instructions only on the boxes and not directly on the medication tubes.   If your medication is too expensive, please contact our office at [phone] option 4 or send us a message through MyChart.   We are unable to tell what your co-pay for medications will be in advance as this is different depending on your insurance coverage. However, we may be able to find a substitute medication at lower cost or fill out paperwork to get insurance to cover a needed medication.   If a prior authorization is required to get your medication covered by your insurance company, please allow us 1-2 business days to complete this process.  Drug prices often vary depending on where the prescription is filled and some pharmacies may offer cheaper prices.  The website www.goodrx.com contains coupons for medications through different pharmacies. The prices here do not account for what the cost may be with help from insurance (it may be cheaper with your insurance), but the website can   give you the price if you did not use any insurance.  - You can print the associated coupon and take it with your prescription to the pharmacy.  - You may also stop by our office during regular business hours and pick up a GoodRx coupon card.  - If you need your prescription sent electronically to a different pharmacy, notify our office through [place] MyChart or by phone at [phone] option 4.     Si Usted Necesita Algo Despus de Su Visita  Tambin puede  enviarnos un mensaje a travs de MyChart. Por lo general respondemos a los mensajes de MyChart en el transcurso de 1 a 2 das hbiles.  Para renovar recetas, por favor pida a su farmacia que se ponga en contacto con nuestra oficina. Nuestro nmero de fax es el [phone].  Si tiene un asunto urgente cuando la clnica est cerrada y que no puede esperar hasta el siguiente da hbil, puede llamar/localizar a su doctor(a) al nmero que aparece a continuacin.   Por favor, tenga en cuenta que aunque hacemos todo lo posible para estar disponibles para asuntos urgentes fuera del horario de oficina, no estamos disponibles las 24 horas del da, los 7 das de la semana.   Si tiene un problema urgente y no puede comunicarse con nosotros, puede optar por buscar atencin mdica  en el consultorio de su doctor(a), en una clnica privada, en un centro de atencin urgente o en una sala de emergencias.  Si tiene una emergencia mdica, por favor llame inmediatamente al 911 o vaya a la sala de emergencias.  Nmeros de bper  - Dr. Kowalski: [phone]  - Dra. Moye: [phone]  - Dra. Stewart: [phone]  En caso de inclemencias del tiempo, por favor llame a nuestra lnea principal al [phone] para una actualizacin sobre el estado de cualquier retraso o cierre.  Consejos para la medicacin en dermatologa: Por favor, guarde las cajas en las que vienen los medicamentos de uso tpico para ayudarle a seguir las instrucciones sobre dnde y cmo usarlos. Las farmacias generalmente imprimen las instrucciones del medicamento slo en las cajas y no directamente en los tubos del medicamento.   Si su medicamento es muy caro, por favor, pngase en contacto con nuestra oficina llamando al [phone] y presione la opcin 4 o envenos un mensaje a travs de MyChart.   No podemos decirle cul ser su copago por los medicamentos por adelantado ya que esto es diferente dependiendo de la cobertura de su seguro.  Sin embargo, es posible que podamos encontrar un medicamento sustituto a menor costo o llenar un formulario para que el seguro cubra el medicamento que se considera necesario.   Si se requiere una autorizacin previa para que su compaa de seguros cubra su medicamento, por favor permtanos de 1 a 2 das hbiles para completar este proceso.  Los precios de los medicamentos varan con frecuencia dependiendo del lugar de dnde se surte la receta y alguna farmacias pueden ofrecer precios ms baratos.  El sitio web www.goodrx.com tiene cupones para medicamentos de diferentes farmacias. Los precios aqu no tienen en cuenta lo que podra costar con la ayuda del seguro (puede ser ms barato con su seguro), pero el sitio web puede darle el precio si no utiliz ningn seguro.  - Puede imprimir el cupn correspondiente y llevarlo con su receta a la farmacia.  - Tambin puede pasar por nuestra oficina durante el horario de atencin regular y recoger una tarjeta de cupones de GoodRx.  -   Si necesita que su receta se enve electrnicamente a una farmacia diferente, informe a nuestra oficina a travs de MyChart de [place] o por telfono llamando al [phone] y presione la opcin 4.  

## 2022-12-09 NOTE — Progress Notes (Signed)
Follow-Up Visit   Subjective  Adrian [name] is a 76 y.o. male who presents for the following: UBSE (Hx of BCCs/), ISk f/u (Neck, pt would like more removed), and Rash (Chest, 62m itchy). The patient presents for Upper Body Skin Exam (UBSE) for skin cancer screening and mole check.  The patient has spots, moles and lesions to be evaluated, some may be new or changing and the patient has concerns that these could be cancer.   The following portions of the chart were reviewed this encounter and updated as appropriate:   Tobacco  Allergies  Meds  Problems  Med Hx  Surg Hx  Fam Hx     Review of Systems:  No other skin or systemic complaints except as noted in HPI or Assessment and Plan.  Objective  Well appearing patient in no apparent distress; mood and affect are within normal limits.  All skin waist up examined.  chest, back arms x 22 (22) Stuck on waxy paps with erythema  R neck x 3, R axilla x 2 (5) Fleshy, skin-colored pedunculated papules.    chest Paps chest   Assessment & Plan   Lentigines - Scattered tan macules - Due to sun exposure - Benign-appearing, observe - Recommend daily broad spectrum sunscreen SPF 30+ to sun-exposed areas, reapply every 2 hours as needed. - Call for any changes  Seborrheic Keratoses - Stuck-on, waxy, tan-brown papules and/or plaques  - Benign-appearing - Discussed benign etiology and prognosis. - Observe - Call for any changes  Melanocytic Nevi - Tan-brown and/or pink-flesh-colored symmetric macules and papules - Benign appearing on exam today - Observation - Call clinic for new or changing moles - Recommend daily use of broad spectrum spf 30+ sunscreen to sun-exposed areas.   Hemangiomas - Red papules - Discussed benign nature - Observe - Call for any changes  Actinic Damage - Chronic condition, secondary to cumulative UV/sun exposure - diffuse scaly erythematous macules with underlying dyspigmentation - Recommend  daily broad spectrum sunscreen SPF 30+ to sun-exposed areas, reapply every 2 hours as needed.  - Staying in the shade or wearing long sleeves, sun glasses (UVA+UVB protection) and wide brim hats (4-inch brim around the entire circumference of the hat) are also recommended for sun protection.  - Call for new or changing lesions.  Skin cancer screening performed today.   History of Basal Cell Carcinoma of the Skin - No evidence of recurrence today - Recommend regular full body skin exams - Recommend daily broad spectrum sunscreen SPF 30+ to sun-exposed areas, reapply every 2 hours as needed.  - Call if any new or changing lesions are noted between office visits  - L nasolabial fold, L ear sup to tragus, R tragus, R lower lip  History of Dysplastic Nevi - No evidence of recurrence today - Recommend regular full body skin exams - Recommend daily broad spectrum sunscreen SPF 30+ to sun-exposed areas, reapply every 2 hours as needed.  - Call if any new or changing lesions are noted between office visits  - R mid back paraspinal  Inflamed seborrheic keratosis (22) chest, back arms x 22 Symptomatic, irritating, patient would like treated. Destruction of lesion - chest, back arms x 22 Complexity: simple   Destruction method: cryotherapy   Informed consent: discussed and consent obtained   Timeout:  patient name, date of birth, surgical site, and procedure verified Lesion destroyed using liquid nitrogen: Yes   Region frozen until ice ball extended beyond lesion: Yes   Outcome:  patient tolerated procedure well with no complications   Post-procedure details: wound care instructions given    Skin tag (5) R neck x 3, R axilla x 2 Symptomatic, irritating, patient would like treated. 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  It more commonly presents in the winter and can be associated with atopic dermatitis, renal failure, transplantation and malignancies. It is very difficult to treat and can be persistent and recurrent but topical steroids, calcipotriene cream, antihistamines can be helpful.  For recalcitrant disease, other treatments have been used such as isotretinoin, acitretin, systemic steroids, and Dupixent.  mometasone (ELOCON) 0.1 % cream - chest Apply 1 Application topically as directed. Qd up to 5 days a week to aa rash on chest prn itchy flares  Return in about 6 months (around 06/10/2023) for TBSE, Hx of BCC, Hx of Dysplastic nevi.  I, Adrian [name], RMA, am acting as scribe for Sarina Ser, MD .  Documentation: I have reviewed the above documentation for accuracy and completeness, and I agree with the above.  Sarina Ser, MD

## 2022-12-18 ENCOUNTER — Encounter: Payer: Self-pay | Admitting: Dermatology

## 2022-12-23 DIAGNOSIS — Z7901 Long term (current) use of anticoagulants: Secondary | ICD-10-CM | POA: Diagnosis not present

## 2022-12-24 DIAGNOSIS — M5416 Radiculopathy, lumbar region: Secondary | ICD-10-CM | POA: Diagnosis not present

## 2022-12-24 DIAGNOSIS — M5126 Other intervertebral disc displacement, lumbar region: Secondary | ICD-10-CM | POA: Diagnosis not present

## 2023-01-18 DIAGNOSIS — M47816 Spondylosis without myelopathy or radiculopathy, lumbar region: Secondary | ICD-10-CM | POA: Diagnosis not present

## 2023-01-18 DIAGNOSIS — M7918 Myalgia, other site: Secondary | ICD-10-CM | POA: Diagnosis not present

## 2023-01-18 DIAGNOSIS — M159 Polyosteoarthritis, unspecified: Secondary | ICD-10-CM | POA: Diagnosis not present

## 2023-01-25 DIAGNOSIS — Z7901 Long term (current) use of anticoagulants: Secondary | ICD-10-CM | POA: Diagnosis not present

## 2023-01-27 DIAGNOSIS — M1A00X0 Idiopathic chronic gout, unspecified site, without tophus (tophi): Secondary | ICD-10-CM | POA: Diagnosis not present

## 2023-01-27 DIAGNOSIS — J449 Chronic obstructive pulmonary disease, unspecified: Secondary | ICD-10-CM | POA: Diagnosis not present

## 2023-01-27 DIAGNOSIS — F419 Anxiety disorder, unspecified: Secondary | ICD-10-CM | POA: Diagnosis not present

## 2023-01-27 DIAGNOSIS — F3341 Major depressive disorder, recurrent, in partial remission: Secondary | ICD-10-CM | POA: Diagnosis not present

## 2023-01-27 DIAGNOSIS — I4891 Unspecified atrial fibrillation: Secondary | ICD-10-CM | POA: Diagnosis not present

## 2023-01-27 DIAGNOSIS — E1140 Type 2 diabetes mellitus with diabetic neuropathy, unspecified: Secondary | ICD-10-CM | POA: Diagnosis not present

## 2023-01-27 DIAGNOSIS — E7800 Pure hypercholesterolemia, unspecified: Secondary | ICD-10-CM | POA: Diagnosis not present

## 2023-01-27 DIAGNOSIS — I10 Essential (primary) hypertension: Secondary | ICD-10-CM | POA: Diagnosis not present

## 2023-01-31 ENCOUNTER — Other Ambulatory Visit: Payer: Medicare HMO

## 2023-02-01 DIAGNOSIS — E1140 Type 2 diabetes mellitus with diabetic neuropathy, unspecified: Secondary | ICD-10-CM | POA: Diagnosis not present

## 2023-02-01 DIAGNOSIS — Z7901 Long term (current) use of anticoagulants: Secondary | ICD-10-CM | POA: Diagnosis not present

## 2023-02-01 DIAGNOSIS — I10 Essential (primary) hypertension: Secondary | ICD-10-CM | POA: Diagnosis not present

## 2023-02-01 DIAGNOSIS — Z125 Encounter for screening for malignant neoplasm of prostate: Secondary | ICD-10-CM | POA: Diagnosis not present

## 2023-02-01 DIAGNOSIS — R350 Frequency of micturition: Secondary | ICD-10-CM | POA: Diagnosis not present

## 2023-02-01 DIAGNOSIS — E7800 Pure hypercholesterolemia, unspecified: Secondary | ICD-10-CM | POA: Diagnosis not present

## 2023-02-01 DIAGNOSIS — N401 Enlarged prostate with lower urinary tract symptoms: Secondary | ICD-10-CM | POA: Diagnosis not present

## 2023-02-03 DIAGNOSIS — M5416 Radiculopathy, lumbar region: Secondary | ICD-10-CM | POA: Diagnosis not present

## 2023-02-03 DIAGNOSIS — M48062 Spinal stenosis, lumbar region with neurogenic claudication: Secondary | ICD-10-CM | POA: Diagnosis not present

## 2023-02-03 DIAGNOSIS — M47896 Other spondylosis, lumbar region: Secondary | ICD-10-CM | POA: Diagnosis not present

## 2023-02-03 DIAGNOSIS — M25562 Pain in left knee: Secondary | ICD-10-CM | POA: Diagnosis not present

## 2023-02-03 DIAGNOSIS — M545 Low back pain, unspecified: Secondary | ICD-10-CM | POA: Diagnosis not present

## 2023-02-03 DIAGNOSIS — Z96649 Presence of unspecified artificial hip joint: Secondary | ICD-10-CM | POA: Diagnosis not present

## 2023-02-08 DIAGNOSIS — F3341 Major depressive disorder, recurrent, in partial remission: Secondary | ICD-10-CM | POA: Diagnosis not present

## 2023-02-08 DIAGNOSIS — E1140 Type 2 diabetes mellitus with diabetic neuropathy, unspecified: Secondary | ICD-10-CM | POA: Diagnosis not present

## 2023-02-08 DIAGNOSIS — I4891 Unspecified atrial fibrillation: Secondary | ICD-10-CM | POA: Diagnosis not present

## 2023-02-08 DIAGNOSIS — M129 Arthropathy, unspecified: Secondary | ICD-10-CM | POA: Diagnosis not present

## 2023-02-14 DIAGNOSIS — M5416 Radiculopathy, lumbar region: Secondary | ICD-10-CM | POA: Diagnosis not present

## 2023-02-14 DIAGNOSIS — G629 Polyneuropathy, unspecified: Secondary | ICD-10-CM | POA: Diagnosis not present

## 2023-02-14 DIAGNOSIS — M5126 Other intervertebral disc displacement, lumbar region: Secondary | ICD-10-CM | POA: Diagnosis not present

## 2023-02-14 DIAGNOSIS — M48062 Spinal stenosis, lumbar region with neurogenic claudication: Secondary | ICD-10-CM | POA: Diagnosis not present

## 2023-02-23 DIAGNOSIS — Z7901 Long term (current) use of anticoagulants: Secondary | ICD-10-CM | POA: Diagnosis not present

## 2023-02-23 DIAGNOSIS — G629 Polyneuropathy, unspecified: Secondary | ICD-10-CM | POA: Diagnosis not present

## 2023-02-23 DIAGNOSIS — R2689 Other abnormalities of gait and mobility: Secondary | ICD-10-CM | POA: Diagnosis not present

## 2023-02-23 DIAGNOSIS — R29810 Facial weakness: Secondary | ICD-10-CM | POA: Diagnosis not present

## 2023-02-23 DIAGNOSIS — Z86718 Personal history of other venous thrombosis and embolism: Secondary | ICD-10-CM | POA: Diagnosis not present

## 2023-02-28 DIAGNOSIS — R0602 Shortness of breath: Secondary | ICD-10-CM | POA: Diagnosis not present

## 2023-02-28 DIAGNOSIS — R2689 Other abnormalities of gait and mobility: Secondary | ICD-10-CM | POA: Diagnosis not present

## 2023-03-01 ENCOUNTER — Other Ambulatory Visit: Payer: Self-pay | Admitting: Neurology

## 2023-03-01 DIAGNOSIS — R29810 Facial weakness: Secondary | ICD-10-CM

## 2023-03-03 ENCOUNTER — Other Ambulatory Visit: Payer: Medicare HMO

## 2023-03-07 ENCOUNTER — Ambulatory Visit
Admission: RE | Admit: 2023-03-07 | Discharge: 2023-03-07 | Disposition: A | Discharge status: Home / Self Care | Payer: Medicare HMO | Source: Ambulatory Visit | Attending: Neurology | Admitting: Neurology

## 2023-03-07 DIAGNOSIS — R29810 Facial weakness: Secondary | ICD-10-CM | POA: Insufficient documentation

## 2023-03-07 DIAGNOSIS — R531 Weakness: Secondary | ICD-10-CM | POA: Diagnosis not present

## 2023-03-07 DIAGNOSIS — R29898 Other symptoms and signs involving the musculoskeletal system: Secondary | ICD-10-CM | POA: Diagnosis not present

## 2023-03-09 DIAGNOSIS — Z7901 Long term (current) use of anticoagulants: Secondary | ICD-10-CM | POA: Diagnosis not present

## 2023-03-09 DIAGNOSIS — M7062 Trochanteric bursitis, left hip: Secondary | ICD-10-CM | POA: Diagnosis not present

## 2023-03-09 DIAGNOSIS — Z6841 Body Mass Index (BMI) 40.0 and over, adult: Secondary | ICD-10-CM | POA: Diagnosis not present

## 2023-03-09 DIAGNOSIS — G8929 Other chronic pain: Secondary | ICD-10-CM | POA: Diagnosis not present

## 2023-03-09 DIAGNOSIS — E1140 Type 2 diabetes mellitus with diabetic neuropathy, unspecified: Secondary | ICD-10-CM | POA: Diagnosis not present

## 2023-03-25 ENCOUNTER — Ambulatory Visit
Admission: RE | Admit: 2023-03-25 | Discharge: 2023-03-25 | Disposition: A | Discharge status: Home / Self Care | Payer: Medicare HMO | Source: Ambulatory Visit | Attending: Orthopedic Surgery | Admitting: Orthopedic Surgery

## 2023-03-25 DIAGNOSIS — J3489 Other specified disorders of nose and nasal sinuses: Secondary | ICD-10-CM | POA: Diagnosis not present

## 2023-03-25 DIAGNOSIS — M48061 Spinal stenosis, lumbar region without neurogenic claudication: Secondary | ICD-10-CM | POA: Diagnosis not present

## 2023-03-25 DIAGNOSIS — M545 Low back pain, unspecified: Secondary | ICD-10-CM

## 2023-03-25 DIAGNOSIS — J449 Chronic obstructive pulmonary disease, unspecified: Secondary | ICD-10-CM | POA: Diagnosis not present

## 2023-03-25 DIAGNOSIS — Z7901 Long term (current) use of anticoagulants: Secondary | ICD-10-CM | POA: Diagnosis not present

## 2023-03-25 DIAGNOSIS — R200 Anesthesia of skin: Secondary | ICD-10-CM | POA: Diagnosis not present

## 2023-03-28 DIAGNOSIS — M5416 Radiculopathy, lumbar region: Secondary | ICD-10-CM | POA: Diagnosis not present

## 2023-04-04 DIAGNOSIS — R051 Acute cough: Secondary | ICD-10-CM | POA: Diagnosis not present

## 2023-04-04 DIAGNOSIS — R0989 Other specified symptoms and signs involving the circulatory and respiratory systems: Secondary | ICD-10-CM | POA: Diagnosis not present

## 2023-04-04 DIAGNOSIS — J302 Other seasonal allergic rhinitis: Secondary | ICD-10-CM | POA: Diagnosis not present

## 2023-04-04 DIAGNOSIS — R0603 Acute respiratory distress: Secondary | ICD-10-CM | POA: Diagnosis not present

## 2023-04-05 DIAGNOSIS — E1140 Type 2 diabetes mellitus with diabetic neuropathy, unspecified: Secondary | ICD-10-CM | POA: Diagnosis not present

## 2023-04-05 DIAGNOSIS — I4891 Unspecified atrial fibrillation: Secondary | ICD-10-CM | POA: Diagnosis not present

## 2023-04-05 DIAGNOSIS — M129 Arthropathy, unspecified: Secondary | ICD-10-CM | POA: Diagnosis not present

## 2023-04-05 DIAGNOSIS — F3341 Major depressive disorder, recurrent, in partial remission: Secondary | ICD-10-CM | POA: Diagnosis not present

## 2023-04-13 DIAGNOSIS — E119 Type 2 diabetes mellitus without complications: Secondary | ICD-10-CM | POA: Diagnosis not present

## 2023-04-13 DIAGNOSIS — H2513 Age-related nuclear cataract, bilateral: Secondary | ICD-10-CM | POA: Diagnosis not present

## 2023-04-13 DIAGNOSIS — Z0100 Encounter for examination of eyes and vision without abnormal findings: Secondary | ICD-10-CM | POA: Diagnosis not present

## 2023-04-18 DIAGNOSIS — M1712 Unilateral primary osteoarthritis, left knee: Secondary | ICD-10-CM | POA: Diagnosis not present

## 2023-04-18 DIAGNOSIS — M5416 Radiculopathy, lumbar region: Secondary | ICD-10-CM | POA: Diagnosis not present

## 2023-04-18 DIAGNOSIS — Z7901 Long term (current) use of anticoagulants: Secondary | ICD-10-CM | POA: Diagnosis not present

## 2023-04-18 DIAGNOSIS — M62830 Muscle spasm of back: Secondary | ICD-10-CM | POA: Diagnosis not present

## 2023-04-18 DIAGNOSIS — Z79899 Other long term (current) drug therapy: Secondary | ICD-10-CM | POA: Diagnosis not present

## 2023-04-18 DIAGNOSIS — M48062 Spinal stenosis, lumbar region with neurogenic claudication: Secondary | ICD-10-CM | POA: Diagnosis not present

## 2023-04-18 DIAGNOSIS — M5126 Other intervertebral disc displacement, lumbar region: Secondary | ICD-10-CM | POA: Diagnosis not present

## 2023-04-25 DIAGNOSIS — Z7901 Long term (current) use of anticoagulants: Secondary | ICD-10-CM | POA: Diagnosis not present

## 2023-04-26 DIAGNOSIS — M5416 Radiculopathy, lumbar region: Secondary | ICD-10-CM | POA: Diagnosis not present

## 2023-04-26 DIAGNOSIS — M5126 Other intervertebral disc displacement, lumbar region: Secondary | ICD-10-CM | POA: Diagnosis not present

## 2023-04-26 DIAGNOSIS — M48062 Spinal stenosis, lumbar region with neurogenic claudication: Secondary | ICD-10-CM | POA: Diagnosis not present

## 2023-05-19 DIAGNOSIS — F3341 Major depressive disorder, recurrent, in partial remission: Secondary | ICD-10-CM | POA: Diagnosis not present

## 2023-05-19 DIAGNOSIS — M129 Arthropathy, unspecified: Secondary | ICD-10-CM | POA: Diagnosis not present

## 2023-05-19 DIAGNOSIS — E1140 Type 2 diabetes mellitus with diabetic neuropathy, unspecified: Secondary | ICD-10-CM | POA: Diagnosis not present

## 2023-05-19 DIAGNOSIS — I4891 Unspecified atrial fibrillation: Secondary | ICD-10-CM | POA: Diagnosis not present

## 2023-05-19 DIAGNOSIS — J449 Chronic obstructive pulmonary disease, unspecified: Secondary | ICD-10-CM | POA: Diagnosis not present

## 2023-05-19 DIAGNOSIS — F419 Anxiety disorder, unspecified: Secondary | ICD-10-CM | POA: Diagnosis not present

## 2023-05-23 DIAGNOSIS — M62830 Muscle spasm of back: Secondary | ICD-10-CM | POA: Diagnosis not present

## 2023-05-23 DIAGNOSIS — G629 Polyneuropathy, unspecified: Secondary | ICD-10-CM | POA: Diagnosis not present

## 2023-05-23 DIAGNOSIS — M5416 Radiculopathy, lumbar region: Secondary | ICD-10-CM | POA: Diagnosis not present

## 2023-05-23 DIAGNOSIS — Z79899 Other long term (current) drug therapy: Secondary | ICD-10-CM | POA: Diagnosis not present

## 2023-05-23 DIAGNOSIS — M48062 Spinal stenosis, lumbar region with neurogenic claudication: Secondary | ICD-10-CM | POA: Diagnosis not present

## 2023-05-23 DIAGNOSIS — M5126 Other intervertebral disc displacement, lumbar region: Secondary | ICD-10-CM | POA: Diagnosis not present

## 2023-05-24 DIAGNOSIS — Z7901 Long term (current) use of anticoagulants: Secondary | ICD-10-CM | POA: Diagnosis not present

## 2023-05-24 DIAGNOSIS — I4891 Unspecified atrial fibrillation: Secondary | ICD-10-CM | POA: Diagnosis not present

## 2023-06-09 ENCOUNTER — Ambulatory Visit: Payer: Medicare HMO | Admitting: Dermatology

## 2023-06-09 DIAGNOSIS — R29898 Other symptoms and signs involving the musculoskeletal system: Secondary | ICD-10-CM | POA: Diagnosis not present

## 2023-06-09 DIAGNOSIS — G629 Polyneuropathy, unspecified: Secondary | ICD-10-CM | POA: Diagnosis not present

## 2023-06-10 DIAGNOSIS — I10 Essential (primary) hypertension: Secondary | ICD-10-CM | POA: Diagnosis not present

## 2023-06-10 DIAGNOSIS — R609 Edema, unspecified: Secondary | ICD-10-CM | POA: Diagnosis not present

## 2023-06-10 DIAGNOSIS — R001 Bradycardia, unspecified: Secondary | ICD-10-CM | POA: Diagnosis not present

## 2023-06-10 DIAGNOSIS — Z6841 Body Mass Index (BMI) 40.0 and over, adult: Secondary | ICD-10-CM | POA: Diagnosis not present

## 2023-06-10 DIAGNOSIS — I4892 Unspecified atrial flutter: Secondary | ICD-10-CM | POA: Diagnosis not present

## 2023-06-10 DIAGNOSIS — I2510 Atherosclerotic heart disease of native coronary artery without angina pectoris: Secondary | ICD-10-CM | POA: Diagnosis not present

## 2023-06-10 DIAGNOSIS — R079 Chest pain, unspecified: Secondary | ICD-10-CM | POA: Diagnosis not present

## 2023-06-10 DIAGNOSIS — I4891 Unspecified atrial fibrillation: Secondary | ICD-10-CM | POA: Diagnosis not present

## 2023-07-04 DIAGNOSIS — E538 Deficiency of other specified B group vitamins: Secondary | ICD-10-CM | POA: Diagnosis not present

## 2023-07-20 ENCOUNTER — Ambulatory Visit: Payer: Medicare HMO | Admitting: Dermatology

## 2023-07-20 VITALS — BP 115/78

## 2023-07-20 DIAGNOSIS — L821 Other seborrheic keratosis: Secondary | ICD-10-CM | POA: Diagnosis not present

## 2023-07-20 DIAGNOSIS — W908XXA Exposure to other nonionizing radiation, initial encounter: Secondary | ICD-10-CM

## 2023-07-20 DIAGNOSIS — L578 Other skin changes due to chronic exposure to nonionizing radiation: Secondary | ICD-10-CM | POA: Diagnosis not present

## 2023-07-20 DIAGNOSIS — L820 Inflamed seborrheic keratosis: Secondary | ICD-10-CM | POA: Diagnosis not present

## 2023-07-20 NOTE — Patient Instructions (Addendum)
Cryotherapy Aftercare  Wash gently with soap and water everyday.   Apply Vaseline and Band-Aid daily until healed.    Seborrheic Keratosis  What causes seborrheic keratoses? Seborrheic keratoses are harmless, common skin growths that first appear during adult life.  As time goes by, more growths appear.  Some people may develop a large number of them.  Seborrheic keratoses appear on both covered and uncovered body parts.  They are not caused by sunlight.  The tendency to develop seborrheic keratoses can be inherited.  They vary in color from skin-colored to gray, brown, or even black.  They can be either smooth or have a rough, warty surface.   Seborrheic keratoses are superficial and look as if they were stuck on the skin.  Under the microscope this type of keratosis looks like layers upon layers of skin.  That is why at times the top layer may seem to fall off, but the rest of the growth remains and re-grows.    Treatment Seborrheic keratoses do not need to be treated, but can easily be removed in the office.  Seborrheic keratoses often cause symptoms when they rub on clothing or jewelry.  Lesions can be in the way of shaving.  If they become inflamed, they can cause itching, soreness, or burning.  Removal of a seborrheic keratosis can be accomplished by freezing, burning, or surgery. If any spot bleeds, scabs, or grows rapidly, please return to have it checked, as these can be an indication of a skin cancer.   Due to recent changes in healthcare laws, you may see results of your pathology and/or laboratory studies on MyChart before the doctors have had a chance to review them. We understand that in some cases there may be results that are confusing or concerning to you. Please understand that not all results are received at the same time and often the doctors may need to interpret multiple results in order to provide you with the best plan of care or course of treatment. Therefore, we ask that  you please give [place] 2 business days to thoroughly review all your results before contacting the office for clarification. Should we see a critical lab result, you will be contacted sooner.   If You Need Anything After Your Visit  If you have any questions or concerns for your doctor, please call our main line at [phone] and press option 4 to reach your doctor's medical assistant. If no one answers, please leave a voicemail as directed and we will return your call as soon as possible. Messages left after 4 pm will be answered the following business day.   You may also send [place] a message via MyChart. We typically respond to MyChart messages within 1-2 business days.  For prescription refills, please ask your pharmacy to contact our office. Our [name] number is [phone].  If you have an urgent issue when the clinic is closed that cannot wait until the next business day, you can page your doctor at the number below.    Please note that while we do our best to be available for urgent issues outside of office hours, we are not available 24/7.   If you have an urgent issue and are unable to reach [place], you may choose to seek medical care at your doctor's office, retail clinic, urgent care center, or emergency room.  If you have a medical emergency, please immediately call 911 or go to the emergency department.  Pager Numbers  - Dr. Gwen Pounds: [phone]  -  Dr. Roseanne Reno: [phone]  In the event of inclement weather, please call our main line at [phone] for an update on the status of any delays or closures.  Dermatology Medication Tips: Please keep the boxes that topical medications come in in order to help keep track of the instructions about where and how to use these. Pharmacies typically print the medication instructions only on the boxes and not directly on the medication tubes.   If your medication is too expensive, please contact our office at [phone] option 4 or send [place] a  message through MyChart.   We are unable to tell what your co-pay for medications will be in advance as this is different depending on your insurance coverage. However, we may be able to find a substitute medication at lower cost or fill out paperwork to get insurance to cover a needed medication.   If a prior authorization is required to get your medication covered by your insurance company, please allow [place] 1-2 business days to complete this process.  Drug prices often vary depending on where the prescription is filled and some pharmacies may offer cheaper prices.  The website www.goodrx.com contains coupons for medications through different pharmacies. The prices here do not account for what the cost may be with help from insurance (it may be cheaper with your insurance), but the website can give you the price if you did not use any insurance.  - You can print the associated coupon and take it with your prescription to the pharmacy.  - You may also stop by our office during regular business hours and pick up a GoodRx coupon card.  - If you need your prescription sent electronically to a different pharmacy, notify our office through [facility] or by phone at [phone] option 4.     Si Usted Necesita Algo Despus de Su Visita  Tambin puede enviarnos un mensaje a travs de Clinical cytogeneticist. Por lo general respondemos a los mensajes de MyChart en el transcurso de 1 a 2 das hbiles.  Para renovar recetas, por favor pida a su farmacia que se ponga en contacto con nuestra oficina. [name] es [place] [phone].  Si tiene un asunto urgente cuando la clnica est cerrada y que no puede esperar hasta el siguiente da hbil, puede llamar/localizar a su doctor(a) al nmero que aparece a continuacin.   Por favor, tenga en cuenta que aunque hacemos todo lo posible para estar disponibles para asuntos urgentes fuera del horario de [place], no estamos disponibles las 24 horas del da, los 7  [address]  [address] de la [place].   Si tiene un problema urgente y no puede comunicarse con nosotros, puede optar por buscar atencin mdica  en el consultorio de su doctor(a), en una clnica privada, en un centro de atencin urgente o en una sala de emergencias.  Si tiene Engineer, drilling, por favor llame inmediatamente al 911 o vaya a la sala de emergencias.  Nmeros de bper  - Dr. Gwen Pounds: [phone]  - Dra. Moye: [phone]  - Dra. Roseanne Reno: [phone]  En caso de inclemencias del [place], por favor llame a Lacy Duverney principal al [phone] para una actualizacin sobre el Portal de cualquier retraso o cierre.  Consejos para la medicacin en dermatologa: Por favor, guarde las cajas en las que vienen los medicamentos de uso tpico para ayudarle a seguir las instrucciones sobre dnde y cmo usarlos. Las farmacias generalmente imprimen las instrucciones del medicamento slo en las cajas y no directamente en los tubos  del medicamento.   Si su medicamento es muy caro, por favor, pngase en contacto con Rolm Gala llamando al [phone] y presione la opcin 4 o envenos un mensaje a travs de Clinical cytogeneticist.   No podemos decirle cul ser su copago por los medicamentos por adelantado ya que esto es diferente dependiendo de la cobertura de su seguro. Sin embargo, es posible que podamos encontrar un medicamento sustituto a Audiological scientist un formulario para que el seguro cubra el medicamento que se considera necesario.   Si se requiere una autorizacin previa para que su compaa de seguros [place] su medicamento, por favor permtanos de 1 a 2 das hbiles para completar [address]  Los precios de los medicamentos varan con frecuencia dependiendo del Environmental consultant de dnde se surte la receta y alguna farmacias pueden ofrecer precios ms baratos.  El sitio web www.goodrx.com tiene cupones para medicamentos de Health and safety inspector. Los precios aqu no tienen en cuenta lo que podra costar con  la ayuda del seguro (puede ser ms barato con su seguro), pero el sitio web puede darle el precio si no utiliz Tourist information centre manager.  - Puede imprimir el cupn correspondiente y llevarlo con su receta a la farmacia.  - Tambin puede pasar por nuestra oficina durante el horario de atencin regular y Education officer, museum una tarjeta de cupones de GoodRx.  - Si necesita que su receta se enve electrnicamente a una farmacia diferente, informe a nuestra oficina a travs de MyChart de [place] o por telfono llamando al [phone] y presione la opcin 4.

## 2023-07-20 NOTE — Progress Notes (Signed)
   Follow-Up Visit   Subjective  Adrian [name] is a 77 y.o. male who presents for the following: bumps, scalp, 69m, itchy, non healing  The patient has spots, moles and lesions to be evaluated, some may be new or changing and the patient may have concern these could be cancer.   The following portions of the chart were reviewed this encounter and updated as appropriate: medications, allergies, medical history  Review of Systems:  No other skin or systemic complaints except as noted in HPI or Assessment and Plan.  Objective  Well appearing patient in no apparent distress; mood and affect are within normal limits.   A focused examination was performed of the following areas: Scalp, face  Relevant exam findings are noted in the Assessment and Plan.  Scalp x 11 (11) Stuck on waxy paps with erythema    Assessment & Plan   SEBORRHEIC KERATOSIS - Stuck-on, waxy, tan-brown papules and/or plaques  - Benign-appearing - Discussed benign etiology and prognosis. - Observe - Call for any changes  Inflamed seborrheic keratosis (11) Scalp x 11  Symptomatic, irritating, patient would like treated.   Destruction of lesion - Scalp x 11 (11)  Destruction method: cryotherapy   Informed consent: discussed and consent obtained   Lesion destroyed using liquid nitrogen: Yes   Region frozen until ice ball extended beyond lesion: Yes   Outcome: patient tolerated procedure well with no complications   Post-procedure details: wound care instructions given   Additional details:  Prior to procedure, discussed risks of blister formation, small wound, skin dyspigmentation, or rare scar following cryotherapy. Recommend Vaseline ointment to treated areas while healing.   ACTINIC DAMAGE - chronic, secondary to cumulative UV radiation exposure/sun exposure over time - diffuse scaly erythematous macules with underlying dyspigmentation - Recommend daily broad spectrum sunscreen SPF 30+ to sun-exposed  areas, reapply every 2 hours as needed.  - Recommend staying in the shade or wearing long sleeves, sun glasses (UVA+UVB protection) and wide brim hats (4-inch brim around the entire circumference of the hat). - Call for new or changing lesions.   Return for as scheduled for TBSE with Dr. Gwen Pounds, Hx of BCC, Hx of Dysplastic nevi.  I, Ardis Rowan, RMA, am acting as scribe for Willeen Niece, MD .   Documentation: I have reviewed the above documentation for accuracy and completeness, and I agree with the above.  Willeen Niece, MD

## 2023-07-25 DIAGNOSIS — I4891 Unspecified atrial fibrillation: Secondary | ICD-10-CM | POA: Diagnosis not present

## 2023-07-25 DIAGNOSIS — E7800 Pure hypercholesterolemia, unspecified: Secondary | ICD-10-CM | POA: Diagnosis not present

## 2023-07-25 DIAGNOSIS — Z7901 Long term (current) use of anticoagulants: Secondary | ICD-10-CM | POA: Diagnosis not present

## 2023-07-25 DIAGNOSIS — I10 Essential (primary) hypertension: Secondary | ICD-10-CM | POA: Diagnosis not present

## 2023-07-25 DIAGNOSIS — G4733 Obstructive sleep apnea (adult) (pediatric): Secondary | ICD-10-CM | POA: Diagnosis not present

## 2023-07-25 DIAGNOSIS — I4892 Unspecified atrial flutter: Secondary | ICD-10-CM | POA: Diagnosis not present

## 2023-07-25 DIAGNOSIS — R001 Bradycardia, unspecified: Secondary | ICD-10-CM | POA: Diagnosis not present

## 2023-07-25 DIAGNOSIS — R0602 Shortness of breath: Secondary | ICD-10-CM | POA: Diagnosis not present

## 2023-08-01 DIAGNOSIS — I4891 Unspecified atrial fibrillation: Secondary | ICD-10-CM | POA: Diagnosis not present

## 2023-08-01 DIAGNOSIS — Z7901 Long term (current) use of anticoagulants: Secondary | ICD-10-CM | POA: Diagnosis not present

## 2023-08-03 DIAGNOSIS — M170 Bilateral primary osteoarthritis of knee: Secondary | ICD-10-CM | POA: Diagnosis not present

## 2023-08-04 DIAGNOSIS — I4891 Unspecified atrial fibrillation: Secondary | ICD-10-CM | POA: Diagnosis not present

## 2023-08-04 DIAGNOSIS — E538 Deficiency of other specified B group vitamins: Secondary | ICD-10-CM | POA: Diagnosis not present

## 2023-08-04 DIAGNOSIS — F3341 Major depressive disorder, recurrent, in partial remission: Secondary | ICD-10-CM | POA: Diagnosis not present

## 2023-08-04 DIAGNOSIS — M129 Arthropathy, unspecified: Secondary | ICD-10-CM | POA: Diagnosis not present

## 2023-08-04 DIAGNOSIS — J449 Chronic obstructive pulmonary disease, unspecified: Secondary | ICD-10-CM | POA: Diagnosis not present

## 2023-08-04 DIAGNOSIS — E1140 Type 2 diabetes mellitus with diabetic neuropathy, unspecified: Secondary | ICD-10-CM | POA: Diagnosis not present

## 2023-08-15 DIAGNOSIS — R519 Headache, unspecified: Secondary | ICD-10-CM | POA: Diagnosis not present

## 2023-08-15 DIAGNOSIS — G629 Polyneuropathy, unspecified: Secondary | ICD-10-CM | POA: Diagnosis not present

## 2023-08-15 DIAGNOSIS — G8929 Other chronic pain: Secondary | ICD-10-CM | POA: Diagnosis not present

## 2023-08-15 DIAGNOSIS — M48062 Spinal stenosis, lumbar region with neurogenic claudication: Secondary | ICD-10-CM | POA: Diagnosis not present

## 2023-08-24 DIAGNOSIS — J449 Chronic obstructive pulmonary disease, unspecified: Secondary | ICD-10-CM | POA: Diagnosis not present

## 2023-08-24 DIAGNOSIS — F3341 Major depressive disorder, recurrent, in partial remission: Secondary | ICD-10-CM | POA: Diagnosis not present

## 2023-08-24 DIAGNOSIS — Z7901 Long term (current) use of anticoagulants: Secondary | ICD-10-CM | POA: Diagnosis not present

## 2023-08-24 DIAGNOSIS — M1A00X0 Idiopathic chronic gout, unspecified site, without tophus (tophi): Secondary | ICD-10-CM | POA: Diagnosis not present

## 2023-08-24 DIAGNOSIS — I10 Essential (primary) hypertension: Secondary | ICD-10-CM | POA: Diagnosis not present

## 2023-08-24 DIAGNOSIS — Z0000 Encounter for general adult medical examination without abnormal findings: Secondary | ICD-10-CM | POA: Diagnosis not present

## 2023-08-24 DIAGNOSIS — E1140 Type 2 diabetes mellitus with diabetic neuropathy, unspecified: Secondary | ICD-10-CM | POA: Diagnosis not present

## 2023-08-24 DIAGNOSIS — E7800 Pure hypercholesterolemia, unspecified: Secondary | ICD-10-CM | POA: Diagnosis not present

## 2023-08-24 DIAGNOSIS — F419 Anxiety disorder, unspecified: Secondary | ICD-10-CM | POA: Diagnosis not present

## 2023-08-24 DIAGNOSIS — I4891 Unspecified atrial fibrillation: Secondary | ICD-10-CM | POA: Diagnosis not present

## 2023-08-24 DIAGNOSIS — Z1331 Encounter for screening for depression: Secondary | ICD-10-CM | POA: Diagnosis not present

## 2023-08-24 DIAGNOSIS — F17210 Nicotine dependence, cigarettes, uncomplicated: Secondary | ICD-10-CM | POA: Diagnosis not present

## 2023-08-29 DIAGNOSIS — M5126 Other intervertebral disc displacement, lumbar region: Secondary | ICD-10-CM | POA: Diagnosis not present

## 2023-08-29 DIAGNOSIS — M48062 Spinal stenosis, lumbar region with neurogenic claudication: Secondary | ICD-10-CM | POA: Diagnosis not present

## 2023-08-29 DIAGNOSIS — M5416 Radiculopathy, lumbar region: Secondary | ICD-10-CM | POA: Diagnosis not present

## 2023-08-29 DIAGNOSIS — M5136 Other intervertebral disc degeneration, lumbar region: Secondary | ICD-10-CM | POA: Diagnosis not present

## 2023-08-29 DIAGNOSIS — Z79899 Other long term (current) drug therapy: Secondary | ICD-10-CM | POA: Diagnosis not present

## 2023-08-29 DIAGNOSIS — M62830 Muscle spasm of back: Secondary | ICD-10-CM | POA: Diagnosis not present

## 2023-08-31 ENCOUNTER — Ambulatory Visit: Payer: Medicare HMO

## 2023-09-02 DIAGNOSIS — I4892 Unspecified atrial flutter: Secondary | ICD-10-CM | POA: Diagnosis not present

## 2023-09-02 DIAGNOSIS — R001 Bradycardia, unspecified: Secondary | ICD-10-CM | POA: Diagnosis not present

## 2023-09-02 DIAGNOSIS — I10 Essential (primary) hypertension: Secondary | ICD-10-CM | POA: Diagnosis not present

## 2023-09-02 DIAGNOSIS — Z7901 Long term (current) use of anticoagulants: Secondary | ICD-10-CM | POA: Diagnosis not present

## 2023-09-02 DIAGNOSIS — R079 Chest pain, unspecified: Secondary | ICD-10-CM | POA: Diagnosis not present

## 2023-09-02 DIAGNOSIS — G4733 Obstructive sleep apnea (adult) (pediatric): Secondary | ICD-10-CM | POA: Diagnosis not present

## 2023-09-02 DIAGNOSIS — R0602 Shortness of breath: Secondary | ICD-10-CM | POA: Diagnosis not present

## 2023-09-02 DIAGNOSIS — E7800 Pure hypercholesterolemia, unspecified: Secondary | ICD-10-CM | POA: Diagnosis not present

## 2023-09-02 DIAGNOSIS — I2510 Atherosclerotic heart disease of native coronary artery without angina pectoris: Secondary | ICD-10-CM | POA: Diagnosis not present

## 2023-09-02 DIAGNOSIS — I4891 Unspecified atrial fibrillation: Secondary | ICD-10-CM | POA: Diagnosis not present

## 2023-09-05 DIAGNOSIS — R29810 Facial weakness: Secondary | ICD-10-CM | POA: Diagnosis not present

## 2023-09-05 DIAGNOSIS — R2689 Other abnormalities of gait and mobility: Secondary | ICD-10-CM | POA: Diagnosis not present

## 2023-09-05 DIAGNOSIS — G4733 Obstructive sleep apnea (adult) (pediatric): Secondary | ICD-10-CM | POA: Diagnosis not present

## 2023-09-05 DIAGNOSIS — G629 Polyneuropathy, unspecified: Secondary | ICD-10-CM | POA: Diagnosis not present

## 2023-09-05 DIAGNOSIS — E538 Deficiency of other specified B group vitamins: Secondary | ICD-10-CM | POA: Diagnosis not present

## 2023-09-07 ENCOUNTER — Ambulatory Visit: Payer: Medicare HMO | Attending: Neurology

## 2023-09-07 DIAGNOSIS — R262 Difficulty in walking, not elsewhere classified: Secondary | ICD-10-CM | POA: Diagnosis not present

## 2023-09-07 DIAGNOSIS — M6281 Muscle weakness (generalized): Secondary | ICD-10-CM | POA: Insufficient documentation

## 2023-09-07 DIAGNOSIS — G8929 Other chronic pain: Secondary | ICD-10-CM | POA: Insufficient documentation

## 2023-09-07 DIAGNOSIS — M25562 Pain in left knee: Secondary | ICD-10-CM | POA: Diagnosis not present

## 2023-09-07 DIAGNOSIS — M25561 Pain in right knee: Secondary | ICD-10-CM | POA: Insufficient documentation

## 2023-09-07 NOTE — Therapy (Cosign Needed)
OUTPATIENT PHYSICAL THERAPY BALANCE EVALUATION   Patient Name: Adrian [name] MRN: [id number] DOB:1946/12/10, 77 y.o., male Today's Date: 09/08/2023  END OF SESSION:  PT End of Session - 09/07/23 1058     Visit Number 1    Number of Visits 17    Date for PT Re-Evaluation 11/02/23    Authorization Type eval: 09/07/2023    PT Start Time 1100    PT Stop Time 1145    PT Time Calculation (min) 45 min    Equipment Utilized During Treatment Gait belt    Activity Tolerance Patient tolerated treatment well    Behavior During Therapy WFL for tasks assessed/performed            Past Medical History:  Diagnosis Date   Anxiety    Arthritis    rheumatoid   Atypical mole 10/22/2015   R mid back paraspinal/mod   Basal cell carcinoma of skin 01/29/2010   L nasolabial fold   Basal cell carcinoma of skin 04/22/2015   L ear sup to tragus/excision   Basal cell carcinoma of skin 11/25/2015   R tragus/excision   Colon polyps    Depression    GERD (gastroesophageal reflux disease)    Gout    Hemorrhoids    History of basal cell carcinoma (BCC) 05/15/2020    right lower lip/chin   Hypertension    Lumbar radiculopathy 09/25/2014   Neuropathy    Osteoarthritis    Pneumonia    Pulmonary embolism (HCC) 05/25/2014   Overview:  Times 2 on anticoagulation a.  Times two.   b.  Filter placed.    Last Assessment & Plan:  Times 2 on anticoagulation a.  Times two.   b.  Filter placed. No current bleeding noted.     Reflux    Sleep apnea    Umbilical hernia    Past Surgical History:  Procedure Laterality Date   CARDIOVERSION N/A 03/18/2021   Procedure: CARDIOVERSION;  Surgeon: Lamar Blinks, MD;  Location: [facility];  Service: Cardiovascular;  Laterality: N/A;   COLONOSCOPY     ESOPHAGOGASTRODUODENOSCOPY     HERNIA REPAIR     umbilical   IVC FILTER INSERTION  04/15/2014   Bard Denali IVC filter by Dr. Wyn Quaker   TOTAL HIP ARTHROPLASTY Right 01/02/2019   Procedure: TOTAL HIP ARTHROPLASTY  ANTERIOR APPROACH;  Surgeon: Kennedy Bucker, MD;  Location: [facility];  Service: Orthopedics;  Laterality: Right;   Patient Active Problem List   Diagnosis Date Noted   Status post total hip replacement, right 01/02/2019   Chest pain 08/23/2016   Near syncope 08/23/2016   Osteoarthritis of both hips 03/08/2016   Arthritis of knee, degenerative 02/27/2016   Elevated sedimentation rate 02/11/2016   Elevated C-reactive protein (CRP) 02/11/2016   Abnormal MRI, lumbar spine (02/03/2016) 02/10/2016   Lumbar spondylosis 02/10/2016   Abnormal NCS (nerve conduction studies) 02/10/2016   Chronic anticoagulation (Coumadin and Plaquenil) (due to pulmonary embolism) 02/09/2016   Chronic low back pain (Location of Tertiary source of pain) (Right) 02/09/2016   Chronic pain 12/10/2015   Neuropathy (HCC) (lower extremity peripheral neuropathy diagnosed by EMG and PNCV done by Dr. Malvin Johns) 12/10/2015   Chronic lower extremity pain (Location of Primary Source of Pain) (Bilateral) (R>L) 12/10/2015   Chronic hand pain (Bilateral) (neuropathy) 12/10/2015   Chronic knee pain (Location of Secondary source of pain) (Bilateral) (R>L) 12/10/2015   Inflammatory pain 12/10/2015   Neuropathic pain 12/10/2015   Neurogenic pain 12/10/2015   Chronic  lumbar radicular pain (S1 Dermatome) (Right) 12/10/2015   Long term current use of opiate analgesic 12/10/2015   Long term prescription opiate use 12/10/2015   Opiate use 12/10/2015   Bleeding gastrointestinal 12/10/2015   HLD (hyperlipidemia) 12/10/2015   History of pulmonary embolism x 2 12/10/2015   Chronic lumbar radiculopathy (Right) 12/10/2015   Diffuse myofascial pain syndrome 12/10/2015   Encounter for therapeutic drug level monitoring 12/10/2015   Encounter for chronic pain management 12/10/2015   Encounter for pain management planning 12/10/2015   Mixed simple and mucopurulent chronic bronchitis (HCC) 03/27/2015   Loss of feeling or sensation 02/11/2015    Polyneuropathy 02/11/2015   Low serum cobalamin 11/11/2014   Intermittent claudication (HCC) 09/25/2014   Major depression in remission (HCC) 09/25/2014   Lumbar canal stenosis (Severe: L4-5) (Moderate: L3-4 and L5-S1) (Mild: L1-2, L2-3) 09/25/2014   Morbid (severe) obesity due to excess calories (HCC) 09/25/2014   Morbid obesity with BMI of 40.0-44.9, adult (HCC) 09/25/2014   Anxiety 05/25/2014   Chronic headache 05/25/2014   Acid reflux 05/25/2014   Benign hypertension 05/25/2014   Blood glucose elevated 05/25/2014   Obstructive apnea 05/25/2014   Pulmonary embolism (HCC) 05/25/2014    PCP: Marina Goodell, MD  REFERRING PROVIDER: Lonell Face, MD   REFERRING DIAG:  R26.89 (ICD-10-CM) - Imbalance  M25.569 (ICD-10-CM) - Knee pain  R29.898 (ICD-10-CM) - Lower extremity weakness   RATIONALE FOR EVALUATION AND TREATMENT: Rehabilitation  THERAPY DIAG: Difficulty in walking, not elsewhere classified  Muscle weakness (generalized)  Chronic pain of both knees  ONSET DATE: September 2020  FOLLOW-UP APPT SCHEDULED WITH REFERRING PROVIDER: Yes    SUBJECTIVE:                                                                                                                                                                                         SUBJECTIVE STATEMENT:  Imbalance and Generalized Weakness   PERTINENT HISTORY:  Per Chart Review : 06/09/2023:  Patient stated his neuropathy symptoms are worsening. He reported that he cannot lift his feet rather he "slides" his feet. He reports that he feels as though he is walking on his toes. He reports constant discomfort in his lower extremity - feels his feet stay cold, pins/needle sensation, leg pain. Swings his legs around to walk. Feels like he has been walking on his toes x 1 - 2 months.   Today:  Patient arrived to physical therapy stating that his peripheral neuropathy has been worsening. He reports tingling and numbness in  bilateral toes. He reports having difficulty navigating stairs (he navigates sideways) and walking community distances. He "slides" his  feet rather than picking them up throughout the day. He has been experiencing worsening anterior and posterior knee pain. He has a scheduled followed up with cardiologist in order to analyze heart rhythm. Patient denies fevers chills night sweats and saddle paresthesia.     Pain: Yes Numbness/Tingling: Yes Bilateral Toes  Focal Weakness: Yes Recent changes in overall health/medication: Yes Prior history of physical therapy for balance:  Yes Dominant hand: right Imaging: Yes  Red flags: Negative for bowel/bladder changes, saddle paresthesia, personal history of cancer, h/o spinal tumors, h/o compression fx, h/o abdominal aneurysm, abdominal pain, chills/fever, night sweats, nausea, vomiting, unrelenting pain, first onset of insidious LBP <20 y/o  PRECAUTIONS: None  WEIGHT BEARING RESTRICTIONS: No  FALLS: Has patient fallen in last 6 months? No, Directional pattern for falls: No  Living Environment Lives with: lives with their spouse Lives in: House/apartment Stairs: Yes: External: 5 steps; bilateral but cannot reach both Has following equipment at home: None  Prior level of function: Independent  Occupational demands: Retired  Hobbies: Multimedia programmer   Patient Goals: "I would like to learn how to  become more active again"   OBJECTIVE:   Patient Surveys  FOTO: 46 ,predicted improvement to 68 ABC: To be completed  Cognition Deferred    Gross Musculoskeletal Assessment Tremor: None Bulk: Normal Tone: Normal  Posture: No gross abnormalities noted in standing or seated posture  AROM AROM (Normal range in degrees) AROM   Lumbar   Flexion (65)   Extension (30)   Right lateral flexion (25)   Left lateral flexion (25)   Right rotation (30)   Left rotation (30)       Hip Right Left  Flexion (125)    Extension (15)    Abduction (40)     Adduction     Internal Rotation (45)    External Rotation (45)        Knee    Flexion (135)    Extension (0)        Ankle    Dorsiflexion (20)    Plantarflexion (50)    Inversion (35)    Eversion (15)    (* = pain; Blank rows = not tested)  LE MMT: MMT (out of 5) Right  Left   Hip flexion 4 4-  Hip extension    Hip abduction    Hip adduction    Hip internal rotation 5 5  Hip external rotation 5 5  Knee flexion 5 5  Knee extension 4 4  Ankle dorsiflexion 5 5  Ankle plantarflexion 5 5  Ankle inversion    Ankle eversion    (* = pain; Blank rows = not tested)  Sensation Grossly intact to light touch throughout bilateral LEs as determined by testing dermatomes L2-S2. Proprioception, stereognosis, and hot/cold testing deferred on this date. Notable decreased sensation on dorsal aspect of the foot. Patient with report of decreased sensation and numbness in sole of bilateral feet.   Reflexes R/L Knee Jerk (L3/4): 1+/1+ Ankle Jerk (S1/2): 1+/1+  Cranial Nerves Deferred   Coordination/Cerebellar Deferred  Bed mobility: Deferred  Transfers: Assistive device utilized: None  Sit to stand: Complete Independence Stand to sit: Complete Independence Intermittent periods of "catching" with return to sit.  Chair to chair: Complete Independence Floor: Deferred  Curb:  Deferred  Stairs: Level of Assistance: Complete Independence Stair Negotiation Technique: Step to Pattern with Bilateral Rails Number of Stairs: 4  Height of Stairs: 6"  Comments: Patient ascends with step through pattern leading  with right LE. Descends with bilateral UE support, and leads with left leg.   BP: 150/81 HR: 78  BP Post FGA:  148/80 HR: 79  Gait: Gait pattern: step through pattern, decreased step length- Right, decreased step length- Left, decreased stride length, decreased ankle dorsiflexion- Right, and decreased ankle dorsiflexion- Left Distance walked: 13m Assistive device  utilized: None Level of assistance: Complete Independence Comments: Presents with slowed, wide BoS, trunk flexed gait. Patient with intermittent decreased dorsiflexion and sliding of foot.  Functional Outcome Measures  Results Comments  BERG    DGI    FGA 16/30 0 - Tandem Gait  TUG    5TSTS 20.15 seconds   6 Minute Walk Test    10 Meter Gait Speed Self-selected: 10.07s = .99 m/s; Fastest: s = m/s   (Blank rows = not tested)   TODAY'S TREATMENT  Deferred   PATIENT EDUCATION:  Education details: Plan of Care and HEP Person educated: Patient Education method: Explanation and Handouts Education comprehension: verbalized understanding   HOME EXERCISE PROGRAM:  Access Code: [email or web address] URL: [email or web address] Date: 09/08/2023 Prepared by: Ria Comment  Exercises - Standing Romberg to 3/4 Tandem Stance  - 1 x daily - 7 x weekly - 1 sets - 5 reps - 10-30 hold - Sit to Stand with Hands on Knees  - 1 x daily - 7 x weekly - 2-3 sets - 10 reps - Seated Long Arc Quad  - 1 x daily - 7 x weekly - 2-3 sets - 10 reps  ASSESSMENT:  CLINICAL IMPRESSION: Patient is a 77 y.o. male who was seen today for physical therapy evaluation and treatment for knee pain and imbalance. He is independent with all functional activities and ADLs. Objective findings today were significant for decreased strength, balance, sensation, and pain. Patient demonstrated a 16/30 (cutoff < 22) on the FGA indicating he is a fall risk. Pt demonstrated .99 m/s on and 20.15s on the 5TSTS indicating increased fall risk and decreased LE strength. Throughout session, BP and HR monitored due to active hx of A-fib and reported SOB after FGA. (BP: 150/81 HR: 78; Post FGA: 148/80 HR: 79). During sit to stand transfer, patient will report of "knee's catching" bilaterally before return to seated position. PT provided initial HEP in today's session.  Based on today's performance, Pt will benefit from skilled PT  services focused on LE strengthening and balance 1-2x a week in order to address deficits in strength and functional mobility in order to return to full function at home.   OBJECTIVE IMPAIRMENTS: Abnormal gait, decreased activity tolerance, decreased balance, decreased endurance, decreased mobility, difficulty walking, decreased ROM, decreased strength, impaired sensation, obesity, and pain.   ACTIVITY LIMITATIONS: carrying, lifting, bending, standing, squatting, stairs, and transfers  PARTICIPATION LIMITATIONS: driving, shopping, and community activity  PERSONAL FACTORS: Age, Behavior pattern, Fitness, Time since onset of injury/illness/exacerbation, and 3+ comorbidities: A-fib, Bilateral Neuropathy, Arthritis  are also affecting patient's functional outcome.   REHAB POTENTIAL: Fair Patient with progressive neuropathy, cardiac impairments and inconsistent polypharmacy.   CLINICAL DECISION MAKING: Unstable/unpredictable  EVALUATION COMPLEXITY: High   GOALS: Goals reviewed with patient? No  SHORT TERM GOALS: Target date: 10/05/2023   Pt will be independent with HEP in order to improve strength and balance in order to decrease fall risk and improve function at home. Baseline:  Goal status: INITIAL   LONG TERM GOALS: Target date: 11/02/2023  Pt will increase FOTO to at least 52 to demonstrate significant improvement in function at  home related to balance  Baseline: 09/07/23: 46 Goal status: INITIAL  2.  Pt will improve BERG by at least 3 points in order to demonstrate clinically significant improvement in balance.   Baseline: To be Completed Goal status: INITIAL  3.  Pt will improve ABC by at least 13% in order to demonstrate clinically significant improvement in balance confidence.      Baseline: To Be Completed  Goal status: INITIAL  4. Pt will decrease 5TSTS by at least 3 seconds in order to demonstrate clinically significant improvement in LE strength      Baseline: To Be  Completed Goal status: INITIAL  7. Pt will increase FGA by at least > 23  in order to demonstrate clinically significant improvement in decreasing fall risk and increasing balance. Baseline: 09/07/23: 16/30; Goal status: INITIAL   PLAN: PT FREQUENCY: 2x/week  PT DURATION: 8 weeks  PLANNED INTERVENTIONS: Therapeutic exercises, Therapeutic activity, Neuromuscular re-education, Balance training, Gait training, Patient/Family education, Self Care, Joint mobilization, Joint manipulation, Vestibular training, Canalith repositioning, Orthotic/Fit training, DME instructions, Dry Needling, Electrical stimulation, Spinal manipulation, Spinal mobilization, Cryotherapy, Moist heat, Taping, Traction, Ultrasound, Ionotophoresis 4mg /ml Dexamethasone, Manual therapy, and Re-evaluation.  PLAN FOR NEXT SESSION: ABC, HIP and KNEE ROM, Initiate LE strengthening and balance exercises.    Lynnea Maizes PT, DPT, GCS  Sundee Garland, SPT

## 2023-09-12 ENCOUNTER — Ambulatory Visit: Payer: Medicare HMO

## 2023-09-12 ENCOUNTER — Other Ambulatory Visit: Payer: Self-pay | Admitting: Internal Medicine

## 2023-09-12 DIAGNOSIS — M6281 Muscle weakness (generalized): Secondary | ICD-10-CM | POA: Diagnosis not present

## 2023-09-12 DIAGNOSIS — M25562 Pain in left knee: Secondary | ICD-10-CM | POA: Diagnosis not present

## 2023-09-12 DIAGNOSIS — R262 Difficulty in walking, not elsewhere classified: Secondary | ICD-10-CM

## 2023-09-12 DIAGNOSIS — G8929 Other chronic pain: Secondary | ICD-10-CM | POA: Diagnosis not present

## 2023-09-12 DIAGNOSIS — R0602 Shortness of breath: Secondary | ICD-10-CM

## 2023-09-12 DIAGNOSIS — M25561 Pain in right knee: Secondary | ICD-10-CM | POA: Diagnosis not present

## 2023-09-12 MED ORDER — SODIUM CHLORIDE 0.9 % IV SOLN: INTRAVENOUS | Status: DC

## 2023-09-12 NOTE — Therapy (Cosign Needed Addendum)
OUTPATIENT PHYSICAL THERAPY BALANCE TREAMENT   Patient Name: Adrian [name] MRN: [id number] DOB:1946-09-02, 77 y.o., male Today's Date: 09/13/2023  END OF SESSION:  PT End of Session - 09/12/23 1106     Visit Number 2    Number of Visits 17    Date for PT Re-Evaluation 11/02/23    Authorization Type eval: 09/07/2023    PT Start Time 1101    PT Stop Time 1143    PT Time Calculation (min) 42 min    Equipment Utilized During Treatment Gait belt    Activity Tolerance Patient tolerated treatment well    Behavior During Therapy WFL for tasks assessed/performed            Past Medical History:  Diagnosis Date   Anxiety    Arthritis    rheumatoid   Atypical mole 10/22/2015   R mid back paraspinal/mod   Basal cell carcinoma of skin 01/29/2010   L nasolabial fold   Basal cell carcinoma of skin 04/22/2015   L ear sup to tragus/excision   Basal cell carcinoma of skin 11/25/2015   R tragus/excision   Colon polyps    Depression    GERD (gastroesophageal reflux disease)    Gout    Hemorrhoids    History of basal cell carcinoma (BCC) 05/15/2020    right lower lip/chin   Hypertension    Lumbar radiculopathy 09/25/2014   Neuropathy    Osteoarthritis    Pneumonia    Pulmonary embolism (HCC) 05/25/2014   Overview:  Times 2 on anticoagulation a.  Times two.   b.  Filter placed.    Last Assessment & Plan:  Times 2 on anticoagulation a.  Times two.   b.  Filter placed. No current bleeding noted.     Reflux    Sleep apnea    Umbilical hernia    Past Surgical History:  Procedure Laterality Date   CARDIOVERSION N/A 03/18/2021   Procedure: CARDIOVERSION;  Surgeon: Lamar Blinks, MD;  Location: [facility];  Service: Cardiovascular;  Laterality: N/A;   COLONOSCOPY     ESOPHAGOGASTRODUODENOSCOPY     HERNIA REPAIR     umbilical   IVC FILTER INSERTION  04/15/2014   Bard Denali IVC filter by Dr. Wyn Quaker   TOTAL HIP ARTHROPLASTY Right 01/02/2019   Procedure: TOTAL HIP ARTHROPLASTY ANTERIOR  APPROACH;  Surgeon: Kennedy Bucker, MD;  Location: [facility];  Service: Orthopedics;  Laterality: Right;   Patient Active Problem List   Diagnosis Date Noted   Status post total hip replacement, right 01/02/2019   Chest pain 08/23/2016   Near syncope 08/23/2016   Osteoarthritis of both hips 03/08/2016   Arthritis of knee, degenerative 02/27/2016   Elevated sedimentation rate 02/11/2016   Elevated C-reactive protein (CRP) 02/11/2016   Abnormal MRI, lumbar spine (02/03/2016) 02/10/2016   Lumbar spondylosis 02/10/2016   Abnormal NCS (nerve conduction studies) 02/10/2016   Chronic anticoagulation (Coumadin and Plaquenil) (due to pulmonary embolism) 02/09/2016   Chronic low back pain (Location of Tertiary source of pain) (Right) 02/09/2016   Chronic pain 12/10/2015   Neuropathy (HCC) (lower extremity peripheral neuropathy diagnosed by EMG and PNCV done by Dr. Malvin Johns) 12/10/2015   Chronic lower extremity pain (Location of Primary Source of Pain) (Bilateral) (R>L) 12/10/2015   Chronic hand pain (Bilateral) (neuropathy) 12/10/2015   Chronic knee pain (Location of Secondary source of pain) (Bilateral) (R>L) 12/10/2015   Inflammatory pain 12/10/2015   Neuropathic pain 12/10/2015   Neurogenic pain 12/10/2015   Chronic  lumbar radicular pain (S1 Dermatome) (Right) 12/10/2015   Long term current use of opiate analgesic 12/10/2015   Long term prescription opiate use 12/10/2015   Opiate use 12/10/2015   Bleeding gastrointestinal 12/10/2015   HLD (hyperlipidemia) 12/10/2015   History of pulmonary embolism x 2 12/10/2015   Chronic lumbar radiculopathy (Right) 12/10/2015   Diffuse myofascial pain syndrome 12/10/2015   Encounter for therapeutic drug level monitoring 12/10/2015   Encounter for chronic pain management 12/10/2015   Encounter for pain management planning 12/10/2015   Mixed simple and mucopurulent chronic bronchitis (HCC) 03/27/2015   Loss of feeling or sensation 02/11/2015    Polyneuropathy 02/11/2015   Low serum cobalamin 11/11/2014   Intermittent claudication (HCC) 09/25/2014   Major depression in remission (HCC) 09/25/2014   Lumbar canal stenosis (Severe: L4-5) (Moderate: L3-4 and L5-S1) (Mild: L1-2, L2-3) 09/25/2014   Morbid (severe) obesity due to excess calories (HCC) 09/25/2014   Morbid obesity with BMI of 40.0-44.9, adult (HCC) 09/25/2014   Anxiety 05/25/2014   Chronic headache 05/25/2014   Acid reflux 05/25/2014   Benign hypertension 05/25/2014   Blood glucose elevated 05/25/2014   Obstructive apnea 05/25/2014   Pulmonary embolism (HCC) 05/25/2014    PCP: Marina Goodell, MD  REFERRING PROVIDER: Lonell Face, MD   REFERRING DIAG:  R26.89 (ICD-10-CM) - Imbalance  M25.569 (ICD-10-CM) - Knee pain  R29.898 (ICD-10-CM) - Lower extremity weakness   RATIONALE FOR EVALUATION AND TREATMENT: Rehabilitation  THERAPY DIAG: Difficulty in walking, not elsewhere classified  Muscle weakness (generalized)  Chronic pain of both knees  ONSET DATE: September 2020  FOLLOW-UP APPT SCHEDULED WITH REFERRING PROVIDER: Yes    SUBJECTIVE:                                                                                                                                                                                         SUBJECTIVE STATEMENT:  Imbalance and Generalized Weakness   PERTINENT HISTORY:  Per Chart Review : 06/09/2023:  Patient stated his neuropathy symptoms are worsening. He reported that he cannot lift his feet rather he "slides" his feet. He reports that he feels as though he is walking on his toes. He reports constant discomfort in his lower extremity - feels his feet stay cold, pins/needle sensation, leg pain. Swings his legs around to walk. Feels like he has been walking on his toes x 1 - 2 months.   Today:  Patient arrived to physical therapy stating that his peripheral neuropathy has been worsening. He reports tingling and numbness in  bilateral toes. He reports having difficulty navigating stairs (he navigates sideways) and walking community distances. He "slides" his  feet rather than picking them up throughout the day. He has been experiencing worsening anterior and posterior knee pain. He has a scheduled followed up with cardiologist in order to analyze heart rhythm. Patient denies fevers chills night sweats and saddle paresthesia.     Pain: Yes Numbness/Tingling: Yes Bilateral Toes  Focal Weakness: Yes Recent changes in overall health/medication: Yes Prior history of physical therapy for balance:  Yes Dominant hand: right Imaging: Yes  Red flags: Negative for bowel/bladder changes, saddle paresthesia, personal history of cancer, h/o spinal tumors, h/o compression fx, h/o abdominal aneurysm, abdominal pain, chills/fever, night sweats, nausea, vomiting, unrelenting pain, first onset of insidious LBP <20 y/o  PRECAUTIONS: None  WEIGHT BEARING RESTRICTIONS: No  FALLS: Has patient fallen in last 6 months? No, Directional pattern for falls: No  Living Environment Lives with: lives with their spouse Lives in: House/apartment Stairs: Yes: External: 5 steps; bilateral but cannot reach both Has following equipment at home: None  Prior level of function: Independent  Occupational demands: Retired  Hobbies: Multimedia programmer   Patient Goals: "I would like to learn how to  become more active again"   OBJECTIVE:   Patient Surveys  FOTO: 46 ,predicted improvement to 69 ABC: To be completed  Cognition Deferred    Gross Musculoskeletal Assessment Tremor: None Bulk: Normal Tone: Normal  Posture: No gross abnormalities noted in standing or seated posture  AROM AROM (Normal range in degrees) AROM   Lumbar   Flexion (65)   Extension (30)   Right lateral flexion (25)   Left lateral flexion (25)   Right rotation (30)   Left rotation (30)       Hip Right Left  Flexion (125)    Extension (15)    Abduction (40)     Adduction     Internal Rotation (45)    External Rotation (45)        Knee    Flexion (135)    Extension (0)        Ankle    Dorsiflexion (20)    Plantarflexion (50)    Inversion (35)    Eversion (15)    (* = pain; Blank rows = not tested)  LE MMT: MMT (out of 5) Right  Left   Hip flexion 4 4-  Hip extension    Hip abduction    Hip adduction    Hip internal rotation 5 5  Hip external rotation 5 5  Knee flexion 5 5  Knee extension 4 4  Ankle dorsiflexion 5 5  Ankle plantarflexion 5 5  Ankle inversion    Ankle eversion    (* = pain; Blank rows = not tested)  Sensation Grossly intact to light touch throughout bilateral LEs as determined by testing dermatomes L2-S2. Proprioception, stereognosis, and hot/cold testing deferred on this date. Notable decreased sensation on dorsal aspect of the foot. Patient with report of decreased sensation and numbness in sole of bilateral feet.   Reflexes R/L Knee Jerk (L3/4): 1+/1+ Ankle Jerk (S1/2): 1+/1+  Cranial Nerves Deferred   Coordination/Cerebellar Deferred  Bed mobility: Deferred  Transfers: Assistive device utilized: None  Sit to stand: Complete Independence Stand to sit: Complete Independence Intermittent periods of "catching" with return to sit.  Chair to chair: Complete Independence Floor: Deferred  Curb:  Deferred  Stairs: Level of Assistance: Complete Independence Stair Negotiation Technique: Step to Pattern with Bilateral Rails Number of Stairs: 4  Height of Stairs: 6"  Comments: Patient ascends with step through pattern leading  with right LE. Descends with bilateral UE support, and leads with left leg.   BP: 150/81 HR: 78  BP Post FGA:  148/80 HR: 79  Gait: Gait pattern: step through pattern, decreased step length- Right, decreased step length- Left, decreased stride length, decreased ankle dorsiflexion- Right, and decreased ankle dorsiflexion- Left Distance walked: 24m Assistive device  utilized: None Level of assistance: Complete Independence Comments: Presents with slowed, wide BoS, trunk flexed gait. Patient with intermittent decreased dorsiflexion and sliding of foot.  Functional Outcome Measures  Results Comments  BERG    DGI    FGA 16/30 0 - Tandem Gait  TUG    5TSTS 20.15 seconds   6 Minute Walk Test    10 Meter Gait Speed Self-selected: 10.07s = .99 m/s; Fastest: s = m/s   (Blank rows = not tested)   TODAY'S TREATMENT   Subjective: Patient arrived with moderate pain in knees. No further questions or concerns.   Pain: 7/10 bilateral knee pain   Neuromuscular Re-education:  NuStep Level 0 x 8 min for warm up and interval history; ABC recorded: 60%   BERG: 47/56 (< 45 cut off score for high fall risk) [3,4,4,3,3,4,4,3,4,4,4,4,2,1];  Static Stance Progression: Airex Pad, Parallel Stance, 1 x 30  Airex Pad, Romberg, 1 x 30s Airex Pad, Semi Tendem, 1 x 30s Obstacle Course with x2 Airex, 6" hurdle in // bars x multiple lengths (difficult with 12" hurdle) Sit-to-Stand with 8# medicine ball  2 x 10;  PATIENT EDUCATION:  Education details: Plan of Care and HEP Person educated: Patient Education method: Explanation and Handouts Education comprehension: verbalized understanding   HOME EXERCISE PROGRAM:  Access Code: [email or web address] URL: [email or web address] Date: 09/08/2023 Prepared by: Ria Comment  Exercises - Standing Romberg to 3/4 Tandem Stance  - 1 x daily - 7 x weekly - 1 sets - 5 reps - 10-30 hold - Sit to Stand with Hands on Knees  - 1 x daily - 7 x weekly - 2-3 sets - 10 reps - Seated Long Arc Quad  - 1 x daily - 7 x weekly - 2-3 sets - 10 reps  ASSESSMENT:  CLINICAL IMPRESSION: Patient arrived to physical therapy highly motivated. Today with a main focus on progressing balance activities. Pt performed BERG and scored 47/56 (above cut off for fall risk); although he was above the cutoff for fall risk, Pt challenged with single leg  activities and step ups. Pt still presents with LE weakness, specifically LLE more than RLE. HR monitored throughout session; didn't exceed 122 bpm and no pt report of SOB.Will continue progressing dynamic balance. Based on today's performance, pt will continue to benefit from skilled physical therapy in order to address deficits in strength and functional mobility in order to return to full function at home.   OBJECTIVE IMPAIRMENTS: Abnormal gait, decreased activity tolerance, decreased balance, decreased endurance, decreased mobility, difficulty walking, decreased ROM, decreased strength, impaired sensation, obesity, and pain.   ACTIVITY LIMITATIONS: carrying, lifting, bending, standing, squatting, stairs, and transfers  PARTICIPATION LIMITATIONS: driving, shopping, and community activity  PERSONAL FACTORS: Age, Behavior pattern, Fitness, Time since onset of injury/illness/exacerbation, and 3+ comorbidities: A-fib, Bilateral Neuropathy, Arthritis  are also affecting patient's functional outcome.   REHAB POTENTIAL: Fair Patient with progressive neuropathy, cardiac impairments and inconsistent polypharmacy.   CLINICAL DECISION MAKING: Unstable/unpredictable  EVALUATION COMPLEXITY: High   GOALS: Goals reviewed with patient? No  SHORT TERM GOALS: Target date: 10/05/2023   Pt will be independent with HEP in order to  improve strength and balance in order to decrease fall risk and improve function at home. Baseline:  Goal status: INITIAL   LONG TERM GOALS: Target date: 11/02/2023  Pt will increase FOTO to at least 52 to demonstrate significant improvement in function at home related to balance  Baseline: 09/07/23: 46 Goal status: INITIAL  2.  Pt will improve BERG by at least 3 points in order to demonstrate clinically significant improvement in balance.   Baseline: To be Completed; 09/12/23: 47/56;  Goal status: INITIAL  3.  Pt will improve ABC by at least 13% in order to demonstrate  clinically significant improvement in balance confidence.      Baseline: To Be Completed; Goal status: INITIAL  4. Pt will decrease 5TSTS by at least 3 seconds in order to demonstrate clinically significant improvement in LE strength      Baseline: To Be Completed Goal status: INITIAL  7. Pt will increase FGA by at least > 23  in order to demonstrate clinically significant improvement in decreasing fall risk and increasing balance. Baseline: 09/07/23: 16/30; Goal status: INITIAL   PLAN: PT FREQUENCY: 2x/week  PT DURATION: 8 weeks  PLANNED INTERVENTIONS: Therapeutic exercises, Therapeutic activity, Neuromuscular re-education, Balance training, Gait training, Patient/Family education, Self Care, Joint mobilization, Joint manipulation, Vestibular training, Canalith repositioning, Orthotic/Fit training, DME instructions, Dry Needling, Electrical stimulation, Spinal manipulation, Spinal mobilization, Cryotherapy, Moist heat, Taping, Traction, Ultrasound, Ionotophoresis 4mg /ml Dexamethasone, Manual therapy, and Re-evaluation.  PLAN FOR NEXT SESSION: ABC, HIP and KNEE ROM, Initiate LE strengthening and balance exercises.    Lynnea Maizes PT, DPT, GCS  Kyaire Gruenewald, SPT

## 2023-09-13 ENCOUNTER — Ambulatory Visit
Admission: RE | Admit: 2023-09-13 | Discharge: 2023-09-13 | Disposition: A | Discharge status: Home / Self Care | Payer: Medicare HMO | Attending: Internal Medicine | Admitting: Internal Medicine

## 2023-09-13 ENCOUNTER — Other Ambulatory Visit: Payer: Self-pay

## 2023-09-13 ENCOUNTER — Ambulatory Visit: Payer: Medicare HMO | Admitting: Anesthesiology

## 2023-09-13 ENCOUNTER — Encounter
Admission: RE | Disposition: A | Discharge status: Home / Self Care | Payer: Self-pay | Source: Home / Self Care | Attending: Internal Medicine

## 2023-09-13 ENCOUNTER — Encounter: Payer: Self-pay | Admitting: Internal Medicine

## 2023-09-13 DIAGNOSIS — F32A Depression, unspecified: Secondary | ICD-10-CM | POA: Insufficient documentation

## 2023-09-13 DIAGNOSIS — Z6841 Body Mass Index (BMI) 40.0 and over, adult: Secondary | ICD-10-CM | POA: Insufficient documentation

## 2023-09-13 DIAGNOSIS — F17210 Nicotine dependence, cigarettes, uncomplicated: Secondary | ICD-10-CM | POA: Insufficient documentation

## 2023-09-13 DIAGNOSIS — R519 Headache, unspecified: Secondary | ICD-10-CM | POA: Diagnosis not present

## 2023-09-13 DIAGNOSIS — M1990 Unspecified osteoarthritis, unspecified site: Secondary | ICD-10-CM | POA: Diagnosis not present

## 2023-09-13 DIAGNOSIS — G4733 Obstructive sleep apnea (adult) (pediatric): Secondary | ICD-10-CM | POA: Diagnosis not present

## 2023-09-13 DIAGNOSIS — G8929 Other chronic pain: Secondary | ICD-10-CM | POA: Insufficient documentation

## 2023-09-13 DIAGNOSIS — I252 Old myocardial infarction: Secondary | ICD-10-CM | POA: Diagnosis not present

## 2023-09-13 DIAGNOSIS — I491 Atrial premature depolarization: Secondary | ICD-10-CM | POA: Insufficient documentation

## 2023-09-13 DIAGNOSIS — K219 Gastro-esophageal reflux disease without esophagitis: Secondary | ICD-10-CM | POA: Insufficient documentation

## 2023-09-13 DIAGNOSIS — Z86711 Personal history of pulmonary embolism: Secondary | ICD-10-CM | POA: Insufficient documentation

## 2023-09-13 DIAGNOSIS — R0602 Shortness of breath: Secondary | ICD-10-CM | POA: Insufficient documentation

## 2023-09-13 DIAGNOSIS — E785 Hyperlipidemia, unspecified: Secondary | ICD-10-CM | POA: Diagnosis not present

## 2023-09-13 DIAGNOSIS — I4891 Unspecified atrial fibrillation: Secondary | ICD-10-CM | POA: Insufficient documentation

## 2023-09-13 DIAGNOSIS — I4819 Other persistent atrial fibrillation: Secondary | ICD-10-CM | POA: Diagnosis not present

## 2023-09-13 DIAGNOSIS — F419 Anxiety disorder, unspecified: Secondary | ICD-10-CM | POA: Diagnosis not present

## 2023-09-13 DIAGNOSIS — G629 Polyneuropathy, unspecified: Secondary | ICD-10-CM | POA: Diagnosis not present

## 2023-09-13 DIAGNOSIS — I10 Essential (primary) hypertension: Secondary | ICD-10-CM | POA: Insufficient documentation

## 2023-09-13 DIAGNOSIS — D649 Anemia, unspecified: Secondary | ICD-10-CM | POA: Insufficient documentation

## 2023-09-13 HISTORY — PX: CARDIOVERSION: SHX1299

## 2023-09-13 LAB — PROTIME-INR
INR: 2.1 — ABNORMAL HIGH (ref 0.8–1.2)
Prothrombin Time: 23.9 seconds — ABNORMAL HIGH (ref 11.4–15.2)

## 2023-09-13 SURGERY — CARDIOVERSION
Anesthesia: General

## 2023-09-13 MED ORDER — LIDOCAINE HCL (CARDIAC) PF 100 MG/5ML IV SOSY
PREFILLED_SYRINGE | INTRAVENOUS | Status: DC | PRN
  Administered 2023-09-13: 5 mg via INTRAVENOUS

## 2023-09-13 MED ORDER — PROPOFOL 10 MG/ML IV BOLUS
INTRAVENOUS | Status: DC | PRN
  Administered 2023-09-13: 10 mg via INTRAVENOUS
  Administered 2023-09-13: 70 mg via INTRAVENOUS
  Administered 2023-09-13: 5 mg via INTRAVENOUS

## 2023-09-13 NOTE — Anesthesia Preprocedure Evaluation (Addendum)
Anesthesia Evaluation  Patient identified by MRN, date of birth, ID band Patient awake    Reviewed: Allergy & Precautions, NPO status , Patient's Chart, lab work & pertinent test results  History of Anesthesia Complications Negative for: history of anesthetic complications  Airway Mallampati: II       Dental  (+) Partial Upper, Lower Dentures   Pulmonary sleep apnea and Continuous Positive Airway Pressure Ventilation , Current Smoker (2-3 cigarettes per day) and Patient abstained from smoking.   Pulmonary exam normal breath sounds clear to auscultation       Cardiovascular hypertension, + dysrhythmias (a fib on warfarin)  Rhythm:Irregular Rate:Normal  Hx PE 2015   ECG 09/13/23: Atrial fibrillation Borderline left axis deviation Anterior infarct, old  Echo 09/02/23:  NORMAL LEFT VENTRICULAR SYSTOLIC FUNCTION  NORMAL RIGHT VENTRICULAR SYSTOLIC FUNCTION  MILD VALVULAR REGURGITATION   NO VALVULAR STENOSIS  TRIVIAL PERICARDIAL EFFUSION  IRREGULAR HEART RHYTHM CAPTURED THROUGHOUT EXAM  ESTIMATED LVEF >55%  CALCULATED: 61.3%  MILD LAE    Neuro/Psych  Headaches PSYCHIATRIC DISORDERS Anxiety Depression    Chronic pain  Neuromuscular disease (polyneuropathy)    GI/Hepatic ,GERD  ,,  Endo/Other  Class 3 obesity  Renal/GU negative Renal ROS     Musculoskeletal  (+) Arthritis ,    Abdominal   Peds  Hematology  (+) Blood dyscrasia, anemia   Anesthesia Other Findings   Reproductive/Obstetrics                             Anesthesia Physical Anesthesia Plan  ASA: 3  Anesthesia Plan: General   Post-op Pain Management:    Induction: Intravenous  PONV Risk Score and Plan: 1 and Propofol infusion, TIVA and Treatment may vary due to age or medical condition  Airway Management Planned: Natural Airway  Additional Equipment:   Intra-op Plan:   Post-operative Plan:   Informed Consent: I  have reviewed the patients History and Physical, chart, labs and discussed the procedure including the risks, benefits and alternatives for the proposed anesthesia with the patient or authorized representative who has indicated his/her understanding and acceptance.       Plan Discussed with: CRNA  Anesthesia Plan Comments: (LMA/GETA backup discussed.  Patient consented for risks of anesthesia including but not limited to:  - adverse reactions to medications - damage to eyes, teeth, lips or other oral mucosa - nerve damage due to positioning  - sore throat or hoarseness - damage to heart, brain, nerves, lungs, other parts of body or loss of life  Informed patient about role of CRNA in peri- and intra-operative care.  Patient voiced understanding.)        Anesthesia Quick Evaluation

## 2023-09-13 NOTE — Transfer of Care (Signed)
Immediate Anesthesia Transfer of Care Note  Patient: Adrian [name]  Procedure(s) Performed: CARDIOVERSION  Patient Location: PACU  Anesthesia Type:General  Level of Consciousness: awake, alert , and oriented  Airway & Oxygen Therapy: Patient Spontanous Breathing  Post-op Assessment: Report given to RN and Post -op Vital signs reviewed and stable  Post vital signs: Reviewed and stable  Last Vitals:  Vitals Value Taken Time  BP 118/81 0749  Temp    Pulse 56   Resp 21   SpO2 98     Last Pain:  Vitals:   09/13/23 0714  TempSrc: Oral  PainSc: 0-No pain         Complications: No notable events documented.

## 2023-09-13 NOTE — Anesthesia Postprocedure Evaluation (Signed)
Anesthesia Post Note  Patient: Adrian [name]  Procedure(s) Performed: CARDIOVERSION  Patient location during evaluation: PACU Anesthesia Type: General Level of consciousness: awake and alert, oriented and patient cooperative Pain management: pain level controlled Vital Signs Assessment: post-procedure vital signs reviewed and stable Respiratory status: spontaneous breathing, nonlabored ventilation and respiratory function stable Cardiovascular status: blood pressure returned to baseline and stable Postop Assessment: adequate PO intake Anesthetic complications: no   No notable events documented.   Last Vitals:  Vitals:   09/13/23 0815 09/13/23 0825  BP: (!) 122/58 126/72  Pulse: 70 61  Resp: (!) 21 18  Temp:    SpO2: 99% 97%    Last Pain:  Vitals:   09/13/23 0825  TempSrc:   PainSc: 0-No pain                 Reed Breech

## 2023-09-13 NOTE — CV Procedure (Signed)
Electrical Cardioversion Procedure Note   Procedure: Electrical Cardioversion Indications:  Atrial Fibrillation  Procedure Details Consent: Risks of procedure as well as the alternatives and risks of each were explained to the (patient/caregiver).  Consent for procedure obtained. Time Out: Verified patient identification, verified procedure, site/side was marked, verified correct patient position, special equipment/implants available, medications/allergies/relevent history reviewed, required imaging and test results available.  Performed  Patient placed on cardiac monitor, pulse oximetry, supplemental oxygen as necessary.  Sedation given:  Propofol as per anesthesia Pacer pads placed anterior and posterior chest.  Cardioverted 3 time(s).  Cardioverted at 200J.  Biphasic  Evaluation Findings: Post procedure EKG shows: NSR Complications: None Patient did tolerate procedure well.   Wakisha Alberts MD 09/13/2023 8:02 AM

## 2023-09-14 ENCOUNTER — Ambulatory Visit: Payer: Medicare HMO

## 2023-09-19 DIAGNOSIS — I10 Essential (primary) hypertension: Secondary | ICD-10-CM | POA: Diagnosis not present

## 2023-09-19 DIAGNOSIS — E7800 Pure hypercholesterolemia, unspecified: Secondary | ICD-10-CM | POA: Diagnosis not present

## 2023-09-19 DIAGNOSIS — M129 Arthropathy, unspecified: Secondary | ICD-10-CM | POA: Diagnosis not present

## 2023-09-19 DIAGNOSIS — R051 Acute cough: Secondary | ICD-10-CM | POA: Diagnosis not present

## 2023-09-19 DIAGNOSIS — U071 COVID-19: Secondary | ICD-10-CM | POA: Diagnosis not present

## 2023-09-19 DIAGNOSIS — F3341 Major depressive disorder, recurrent, in partial remission: Secondary | ICD-10-CM | POA: Diagnosis not present

## 2023-09-19 DIAGNOSIS — I4891 Unspecified atrial fibrillation: Secondary | ICD-10-CM | POA: Diagnosis not present

## 2023-09-19 DIAGNOSIS — E1140 Type 2 diabetes mellitus with diabetic neuropathy, unspecified: Secondary | ICD-10-CM | POA: Diagnosis not present

## 2023-09-26 ENCOUNTER — Ambulatory Visit: Payer: Medicare HMO

## 2023-09-26 DIAGNOSIS — Z7901 Long term (current) use of anticoagulants: Secondary | ICD-10-CM | POA: Diagnosis not present

## 2023-09-26 DIAGNOSIS — I4891 Unspecified atrial fibrillation: Secondary | ICD-10-CM | POA: Diagnosis not present

## 2023-09-28 ENCOUNTER — Ambulatory Visit: Payer: Medicare HMO

## 2023-10-03 ENCOUNTER — Ambulatory Visit: Payer: Medicare HMO

## 2023-10-04 DIAGNOSIS — E538 Deficiency of other specified B group vitamins: Secondary | ICD-10-CM | POA: Diagnosis not present

## 2023-10-05 ENCOUNTER — Ambulatory Visit: Payer: Medicare HMO

## 2023-10-26 DIAGNOSIS — Z7901 Long term (current) use of anticoagulants: Secondary | ICD-10-CM | POA: Diagnosis not present

## 2023-10-31 DIAGNOSIS — J449 Chronic obstructive pulmonary disease, unspecified: Secondary | ICD-10-CM | POA: Diagnosis not present

## 2023-10-31 DIAGNOSIS — E7800 Pure hypercholesterolemia, unspecified: Secondary | ICD-10-CM | POA: Diagnosis not present

## 2023-10-31 DIAGNOSIS — M129 Arthropathy, unspecified: Secondary | ICD-10-CM | POA: Diagnosis not present

## 2023-10-31 DIAGNOSIS — F3341 Major depressive disorder, recurrent, in partial remission: Secondary | ICD-10-CM | POA: Diagnosis not present

## 2023-10-31 DIAGNOSIS — I10 Essential (primary) hypertension: Secondary | ICD-10-CM | POA: Diagnosis not present

## 2023-10-31 DIAGNOSIS — E1140 Type 2 diabetes mellitus with diabetic neuropathy, unspecified: Secondary | ICD-10-CM | POA: Diagnosis not present

## 2023-10-31 DIAGNOSIS — I4891 Unspecified atrial fibrillation: Secondary | ICD-10-CM | POA: Diagnosis not present

## 2023-11-03 ENCOUNTER — Ambulatory Visit: Payer: Medicare HMO | Admitting: Dermatology

## 2023-11-08 ENCOUNTER — Ambulatory Visit: Payer: Medicare HMO | Admitting: Dermatology

## 2023-11-08 DIAGNOSIS — N401 Enlarged prostate with lower urinary tract symptoms: Secondary | ICD-10-CM | POA: Diagnosis not present

## 2023-11-08 DIAGNOSIS — Z23 Encounter for immunization: Secondary | ICD-10-CM | POA: Diagnosis not present

## 2023-11-08 DIAGNOSIS — N393 Stress incontinence (female) (male): Secondary | ICD-10-CM | POA: Diagnosis not present

## 2023-11-08 DIAGNOSIS — R350 Frequency of micturition: Secondary | ICD-10-CM | POA: Diagnosis not present

## 2023-11-08 DIAGNOSIS — I4891 Unspecified atrial fibrillation: Secondary | ICD-10-CM | POA: Diagnosis not present

## 2023-11-08 DIAGNOSIS — I10 Essential (primary) hypertension: Secondary | ICD-10-CM | POA: Diagnosis not present

## 2023-11-08 DIAGNOSIS — F419 Anxiety disorder, unspecified: Secondary | ICD-10-CM | POA: Diagnosis not present

## 2023-11-22 DIAGNOSIS — Z7901 Long term (current) use of anticoagulants: Secondary | ICD-10-CM | POA: Diagnosis not present

## 2023-11-28 DIAGNOSIS — M5126 Other intervertebral disc displacement, lumbar region: Secondary | ICD-10-CM | POA: Diagnosis not present

## 2023-11-28 DIAGNOSIS — M5416 Radiculopathy, lumbar region: Secondary | ICD-10-CM | POA: Diagnosis not present

## 2023-11-28 DIAGNOSIS — M1712 Unilateral primary osteoarthritis, left knee: Secondary | ICD-10-CM | POA: Diagnosis not present

## 2023-11-28 DIAGNOSIS — I4891 Unspecified atrial fibrillation: Secondary | ICD-10-CM | POA: Diagnosis not present

## 2023-11-28 DIAGNOSIS — Z79899 Other long term (current) drug therapy: Secondary | ICD-10-CM | POA: Diagnosis not present

## 2023-11-28 DIAGNOSIS — M62830 Muscle spasm of back: Secondary | ICD-10-CM | POA: Diagnosis not present

## 2023-11-28 DIAGNOSIS — M48062 Spinal stenosis, lumbar region with neurogenic claudication: Secondary | ICD-10-CM | POA: Diagnosis not present

## 2023-11-28 DIAGNOSIS — I10 Essential (primary) hypertension: Secondary | ICD-10-CM | POA: Diagnosis not present

## 2023-11-28 DIAGNOSIS — F419 Anxiety disorder, unspecified: Secondary | ICD-10-CM | POA: Diagnosis not present

## 2023-12-07 DIAGNOSIS — I4891 Unspecified atrial fibrillation: Secondary | ICD-10-CM | POA: Diagnosis not present

## 2023-12-09 DIAGNOSIS — M65331 Trigger finger, right middle finger: Secondary | ICD-10-CM | POA: Diagnosis not present

## 2023-12-22 ENCOUNTER — Other Ambulatory Visit (HOSPITAL_COMMUNITY): Payer: Self-pay

## 2023-12-22 DIAGNOSIS — I4891 Unspecified atrial fibrillation: Secondary | ICD-10-CM | POA: Diagnosis not present

## 2023-12-22 DIAGNOSIS — Z7901 Long term (current) use of anticoagulants: Secondary | ICD-10-CM | POA: Diagnosis not present

## 2023-12-23 ENCOUNTER — Other Ambulatory Visit (HOSPITAL_COMMUNITY): Payer: Self-pay

## 2023-12-27 DIAGNOSIS — J069 Acute upper respiratory infection, unspecified: Secondary | ICD-10-CM | POA: Diagnosis not present

## 2023-12-27 DIAGNOSIS — R058 Other specified cough: Secondary | ICD-10-CM | POA: Diagnosis not present

## 2023-12-27 DIAGNOSIS — J441 Chronic obstructive pulmonary disease with (acute) exacerbation: Secondary | ICD-10-CM | POA: Diagnosis not present

## 2023-12-28 ENCOUNTER — Other Ambulatory Visit (HOSPITAL_COMMUNITY): Payer: Self-pay

## 2023-12-28 MED ORDER — VENLAFAXINE HCL ER 75 MG PO CP24: ORAL_CAPSULE | ORAL | 3 refills | Status: DC

## 2023-12-28 MED ORDER — POTASSIUM CHLORIDE ER 10 MEQ PO TBCR: 10.0000 meq | EXTENDED_RELEASE_TABLET | Freq: Every day | ORAL | 3 refills | Status: DC

## 2023-12-28 MED ORDER — GABAPENTIN 300 MG PO CAPS
300.0000 mg | ORAL_CAPSULE | Freq: Two times a day (BID) | ORAL | 3 refills | Status: DC
  Filled 2024-04-02: qty 180, 90d supply, fill #0
  Filled 2024-06-25: qty 180, 90d supply, fill #1

## 2023-12-28 MED ORDER — BENAZEPRIL HCL 5 MG PO TABS: 5.0000 mg | ORAL_TABLET | Freq: Every day | ORAL | 3 refills | Status: DC

## 2023-12-28 MED ORDER — CELECOXIB 200 MG PO CAPS: 200.0000 mg | ORAL_CAPSULE | Freq: Every day | ORAL | 3 refills | Status: DC

## 2023-12-28 MED ORDER — TORSEMIDE 10 MG PO TABS: 10.0000 mg | ORAL_TABLET | Freq: Every day | ORAL | 3 refills | Status: DC

## 2023-12-28 MED ORDER — ALLOPURINOL 300 MG PO TABS
300.0000 mg | ORAL_TABLET | Freq: Every day | ORAL | 3 refills | Status: DC
  Filled 2024-01-17: qty 90, 90d supply, fill #0
  Filled 2024-03-22 – 2024-03-26 (×2): qty 90, 90d supply, fill #1
  Filled 2024-06-04: qty 90, 90d supply, fill #2

## 2023-12-28 MED ORDER — TAMSULOSIN HCL 0.4 MG PO CAPS
0.4000 mg | ORAL_CAPSULE | Freq: Every day | ORAL | 3 refills | Status: DC
  Filled 2024-03-27: qty 90, 90d supply, fill #0

## 2023-12-28 MED ORDER — PANTOPRAZOLE SODIUM 40 MG PO TBEC
40.0000 mg | DELAYED_RELEASE_TABLET | Freq: Every day | ORAL | 1 refills | Status: DC
  Filled 2024-01-16: qty 90, 90d supply, fill #0

## 2023-12-29 ENCOUNTER — Other Ambulatory Visit (HOSPITAL_COMMUNITY): Payer: Self-pay

## 2024-01-02 ENCOUNTER — Other Ambulatory Visit (HOSPITAL_COMMUNITY): Payer: Self-pay

## 2024-01-02 DIAGNOSIS — I4819 Other persistent atrial fibrillation: Secondary | ICD-10-CM | POA: Insufficient documentation

## 2024-01-02 NOTE — Progress Notes (Signed)
 Cardiology Office Note  Date:  01/03/2024   ID:  CHRISTOP [name], DOB May 06, 1946, MRN [id number]  PCP:  Adrian Cheryl BRAVO, MD   Chief Complaint  Patient presents with   New Patient (Initial Visit)    Self referral to establish care for paroxysmal atrial fibrillation. Former patient of Dr. Florencio.     HPI:  [name] is a 78 year old gentleman with past medical history of Hypertension COPD Smoking/COPD Sleep apnea DVT/PE Paroxysmal atrial fibrillation, cardioversion 3/22 and September 13, 2023 Who presents by referral from Dr. Jeffie for paroxysmal atrial fibrillation  Atrial fibrillation first noted June 2024 on EKG Also seen on EKG August 2024  On warfarin and metoprolol  tartrate 25 twice daily for A-fib PMD monitors INR  Echocardiogram September 2024 EF greater than 55%, normal RV function Mildly dilated left atrium Noted to be in atrial fibrillation during the exam  Underwent cardioversion September 13, 2023 Normal sinus rhythm restored  Rare short chest pain, at night, Sleeps on the side  A1C 6.4 Total chol 176, LDL 77  EKG personally reviewed by myself on todays visit EKG Interpretation Date/Time:  Tuesday January 03 2024 11:15:44 EST Ventricular Rate:  54 PR Interval:  140 QRS Duration:  92 QT Interval:  448 QTC Calculation: 424 R Axis:   -27  Text Interpretation: Sinus bradycardia When compared with ECG of 13-Sep-2023 07:49, No significant change was found Confirmed by Perla Lye [phone]) on 01/03/2024 11:18:02 AM    PMH:   has a past medical history of Anxiety, Arthritis, Atypical mole (10/22/2015), Basal cell carcinoma of skin (01/29/2010), Basal cell carcinoma of skin (04/22/2015), Basal cell carcinoma of skin (11/25/2015), Colon polyps, Depression, GERD (gastroesophageal reflux disease), Gout, Hemorrhoids, History of basal cell carcinoma (BCC) (05/15/2020), Hypertension, Lumbar radiculopathy (09/25/2014), Neuropathy, Osteoarthritis,  Pneumonia, Pulmonary embolism (HCC) (05/25/2014), Reflux, Sleep apnea, and Umbilical hernia.  PSH:    Past Surgical History:  Procedure Laterality Date   CARDIOVERSION N/A 03/18/2021   Procedure: CARDIOVERSION;  Surgeon: Hester Wolm PARAS, MD;  Location: [facility];  Service: Cardiovascular;  Laterality: N/A;   CARDIOVERSION N/A 09/13/2023   Procedure: CARDIOVERSION;  Surgeon: Adrian [name] Cara BIRCH, MD;  Location: [facility];  Service: Cardiovascular;  Laterality: N/A;   COLONOSCOPY     ESOPHAGOGASTRODUODENOSCOPY     HERNIA REPAIR     umbilical   IVC FILTER INSERTION  04/15/2014   Bard Denali IVC filter by Dr. Marea   TOTAL HIP ARTHROPLASTY Right 01/02/2019   Procedure: TOTAL HIP ARTHROPLASTY ANTERIOR APPROACH;  Surgeon: Kathlynn Sharper, MD;  Location: [facility];  Service: Orthopedics;  Laterality: Right;    Current Outpatient Medications  Medication Sig Dispense Refill   acetaminophen  (TYLENOL ) 500 MG tablet Take 1,000 mg by mouth daily.     allopurinol  (ZYLOPRIM ) 300 MG tablet Take 1 tablet (300 mg total) by mouth daily. 90 tablet 3   amLODipine  (NORVASC ) 2.5 MG tablet Take 2.5 mg by mouth daily.      benazepril  (LOTENSIN ) 5 MG tablet Take 1 tablet (5 mg total) by mouth daily. 90 tablet 3   bisacodyl  (DULCOLAX) 5 MG EC tablet Take 1 tablet (5 mg total) by mouth daily as needed for moderate constipation. 30 tablet 0   busPIRone (BUSPAR) 5 MG tablet Take 1 tablet by mouth 2 (two) times daily.     celecoxib  (CELEBREX ) 200 MG capsule Take 1 capsule (200 mg total) by mouth daily. 90 capsule 3   Cholecalciferol (VITAMIN D3) 50 MCG (2000 UT) TABS Take  2,000 mg by mouth daily.     dutasteride (AVODART) 0.5 MG capsule Take 0.5 mg by mouth daily.     gabapentin  (NEURONTIN ) 300 MG capsule Take 1 capsule (300 mg total) by mouth 2 (two) times daily. 180 capsule 3   HYDROcodone -acetaminophen  (NORCO) 10-325 MG tablet Take 1 tablet by mouth 2 (two) times daily as needed for pain.     metoprolol  tartrate (LOPRESSOR )  25 MG tablet Take 25 mg by mouth 2 (two) times daily.     Multiple Vitamin (MULTIVITAMIN WITH MINERALS) TABS tablet Take 1 tablet by mouth daily. One-A-Day     naloxone (NARCAN) nasal spray 4 mg/0.1 mL Place 1 spray into the nose once.     pantoprazole  (PROTONIX ) 40 MG tablet Take 1 tablet (40 mg total) by mouth daily. 90 tablet 1   potassium chloride  (KLOR-CON ) 10 MEQ tablet Take 1 tablet (10 mEq total) by mouth daily. 90 tablet 3   Probiotic Product (PHILLIPS COLON HEALTH) CAPS Take 1 capsule by mouth daily.     tamsulosin  (FLOMAX ) 0.4 MG CAPS capsule Take 1 capsule (0.4 mg total) by mouth 30 minutes after the same meal each daiy for prostate. 90 capsule 3   torsemide  (DEMADEX ) 10 MG tablet Take 1 tablet (10 mg total) by mouth daily. 90 tablet 3   venlafaxine  XR (EFFEXOR -XR) 75 MG 24 hr capsule Take 75 mg by mouth 2 (two) times daily.      vitamin B-12 (CYANOCOBALAMIN ) 1000 MCG tablet Take 1,000 mcg by mouth daily.     warfarin (COUMADIN ) 6 MG tablet Take 3-6 mg by mouth See admin instructions. Take 6 mg every day except: Monday and Thursday Take 3 mg     Adapalene  (DIFFERIN ) 0.3 % gel Apply 1 application topically at bedtime. (Patient not taking: Reported on 03/12/2021) 45 g 2   allopurinol  (ZYLOPRIM ) 300 MG tablet Take 300 mg by mouth daily. (Patient not taking: Reported on 01/03/2024)     fluocinonide  gel (LIDEX ) 0.05 % Apply 1 application topically as directed. Qd prn (Patient not taking: Reported on 09/13/2023) 30 g 1   gabapentin  (NEURONTIN ) 300 MG capsule Take 300 mg by mouth 2 (two) times daily.  (Patient not taking: Reported on 01/03/2024)     mometasone  (ELOCON ) 0.1 % cream Apply 1 Application topically as directed. Qd up to 5 days a week to aa rash on chest prn itchy flares (Patient not taking: Reported on 09/13/2023) 45 g 1   No current facility-administered medications for this visit.     Allergies:   Shellfish allergy, Duloxetine, Pseudoephedrine hcl, Pseudoephedrine, and  Pseudoephedrine hcl   Social History:  The patient  reports that he has been smoking cigarettes. He has a 5.4 pack-year smoking history. He has never used smokeless tobacco. He reports that he does not currently use alcohol. He reports that he does not use drugs.   Family History:   family history includes Cancer in his father; Diabetes in his mother.    Review of Systems: Review of Systems  Constitutional: Negative.   HENT: Negative.    Respiratory: Negative.    Cardiovascular: Negative.   Gastrointestinal: Negative.   Musculoskeletal: Negative.   Neurological: Negative.   Psychiatric/Behavioral: Negative.    All other systems reviewed and are negative.    PHYSICAL EXAM: VS:  BP 136/70 (BP Location: Right Arm, Patient Position: Sitting, Cuff Size: Large)   Pulse (!) 54   Ht 5' 11 (1.803 m)   Wt (!) 307 lb 2 oz (  139.3 kg)   SpO2 98%   BMI 42.84 kg/m  , BMI Body mass index is 42.84 kg/m. GEN: Well nourished, well developed, in no acute distress HEENT: normal Neck: no JVD, carotid bruits, or masses Cardiac: RRR; no murmurs, rubs, or gallops,no edema  Respiratory:  clear to auscultation bilaterally, normal work of breathing GI: soft, nontender, nondistended, + BS MS: no deformity or atrophy Skin: warm and dry, no rash Neuro:  Strength and sensation are intact Psych: euthymic mood, full affect   Recent Labs: No results found for requested labs within last 365 days.    Lipid Panel Lab Results  Component Value Date   CHOL 200 08/10/2012   HDL 39 (L) 08/10/2012   LDLCALC 142 (H) 08/10/2012   TRIG 94 08/10/2012      Wt Readings from Last 3 Encounters:  01/03/24 (!) 307 lb 2 oz (139.3 kg)  09/13/23 300 lb (136.1 kg)  03/18/21 300 lb (136.1 kg)       ASSESSMENT AND PLAN:  Problem List Items Addressed This Visit       Cardiology Problems   Persistent atrial fibrillation (HCC) - Primary   Relevant Orders   EKG 12-Lead (Completed)   Pulmonary embolism  (HCC)   Relevant Orders   EKG 12-Lead (Completed)   Benign hypertension   Relevant Orders   EKG 12-Lead (Completed)     Other   Morbid (severe) obesity due to excess calories (HCC)   Mixed simple and mucopurulent chronic bronchitis (HCC)   Obstructive apnea   Persistent atrial fibrillation Risk factors include morbid obesity, sleep apnea, age High risk of recurrent arrhythmia Maintain normal sinus rhythm on metoprolol  tartrate 25 twice daily, on warfarin but considering transition to Eliquis  (will depend on cost) No medication changes made  History of DVT/PE On warfarin  Essential hypertension Blood pressure is well controlled on today's visit. No changes made to the medications.  Morbid obesity Reports daily use of Pepsi Recommended transitioning to water, low calorie drinks Also reports high bread intake, recommend he cut his carbohydrates  Former smoker/COPD Reports remote history of smoking Weight loss and walking program recommended   Signed, Velinda Lunger, M.D., Ph.D. [facility] [place], [place] [phone]

## 2024-01-03 ENCOUNTER — Ambulatory Visit: Payer: HMO | Attending: Cardiovascular Disease | Admitting: Cardiovascular Disease

## 2024-01-03 ENCOUNTER — Other Ambulatory Visit: Payer: Self-pay

## 2024-01-03 ENCOUNTER — Other Ambulatory Visit (HOSPITAL_COMMUNITY): Payer: Self-pay

## 2024-01-03 ENCOUNTER — Encounter: Payer: Self-pay | Admitting: Cardiovascular Disease

## 2024-01-03 VITALS — BP 136/70 | HR 54 | Ht 71.0 in | Wt 307.1 lb

## 2024-01-03 DIAGNOSIS — J418 Mixed simple and mucopurulent chronic bronchitis: Secondary | ICD-10-CM

## 2024-01-03 DIAGNOSIS — I4819 Other persistent atrial fibrillation: Secondary | ICD-10-CM

## 2024-01-03 DIAGNOSIS — I2782 Chronic pulmonary embolism: Secondary | ICD-10-CM

## 2024-01-03 DIAGNOSIS — I10 Essential (primary) hypertension: Secondary | ICD-10-CM | POA: Diagnosis not present

## 2024-01-03 DIAGNOSIS — G4733 Obstructive sleep apnea (adult) (pediatric): Secondary | ICD-10-CM

## 2024-01-03 MED ORDER — METOPROLOL TARTRATE 25 MG PO TABS
25.0000 mg | ORAL_TABLET | Freq: Two times a day (BID) | ORAL | 3 refills | Status: DC
  Filled 2024-01-03 (×2): qty 180, 90d supply, fill #0
  Filled 2024-03-22: qty 180, 90d supply, fill #1
  Filled 2024-06-04: qty 180, 90d supply, fill #2
  Filled 2024-09-23: qty 180, 90d supply, fill #3

## 2024-01-03 MED ORDER — BENAZEPRIL HCL 5 MG PO TABS
5.0000 mg | ORAL_TABLET | Freq: Every day | ORAL | 3 refills | Status: DC
  Filled 2024-01-03 (×2): qty 90, 90d supply, fill #0
  Filled 2024-03-22: qty 90, 90d supply, fill #1
  Filled 2024-06-04: qty 90, 90d supply, fill #2

## 2024-01-03 MED ORDER — POTASSIUM CHLORIDE ER 10 MEQ PO TBCR
10.0000 meq | EXTENDED_RELEASE_TABLET | Freq: Every day | ORAL | 3 refills | Status: DC
  Filled 2024-01-03 (×2): qty 90, 90d supply, fill #0
  Filled 2024-03-22: qty 90, 90d supply, fill #1
  Filled 2024-06-04: qty 90, 90d supply, fill #2
  Filled 2024-09-23: qty 90, 90d supply, fill #3

## 2024-01-03 MED ORDER — AMLODIPINE BESYLATE 2.5 MG PO TABS
2.5000 mg | ORAL_TABLET | Freq: Every day | ORAL | 3 refills | Status: DC
  Filled 2024-01-03 (×2): qty 90, 90d supply, fill #0
  Filled 2024-03-22: qty 90, 90d supply, fill #1
  Filled 2024-06-04: qty 90, 90d supply, fill #2
  Filled 2024-09-23: qty 90, 90d supply, fill #3

## 2024-01-03 MED ORDER — TORSEMIDE 10 MG PO TABS
10.0000 mg | ORAL_TABLET | Freq: Every day | ORAL | 3 refills | Status: DC
  Filled 2024-01-03 (×2): qty 90, 90d supply, fill #0
  Filled 2024-03-22: qty 90, 90d supply, fill #1
  Filled 2024-06-04: qty 90, 90d supply, fill #2
  Filled 2024-09-23: qty 90, 90d supply, fill #3

## 2024-01-03 NOTE — Patient Instructions (Addendum)
 Stop pepsi and bread Try diet Dr Nunzio  Medication Instructions:  No changes  If you need a refill on your cardiac medications before your next appointment, please call your pharmacy.   Lab work: No new labs needed  Testing/Procedures: No new testing needed  Follow-Up: At [facility], you and your health needs are our priority.  As part of our continuing mission to provide you with exceptional heart care, we have created designated Provider Care Teams.  These Care Teams include your primary Cardiologist (physician) and Advanced Practice Providers (APPs -  Physician Assistants and Nurse Practitioners) who all work together to provide you with the care you need, when you need it.  You will need a follow up appointment in 6 months  Providers on your designated Care Team:   Lonni Meager, NP Bernardino Bring, PA-C Cadence [place], [place]  COVID-19 Vaccine Information can be found at: [email or web address] For questions related to vaccine distribution or appointments, please email [email or web address] or call [phone].

## 2024-01-04 ENCOUNTER — Other Ambulatory Visit: Payer: Self-pay

## 2024-01-04 DIAGNOSIS — G4733 Obstructive sleep apnea (adult) (pediatric): Secondary | ICD-10-CM | POA: Diagnosis not present

## 2024-01-04 DIAGNOSIS — R2689 Other abnormalities of gait and mobility: Secondary | ICD-10-CM | POA: Diagnosis not present

## 2024-01-04 DIAGNOSIS — N5314 Retrograde ejaculation: Secondary | ICD-10-CM | POA: Diagnosis not present

## 2024-01-04 DIAGNOSIS — R051 Acute cough: Secondary | ICD-10-CM | POA: Diagnosis not present

## 2024-01-04 DIAGNOSIS — E1140 Type 2 diabetes mellitus with diabetic neuropathy, unspecified: Secondary | ICD-10-CM | POA: Diagnosis not present

## 2024-01-04 DIAGNOSIS — E569 Vitamin deficiency, unspecified: Secondary | ICD-10-CM | POA: Diagnosis not present

## 2024-01-04 DIAGNOSIS — R29898 Other symptoms and signs involving the musculoskeletal system: Secondary | ICD-10-CM | POA: Diagnosis not present

## 2024-01-04 DIAGNOSIS — G629 Polyneuropathy, unspecified: Secondary | ICD-10-CM | POA: Diagnosis not present

## 2024-01-04 DIAGNOSIS — J0110 Acute frontal sinusitis, unspecified: Secondary | ICD-10-CM | POA: Diagnosis not present

## 2024-01-04 DIAGNOSIS — R350 Frequency of micturition: Secondary | ICD-10-CM | POA: Diagnosis not present

## 2024-01-04 DIAGNOSIS — N401 Enlarged prostate with lower urinary tract symptoms: Secondary | ICD-10-CM | POA: Diagnosis not present

## 2024-01-13 DIAGNOSIS — F419 Anxiety disorder, unspecified: Secondary | ICD-10-CM | POA: Diagnosis not present

## 2024-01-13 DIAGNOSIS — I4891 Unspecified atrial fibrillation: Secondary | ICD-10-CM | POA: Diagnosis not present

## 2024-01-13 DIAGNOSIS — I10 Essential (primary) hypertension: Secondary | ICD-10-CM | POA: Diagnosis not present

## 2024-01-16 ENCOUNTER — Other Ambulatory Visit (HOSPITAL_COMMUNITY): Payer: Self-pay

## 2024-01-16 ENCOUNTER — Other Ambulatory Visit: Payer: Self-pay

## 2024-01-17 ENCOUNTER — Other Ambulatory Visit (HOSPITAL_COMMUNITY): Payer: Self-pay

## 2024-01-17 ENCOUNTER — Other Ambulatory Visit: Payer: Self-pay

## 2024-01-23 ENCOUNTER — Other Ambulatory Visit (HOSPITAL_COMMUNITY): Payer: Self-pay

## 2024-01-23 DIAGNOSIS — Z7901 Long term (current) use of anticoagulants: Secondary | ICD-10-CM | POA: Diagnosis not present

## 2024-01-26 ENCOUNTER — Other Ambulatory Visit (HOSPITAL_COMMUNITY): Payer: Self-pay

## 2024-01-26 ENCOUNTER — Telehealth: Payer: Self-pay | Admitting: Cardiovascular Disease

## 2024-01-26 DIAGNOSIS — I4891 Unspecified atrial fibrillation: Secondary | ICD-10-CM | POA: Diagnosis not present

## 2024-01-26 DIAGNOSIS — F419 Anxiety disorder, unspecified: Secondary | ICD-10-CM | POA: Diagnosis not present

## 2024-01-26 DIAGNOSIS — I10 Essential (primary) hypertension: Secondary | ICD-10-CM | POA: Diagnosis not present

## 2024-01-26 NOTE — Telephone Encounter (Signed)
 Pt requesting to switch from Warfarin to Eliquis  or Xarelto. This would need to be approved by MD. Will forward to Dr Gollan.

## 2024-01-26 NOTE — Telephone Encounter (Signed)
 Pt c/o medication issue:  1. Name of Medication:  warfarin (COUMADIN ) 6 MG tablet  2. How are you currently taking this medication (dosage and times per day)?   3. Are you having a reaction (difficulty breathing--STAT)?   4. What is your medication issue?   Patient states he would like to discuss being switched from Warfarin to Xarelto or Eliquis  to avoid having INR checks

## 2024-01-27 ENCOUNTER — Other Ambulatory Visit (HOSPITAL_COMMUNITY): Payer: Self-pay

## 2024-01-27 MED ORDER — APIXABAN 5 MG PO TABS
5.0000 mg | ORAL_TABLET | Freq: Two times a day (BID) | ORAL | 3 refills | Status: DC
  Filled 2024-01-27: qty 180, 90d supply, fill #0
  Filled 2024-04-25: qty 180, 90d supply, fill #1
  Filled 2024-07-24: qty 180, 90d supply, fill #2
  Filled 2024-10-22: qty 180, 90d supply, fill #3

## 2024-01-27 MED ORDER — HYDROCODONE-ACETAMINOPHEN 10-325 MG PO TABS
0.5000 | ORAL_TABLET | Freq: Three times a day (TID) | ORAL | 0 refills | Status: DC | PRN
  Filled 2024-02-10: qty 75, 25d supply, fill #0

## 2024-01-27 NOTE — Telephone Encounter (Signed)
 Called patient and notified him of the following from [name].  Okay to switch from warfarin to Eliquis  5 twice daily  Would hold warfarin and after 4 days could start Eliquis  5 twice daily  Thx  Tgollan   Patient verbalizes understanding. Prescription sent to preferred pharmacy.

## 2024-01-30 ENCOUNTER — Other Ambulatory Visit: Payer: Self-pay

## 2024-02-01 ENCOUNTER — Other Ambulatory Visit: Payer: Self-pay

## 2024-02-01 ENCOUNTER — Other Ambulatory Visit (HOSPITAL_COMMUNITY): Payer: Self-pay

## 2024-02-02 ENCOUNTER — Other Ambulatory Visit (HOSPITAL_COMMUNITY): Payer: Self-pay

## 2024-02-02 ENCOUNTER — Other Ambulatory Visit (HOSPITAL_BASED_OUTPATIENT_CLINIC_OR_DEPARTMENT_OTHER): Payer: Self-pay

## 2024-02-06 ENCOUNTER — Other Ambulatory Visit (HOSPITAL_COMMUNITY): Payer: Self-pay

## 2024-02-06 ENCOUNTER — Other Ambulatory Visit: Payer: Self-pay

## 2024-02-10 ENCOUNTER — Other Ambulatory Visit (HOSPITAL_COMMUNITY): Payer: Self-pay

## 2024-02-21 DIAGNOSIS — C4491 Basal cell carcinoma of skin, unspecified: Secondary | ICD-10-CM

## 2024-02-21 DIAGNOSIS — Z7901 Long term (current) use of anticoagulants: Secondary | ICD-10-CM | POA: Diagnosis not present

## 2024-02-21 HISTORY — DX: Basal cell carcinoma of skin, unspecified: C44.91

## 2024-02-23 ENCOUNTER — Encounter: Payer: Self-pay | Admitting: Dermatology

## 2024-02-23 ENCOUNTER — Ambulatory Visit: Payer: Medicare HMO | Admitting: Dermatology

## 2024-02-23 DIAGNOSIS — Z85828 Personal history of other malignant neoplasm of skin: Secondary | ICD-10-CM | POA: Diagnosis not present

## 2024-02-23 DIAGNOSIS — D1801 Hemangioma of skin and subcutaneous tissue: Secondary | ICD-10-CM

## 2024-02-23 DIAGNOSIS — D229 Melanocytic nevi, unspecified: Secondary | ICD-10-CM

## 2024-02-23 DIAGNOSIS — E1140 Type 2 diabetes mellitus with diabetic neuropathy, unspecified: Secondary | ICD-10-CM | POA: Diagnosis not present

## 2024-02-23 DIAGNOSIS — W908XXA Exposure to other nonionizing radiation, initial encounter: Secondary | ICD-10-CM

## 2024-02-23 DIAGNOSIS — I4891 Unspecified atrial fibrillation: Secondary | ICD-10-CM | POA: Diagnosis not present

## 2024-02-23 DIAGNOSIS — E7800 Pure hypercholesterolemia, unspecified: Secondary | ICD-10-CM | POA: Diagnosis not present

## 2024-02-23 DIAGNOSIS — Z1283 Encounter for screening for malignant neoplasm of skin: Secondary | ICD-10-CM | POA: Diagnosis not present

## 2024-02-23 DIAGNOSIS — L720 Epidermal cyst: Secondary | ICD-10-CM

## 2024-02-23 DIAGNOSIS — L918 Other hypertrophic disorders of the skin: Secondary | ICD-10-CM

## 2024-02-23 DIAGNOSIS — F419 Anxiety disorder, unspecified: Secondary | ICD-10-CM | POA: Diagnosis not present

## 2024-02-23 DIAGNOSIS — L814 Other melanin hyperpigmentation: Secondary | ICD-10-CM | POA: Diagnosis not present

## 2024-02-23 DIAGNOSIS — I10 Essential (primary) hypertension: Secondary | ICD-10-CM | POA: Diagnosis not present

## 2024-02-23 DIAGNOSIS — L578 Other skin changes due to chronic exposure to nonionizing radiation: Secondary | ICD-10-CM

## 2024-02-23 DIAGNOSIS — L729 Follicular cyst of the skin and subcutaneous tissue, unspecified: Secondary | ICD-10-CM

## 2024-02-23 DIAGNOSIS — N401 Enlarged prostate with lower urinary tract symptoms: Secondary | ICD-10-CM | POA: Diagnosis not present

## 2024-02-23 DIAGNOSIS — G4733 Obstructive sleep apnea (adult) (pediatric): Secondary | ICD-10-CM | POA: Diagnosis not present

## 2024-02-23 DIAGNOSIS — C44311 Basal cell carcinoma of skin of nose: Secondary | ICD-10-CM | POA: Diagnosis not present

## 2024-02-23 DIAGNOSIS — L820 Inflamed seborrheic keratosis: Secondary | ICD-10-CM

## 2024-02-23 DIAGNOSIS — J449 Chronic obstructive pulmonary disease, unspecified: Secondary | ICD-10-CM | POA: Diagnosis not present

## 2024-02-23 DIAGNOSIS — D492 Neoplasm of unspecified behavior of bone, soft tissue, and skin: Secondary | ICD-10-CM

## 2024-02-23 DIAGNOSIS — M1A00X0 Idiopathic chronic gout, unspecified site, without tophus (tophi): Secondary | ICD-10-CM | POA: Diagnosis not present

## 2024-02-23 DIAGNOSIS — L821 Other seborrheic keratosis: Secondary | ICD-10-CM

## 2024-02-23 DIAGNOSIS — D485 Neoplasm of uncertain behavior of skin: Secondary | ICD-10-CM

## 2024-02-23 DIAGNOSIS — Z86018 Personal history of other benign neoplasm: Secondary | ICD-10-CM

## 2024-02-23 DIAGNOSIS — M129 Arthropathy, unspecified: Secondary | ICD-10-CM | POA: Diagnosis not present

## 2024-02-23 DIAGNOSIS — F3341 Major depressive disorder, recurrent, in partial remission: Secondary | ICD-10-CM | POA: Diagnosis not present

## 2024-02-23 NOTE — Progress Notes (Signed)
 Follow-Up Visit   Subjective  Adrian [name] is a 78 y.o. male who presents for the following: Skin Cancer Screening and Upper Body Skin Exam  The patient presents for Upper Body Skin Exam (UBSE) for skin cancer screening and mole check. The patient has spots, moles and lesions to be evaluated, some may be new or changing and the patient may have concern these could be cancer.  The following portions of the chart were reviewed this encounter and updated as appropriate: medications, allergies, medical history  Review of Systems:  No other skin or systemic complaints except as noted in HPI or Assessment and Plan.  Objective  Well appearing patient in no apparent distress; mood and affect are within normal limits.  All skin waist up examined. Relevant physical exam findings are noted in the Assessment and Plan.  L nasal ala 1.1 cm Pearly pink papule  Face x 5 (5) Stuck on waxy paps with erythema Neck x 4 Fleshy, skin-colored pedunculated papules.    Assessment & Plan   NEOPLASM OF UNCERTAIN BEHAVIOR OF SKIN L nasal ala Epidermal / dermal shaving  Lesion diameter (cm):  1.1 Informed consent: discussed and consent obtained   Timeout: patient name, date of birth, surgical site, and procedure verified   Procedure prep:  Patient was prepped and draped in usual sterile fashion Prep type:  Isopropyl alcohol Anesthesia: the lesion was anesthetized in a standard fashion   Anesthetic:  1% lidocaine w/ epinephrine 1-100,000 buffered w/ 8.4% NaHCO3 Instrument used: flexible razor blade   Hemostasis achieved with: pressure, aluminum chloride and electrodesiccation   Outcome: patient tolerated procedure well   Post-procedure details: sterile dressing applied and wound care instructions given   Dressing type: bandage and petrolatum    Destruction of lesion Complexity: extensive   Destruction method: electrodesiccation and curettage   Informed consent: discussed and consent obtained    Timeout:  patient name, date of birth, surgical site, and procedure verified Procedure prep:  Patient was prepped and draped in usual sterile fashion Prep type:  Isopropyl alcohol Anesthesia: the lesion was anesthetized in a standard fashion   Anesthetic:  1% lidocaine w/ epinephrine 1-100,000 buffered w/ 8.4% NaHCO3 Curettage performed in three different directions: Yes   Electrodesiccation performed over the curetted area: Yes   Final wound size (cm):  1.1 Hemostasis achieved with:  pressure, aluminum chloride and electrodesiccation Outcome: patient tolerated procedure well with no complications   Post-procedure details: sterile dressing applied and wound care instructions given   Dressing type: bandage and petrolatum   Specimen 1 - Surgical pathology Differential Diagnosis: D48.5 r/o BCC ED&C today Check Margins: No INFLAMED SEBORRHEIC KERATOSIS (5) Face x 5 (5) Symptomatic, irritating, patient would like treated.  Destruction of lesion - Face x 5 (5) Complexity: simple   Destruction method: cryotherapy   Informed consent: discussed and consent obtained   Timeout:  patient name, date of birth, surgical site, and procedure verified Lesion destroyed using liquid nitrogen: Yes   Region frozen until ice ball extended beyond lesion: Yes   Outcome: patient tolerated procedure well with no complications   Post-procedure details: wound care instructions given   SKIN TAG Neck x 4 Symptomatic, irritating, patient would like treated.  Destruction of lesion - Neck x 4 Complexity: simple   Destruction method: cryotherapy   Informed consent: discussed and consent obtained   Timeout:  patient name, date of birth, surgical site, and procedure verified Lesion destroyed using liquid nitrogen: Yes   Region frozen  until ice ball extended beyond lesion: Yes   Outcome: patient tolerated procedure well with no complications   Post-procedure details: wound care instructions given   SKIN CANCER  SCREENING   ACTINIC SKIN DAMAGE   LENTIGO   MELANOCYTIC NEVUS, UNSPECIFIED LOCATION   HISTORY OF BASAL CELL CARCINOMA   HISTORY OF DYSPLASTIC NEVUS   CYST OF SKIN   Skin cancer screening performed today.  Actinic Damage - Chronic condition, secondary to cumulative UV/sun exposure - diffuse scaly erythematous macules with underlying dyspigmentation - Recommend daily broad spectrum sunscreen SPF 30+ to sun-exposed areas, reapply every 2 hours as needed.  - Staying in the shade or wearing long sleeves, sun glasses (UVA+UVB protection) and wide brim hats (4-inch brim around the entire circumference of the hat) are also recommended for sun protection.  - Call for new or changing lesions.  Lentigines, Seborrheic Keratoses, Hemangiomas - Benign normal skin lesions - Benign-appearing - Call for any changes  Melanocytic Nevi - Tan-brown and/or pink-flesh-colored symmetric macules and papules - Benign appearing on exam today - Observation - Call clinic for new or changing moles - Recommend daily use of broad spectrum spf 30+ sunscreen to sun-exposed areas.   HISTORY OF BASAL CELL CARCINOMA OF THE SKIN - No evidence of recurrence today - Recommend regular full body skin exams - Recommend daily broad spectrum sunscreen SPF 30+ to sun-exposed areas, reapply every 2 hours as needed.  - Call if any new or changing lesions are noted between office visits  HISTORY OF DYSPLASTIC NEVUS No evidence of recurrence today Recommend regular full body skin exams Recommend daily broad spectrum sunscreen SPF 30+ to sun-exposed areas, reapply every 2 hours as needed.  Call if any new or changing lesions are noted between office visits  EPIDERMAL INCLUSION CYST Exam: Subcutaneous nodule at the back  Benign-appearing. Exam most consistent with an epidermal inclusion cyst. Discussed that a cyst is a benign growth that can grow over time and sometimes get irritated or inflamed. Recommend  observation if it is not bothersome. Discussed option of surgical excision to remove it if it is growing, symptomatic, or other changes noted. Please call for new or changing lesions so they can be evaluated.  Return in about 1 year (around 02/22/2025) for UBSE.  Maylene Roes, CMA, am acting as scribe for Armida Sans, MD .  Documentation: I have reviewed the above documentation for accuracy and completeness, and I agree with the above.  Armida Sans, MD

## 2024-02-23 NOTE — Patient Instructions (Addendum)

## 2024-02-27 ENCOUNTER — Encounter: Payer: Self-pay | Admitting: Dermatology

## 2024-02-27 ENCOUNTER — Other Ambulatory Visit (HOSPITAL_COMMUNITY): Payer: Self-pay

## 2024-02-27 ENCOUNTER — Telehealth: Payer: Self-pay

## 2024-02-27 DIAGNOSIS — M5126 Other intervertebral disc displacement, lumbar region: Secondary | ICD-10-CM | POA: Diagnosis not present

## 2024-02-27 DIAGNOSIS — M5416 Radiculopathy, lumbar region: Secondary | ICD-10-CM | POA: Diagnosis not present

## 2024-02-27 DIAGNOSIS — M62830 Muscle spasm of back: Secondary | ICD-10-CM | POA: Diagnosis not present

## 2024-02-27 DIAGNOSIS — Z79899 Other long term (current) drug therapy: Secondary | ICD-10-CM | POA: Diagnosis not present

## 2024-02-27 DIAGNOSIS — M48062 Spinal stenosis, lumbar region with neurogenic claudication: Secondary | ICD-10-CM | POA: Diagnosis not present

## 2024-02-27 LAB — SURGICAL PATHOLOGY

## 2024-02-27 MED ORDER — HYDROCODONE-ACETAMINOPHEN 10-325 MG PO TABS
0.5000 | ORAL_TABLET | Freq: Three times a day (TID) | ORAL | 0 refills | Status: DC | PRN
Start: 2024-05-09 — End: 2024-05-02

## 2024-02-27 MED ORDER — HYDROCODONE-ACETAMINOPHEN 10-325 MG PO TABS
0.5000 | ORAL_TABLET | Freq: Three times a day (TID) | ORAL | 0 refills | Status: DC | PRN
  Filled 2024-03-13: qty 75, 25d supply, fill #0

## 2024-02-27 MED ORDER — HYDROCODONE-ACETAMINOPHEN 10-325 MG PO TABS
0.5000 | ORAL_TABLET | Freq: Three times a day (TID) | ORAL | 0 refills | Status: DC | PRN
Start: 2024-04-09 — End: 2024-04-02

## 2024-02-27 NOTE — Telephone Encounter (Addendum)
 Tried calling patient regarding bx results. No answer. LM for patient to return call.   ----- Message from Armida Sans sent at 02/27/2024  5:28 PM EDT ----- FINAL DIAGNOSIS        1. Skin, L nasal ala :       BASAL CELL CARCINOMA, NODULAR PATTERN   Cancer = BCC Already treated Recheck next visit

## 2024-02-28 ENCOUNTER — Other Ambulatory Visit (HOSPITAL_COMMUNITY): Payer: Self-pay

## 2024-02-28 ENCOUNTER — Telehealth: Payer: Self-pay

## 2024-02-28 DIAGNOSIS — E1140 Type 2 diabetes mellitus with diabetic neuropathy, unspecified: Secondary | ICD-10-CM | POA: Diagnosis not present

## 2024-02-28 DIAGNOSIS — Z7901 Long term (current) use of anticoagulants: Secondary | ICD-10-CM | POA: Diagnosis not present

## 2024-02-28 DIAGNOSIS — I4891 Unspecified atrial fibrillation: Secondary | ICD-10-CM | POA: Diagnosis not present

## 2024-02-28 DIAGNOSIS — E7800 Pure hypercholesterolemia, unspecified: Secondary | ICD-10-CM | POA: Diagnosis not present

## 2024-02-28 DIAGNOSIS — I10 Essential (primary) hypertension: Secondary | ICD-10-CM | POA: Diagnosis not present

## 2024-02-28 NOTE — Telephone Encounter (Addendum)
 Called and discussed bx results with patient. He verbalized understanding. Denied further questions at this time. Will recheck at next follow up.  ----- Message from Armida Sans sent at 02/27/2024  5:28 PM EDT ----- FINAL DIAGNOSIS        1. Skin, L nasal ala :       BASAL CELL CARCINOMA, NODULAR PATTERN   Cancer = BCC Already treated Recheck next visit

## 2024-03-06 DIAGNOSIS — F3341 Major depressive disorder, recurrent, in partial remission: Secondary | ICD-10-CM | POA: Diagnosis not present

## 2024-03-06 DIAGNOSIS — J449 Chronic obstructive pulmonary disease, unspecified: Secondary | ICD-10-CM | POA: Diagnosis not present

## 2024-03-06 DIAGNOSIS — I4891 Unspecified atrial fibrillation: Secondary | ICD-10-CM | POA: Diagnosis not present

## 2024-03-06 DIAGNOSIS — M129 Arthropathy, unspecified: Secondary | ICD-10-CM | POA: Diagnosis not present

## 2024-03-06 DIAGNOSIS — E1140 Type 2 diabetes mellitus with diabetic neuropathy, unspecified: Secondary | ICD-10-CM | POA: Diagnosis not present

## 2024-03-06 DIAGNOSIS — I10 Essential (primary) hypertension: Secondary | ICD-10-CM | POA: Diagnosis not present

## 2024-03-07 DIAGNOSIS — M5416 Radiculopathy, lumbar region: Secondary | ICD-10-CM | POA: Diagnosis not present

## 2024-03-07 DIAGNOSIS — M5126 Other intervertebral disc displacement, lumbar region: Secondary | ICD-10-CM | POA: Diagnosis not present

## 2024-03-07 DIAGNOSIS — M48062 Spinal stenosis, lumbar region with neurogenic claudication: Secondary | ICD-10-CM | POA: Diagnosis not present

## 2024-03-13 ENCOUNTER — Other Ambulatory Visit (HOSPITAL_COMMUNITY): Payer: Self-pay

## 2024-03-13 ENCOUNTER — Other Ambulatory Visit: Payer: Self-pay

## 2024-03-15 DIAGNOSIS — G4733 Obstructive sleep apnea (adult) (pediatric): Secondary | ICD-10-CM | POA: Diagnosis not present

## 2024-03-15 DIAGNOSIS — I10 Essential (primary) hypertension: Secondary | ICD-10-CM | POA: Diagnosis not present

## 2024-03-16 DIAGNOSIS — G629 Polyneuropathy, unspecified: Secondary | ICD-10-CM | POA: Diagnosis not present

## 2024-03-19 DIAGNOSIS — M1612 Unilateral primary osteoarthritis, left hip: Secondary | ICD-10-CM | POA: Diagnosis not present

## 2024-03-19 DIAGNOSIS — M25552 Pain in left hip: Secondary | ICD-10-CM | POA: Diagnosis not present

## 2024-03-22 ENCOUNTER — Other Ambulatory Visit (HOSPITAL_COMMUNITY): Payer: Self-pay

## 2024-03-22 ENCOUNTER — Other Ambulatory Visit: Payer: Self-pay

## 2024-03-22 MED ORDER — PANTOPRAZOLE SODIUM 40 MG PO TBEC
40.0000 mg | DELAYED_RELEASE_TABLET | Freq: Every day | ORAL | 3 refills | Status: DC
  Filled 2024-03-22 – 2024-03-26 (×3): qty 90, 90d supply, fill #0
  Filled 2024-06-04: qty 90, 90d supply, fill #1
  Filled 2024-08-15: qty 90, 90d supply, fill #2
  Filled 2024-10-10 – 2024-10-22 (×2): qty 90, 90d supply, fill #3

## 2024-03-23 ENCOUNTER — Other Ambulatory Visit (HOSPITAL_COMMUNITY): Payer: Self-pay

## 2024-03-26 ENCOUNTER — Other Ambulatory Visit (HOSPITAL_COMMUNITY): Payer: Self-pay

## 2024-03-27 ENCOUNTER — Other Ambulatory Visit (HOSPITAL_COMMUNITY): Payer: Self-pay

## 2024-03-28 DIAGNOSIS — R5381 Other malaise: Secondary | ICD-10-CM | POA: Diagnosis not present

## 2024-03-28 DIAGNOSIS — R0981 Nasal congestion: Secondary | ICD-10-CM | POA: Diagnosis not present

## 2024-03-28 DIAGNOSIS — J3489 Other specified disorders of nose and nasal sinuses: Secondary | ICD-10-CM | POA: Diagnosis not present

## 2024-03-28 DIAGNOSIS — J42 Unspecified chronic bronchitis: Secondary | ICD-10-CM | POA: Diagnosis not present

## 2024-03-28 DIAGNOSIS — J069 Acute upper respiratory infection, unspecified: Secondary | ICD-10-CM | POA: Diagnosis not present

## 2024-03-28 DIAGNOSIS — J441 Chronic obstructive pulmonary disease with (acute) exacerbation: Secondary | ICD-10-CM | POA: Diagnosis not present

## 2024-04-02 ENCOUNTER — Other Ambulatory Visit: Payer: Self-pay

## 2024-04-02 ENCOUNTER — Ambulatory Visit: Admitting: Urology

## 2024-04-02 ENCOUNTER — Other Ambulatory Visit (HOSPITAL_COMMUNITY): Payer: Self-pay

## 2024-04-02 VITALS — BP 157/84 | HR 67

## 2024-04-02 DIAGNOSIS — N393 Stress incontinence (female) (male): Secondary | ICD-10-CM | POA: Diagnosis not present

## 2024-04-02 DIAGNOSIS — R3912 Poor urinary stream: Secondary | ICD-10-CM

## 2024-04-02 LAB — URINALYSIS, COMPLETE
Bilirubin, UA: NEGATIVE
Glucose, UA: NEGATIVE
Ketones, UA: NEGATIVE
Leukocytes,UA: NEGATIVE
Nitrite, UA: NEGATIVE
Protein,UA: NEGATIVE
RBC, UA: NEGATIVE
Specific Gravity, UA: 1.01 (ref 1.005–1.030)
Urobilinogen, Ur: 0.2 mg/dL (ref 0.2–1.0)
pH, UA: 5.5 (ref 5.0–7.5)

## 2024-04-02 LAB — MICROSCOPIC EXAMINATION: Bacteria, UA: NONE SEEN

## 2024-04-02 MED ORDER — SILODOSIN 8 MG PO CAPS
8.0000 mg | ORAL_CAPSULE | Freq: Every day | ORAL | 11 refills | Status: DC
Start: 2024-04-02 — End: 2024-06-11
  Filled 2024-04-02 (×2): qty 30, 30d supply, fill #0
  Filled 2024-05-03: qty 30, 30d supply, fill #1
  Filled 2024-06-06: qty 30, 30d supply, fill #2

## 2024-04-02 MED ORDER — SILODOSIN 8 MG PO CAPS: 8.0000 mg | ORAL_CAPSULE | Freq: Every day | ORAL | 11 refills | Status: DC

## 2024-04-02 NOTE — Progress Notes (Signed)
 04/02/2024 1:58 PM   Adrian [name] 02/22/1946 [id number]  Referring provider: Marina Goodell, MD [address] [place],  [place] [address]  No chief complaint on file.    HPI: SN: Chronic prostatitis on Coumadin, sleep apnea, chronic pain, occasional urge incontinence and nocturia,  Today Patient had a slow flow much better on Flomax.  He has dry ejaculation.  After 1 month of stopping it ejaculation is not normal.  Now he has a poor flow again.  His flow was good on tamsulosin.  He voids every 2 hours depending on fluid intake and sometimes gets up once at night  If he leans forward he can dribble a small drop but otherwise is continent.  Having said that he may have rare urge incontinence.  No stress incontinence.  Does not wear a pad  He had a PSA February 01, 2023 and was 2.55  Has a lot of medical issues   PMH: Past Medical History:  Diagnosis Date   Anxiety    Arthritis    rheumatoid   Atypical mole 10/22/2015   R mid back paraspinal/mod   Basal cell carcinoma of skin 01/29/2010   L nasolabial fold   Basal cell carcinoma of skin 04/22/2015   L ear sup to tragus/excision   Basal cell carcinoma of skin 11/25/2015   R tragus/excision   BCC (basal cell carcinoma of skin) 02/23/2024   Left nasal ala - ED&C done   BCC (basal cell carcinoma of skin) 02/22/2024   left nasal ala - ED&C done   Colon polyps    Depression    GERD (gastroesophageal reflux disease)    Gout    Hemorrhoids    History of basal cell carcinoma (BCC) 05/15/2020    right lower lip/chin   Hypertension    Lumbar radiculopathy 09/25/2014   Neuropathy    Osteoarthritis    Pneumonia    Pulmonary embolism (HCC) 05/25/2014   Overview:  Times 2 on anticoagulation a.  Times two.   b.  Filter placed.    Last Assessment & Plan:  Times 2 on anticoagulation a.  Times two.   b.  Filter placed. No current bleeding noted.     Reflux    Sleep apnea    Umbilical hernia     Surgical History: Past  Surgical History:  Procedure Laterality Date   CARDIOVERSION N/A 03/18/2021   Procedure: CARDIOVERSION;  Surgeon: Lamar Blinks, MD;  Location: [facility];  Service: Cardiovascular;  Laterality: N/A;   CARDIOVERSION N/A 09/13/2023   Procedure: CARDIOVERSION;  Surgeon: Alwyn Pea, MD;  Location: [facility];  Service: Cardiovascular;  Laterality: N/A;   COLONOSCOPY     ESOPHAGOGASTRODUODENOSCOPY     HERNIA REPAIR     umbilical   IVC FILTER INSERTION  04/15/2014   Bard Denali IVC filter by Dr. Wyn Quaker   TOTAL HIP ARTHROPLASTY Right 01/02/2019   Procedure: TOTAL HIP ARTHROPLASTY ANTERIOR APPROACH;  Surgeon: Kennedy Bucker, MD;  Location: [facility];  Service: Orthopedics;  Laterality: Right;    Home Medications:  Allergies as of 04/02/2024       Reactions   Shellfish Allergy Swelling, Anaphylaxis, Other (See Comments)   "THROAT CLOSING" Uncoded Allergy. Allergen: seafood, Other Reaction: "THROAT CLOSING"     "THROAT CLOSING"   Duloxetine Other (See Comments)   Hallucination Hallucination    Hallucination Hallucination Hallucination  Hallucination   Pseudoephedrine Hcl Other (See Comments)   Other Reaction: ELEVATED BLOOD PRESSURE  ELEVATED BLOOD PRESSURE    ELEVATED BLOOD PRESSURE Other Reaction: ELEVATED BLOOD PRESSURE ELEVATED BLOOD PRESSURE ELEVATED BLOOD PRESSURE  Other Reaction: ELEVATED BLOOD PRESSURE  ELEVATED BLOOD PRESSURE   Pseudoephedrine Other (See Comments)   Pseudoephedrine Hcl Other (See Comments)   ELEVATED BLOOD PRESSURE        Medication List        Accurate as of April 02, 2024  1:58 PM. If you have any questions, ask your nurse or doctor.          acetaminophen 500 MG tablet Commonly known as: TYLENOL Take 1,000 mg by mouth daily.   Adapalene 0.3 % gel Commonly known as: Differin Apply 1 application topically at bedtime.   allopurinol 300 MG tablet Commonly known as: ZYLOPRIM Take 300 mg by mouth daily.   allopurinol 300 MG  tablet Commonly known as: ZYLOPRIM Take 1 tablet (300 mg total) by mouth daily.   amLODipine 2.5 MG tablet Commonly known as: NORVASC Take 1 tablet (2.5 mg total) by mouth daily.   benazepril 5 MG tablet Commonly known as: LOTENSIN Take 1 tablet (5 mg total) by mouth daily.   bisacodyl 5 MG EC tablet Commonly known as: DULCOLAX Take 1 tablet (5 mg total) by mouth daily as needed for moderate constipation.   busPIRone 5 MG tablet Commonly known as: BUSPAR Take 1 tablet by mouth 2 (two) times daily.   celecoxib 200 MG capsule Commonly known as: CELEBREX Take 1 capsule (200 mg total) by mouth daily.   cyanocobalamin 1000 MCG tablet Commonly known as: VITAMIN B12 Take 1,000 mcg by mouth daily.   dutasteride 0.5 MG capsule Commonly known as: AVODART Take 0.5 mg by mouth daily.   Eliquis 5 MG Tabs tablet Generic drug: apixaban Take 1 tablet (5 mg total) by mouth 2 (two) times daily.   fluocinonide gel 0.05 % Commonly known as: LIDEX Apply 1 application topically as directed. Qd prn   gabapentin 300 MG capsule Commonly known as: NEURONTIN Take 300 mg by mouth 2 (two) times daily.   gabapentin 300 MG capsule Commonly known as: NEURONTIN Take 1 capsule (300 mg total) by mouth 2 (two) times daily.   HYDROcodone-acetaminophen 10-325 MG tablet Commonly known as: NORCO Take 1 tablet by mouth 2 (two) times daily as needed for pain.   HYDROcodone-acetaminophen 10-325 MG tablet Commonly known as: NORCO Take 0.5-1 tablets by mouth 3 (three) times daily as needed.   HYDROcodone-acetaminophen 10-325 MG tablet Commonly known as: NORCO Take 0.5-1 tablets by mouth 3 (three) times daily as needed.   HYDROcodone-acetaminophen 10-325 MG tablet Commonly known as: NORCO Take 0.5-1 tablets by mouth 3 (three) times daily as needed. Start taking on: April 09, 2024   HYDROcodone-acetaminophen 10-325 MG tablet Commonly known as: NORCO Take 0.5-1 tablets by mouth 3 (three) times  daily as needed. Start taking on: May 09, 2024   metoprolol tartrate 25 MG tablet Commonly known as: LOPRESSOR Take 1 tablet (25 mg total) by mouth 2 (two) times daily.   mometasone 0.1 % cream Commonly known as: ELOCON Apply 1 Application topically as directed. Qd up to 5 days a week to aa rash on chest prn itchy flares   multivitamin with minerals Tabs tablet Take 1 tablet by mouth daily. One-A-Day   naloxone 4 MG/0.1ML Liqd nasal spray kit Commonly known as: NARCAN Place 1 spray into the nose once.   pantoprazole 40 MG tablet Commonly known as: PROTONIX Take 1 tablet (40 mg total) by mouth daily.  [facility] Colon Health Caps Take 1 capsule by mouth daily.   potassium chloride 10 MEQ tablet Commonly known as: KLOR-CON Take 1 tablet (10 mEq total) by mouth daily.   tamsulosin 0.4 MG Caps capsule Commonly known as: FLOMAX Take 1 capsule (0.4 mg total) by mouth 30 minutes after the same meal each daiy for prostate.   torsemide 10 MG tablet Commonly known as: DEMADEX Take 1 tablet (10 mg total) by mouth daily.   venlafaxine XR 75 MG 24 hr capsule Commonly known as: EFFEXOR-XR Take 75 mg by mouth 2 (two) times daily.   Vitamin D3 50 MCG (2000 UT) Tabs Take 2,000 mg by mouth daily.        Allergies:  Allergies  Allergen Reactions   Shellfish Allergy Swelling, Anaphylaxis and Other (See Comments)    "THROAT CLOSING"  Uncoded Allergy. Allergen: seafood, Other Reaction: "THROAT CLOSING"     "THROAT CLOSING"   Duloxetine Other (See Comments)    Hallucination  Hallucination    Hallucination Hallucination  Hallucination  Hallucination   Pseudoephedrine Hcl Other (See Comments)    Other Reaction: ELEVATED BLOOD PRESSURE    ELEVATED BLOOD PRESSURE    ELEVATED BLOOD PRESSURE Other Reaction: ELEVATED BLOOD PRESSURE ELEVATED BLOOD PRESSURE  ELEVATED BLOOD PRESSURE  Other Reaction: ELEVATED BLOOD PRESSURE  ELEVATED BLOOD PRESSURE   Pseudoephedrine Other  (See Comments)   Pseudoephedrine Hcl Other (See Comments)    ELEVATED BLOOD PRESSURE    Family History: Family History  Problem Relation Age of Onset   Diabetes Mother    Cancer Father     Social History:  reports that he has been smoking cigarettes. He has a 5.4 pack-year smoking history. He has never used smokeless tobacco. He reports that he does not currently use alcohol. He reports that he does not use drugs.  ROS:                                        Physical Exam: There were no vitals taken for this visit.  Constitutional:  Alert and oriented, No acute distress. HEENT: [place] AT, moist mucus membranes.  Trachea midline, no masses. Cardiovascular: No clubbing, cyanosis, or edema. Respiratory: Normal respiratory effort, no increased work of breathing. GI: Abdomen is soft, nontender, nondistended, no abdominal masses GU: Prostate difficult to feel due to body habitus Skin: No rashes, bruises or suspicious lesions. Lymph: No cervical or inguinal adenopathy. Neurologic: Grossly intact, no focal deficits, moving all 4 extremities. Psychiatric: Normal mood and affect.  Laboratory Data: Lab Results  Component Value Date   WBC 9.6 01/05/2019   HGB 9.8 (L) 01/05/2019   HCT 30.2 (L) 01/05/2019   MCV 90.7 01/05/2019   PLT 235 01/05/2019    Lab Results  Component Value Date   CREATININE 0.88 01/03/2019    No results found for: "PSA"  No results found for: "TESTOSTERONE"  No results found for: "HGBA1C"  Urinalysis    Component Value Date/Time   COLORURINE YELLOW (A) 01/01/2019 1207   APPEARANCEUR CLEAR (A) 01/01/2019 1207   APPEARANCEUR Clear 11/14/2018 0925   LABSPEC 1.005 01/01/2019 1207   PHURINE 6.0 01/01/2019 1207   GLUCOSEU NEGATIVE 01/01/2019 1207   HGBUR NEGATIVE 01/01/2019 1207   BILIRUBINUR NEGATIVE 01/01/2019 1207   BILIRUBINUR Negative 11/14/2018 0925   KETONESUR NEGATIVE 01/01/2019 1207   PROTEINUR NEGATIVE 01/01/2019 1207    NITRITE NEGATIVE 01/01/2019 1207  LEUKOCYTESUR NEGATIVE 01/01/2019 1207   LEUKOCYTESUR Negative 11/14/2018 0925    Pertinent Imaging: Urine negative.  Assessment & Plan: We talked about side effects of alpha blockers.  He like to try silodosin 10 mg once a day 30 x 11 and reassess in 6 weeks.  He was definitely much better on an alpha-blocker and it would be nice to manage him nonsurgically  The patient does have a symptom that he says when he bends forward he can leak a little bit.  I will see if this improves on the [place] and he said he still had it on the Flomax.  I will check a postvoid residual next visit.  I could always perform cystoscopy depending on how much it is affecting his quality of life.  I do not think he is triggering an overactive bladder contraction but will ask him more about his urgency next time  1. Stress incontinence, male (Primary)   2. Stress incontinence, male  - Urinalysis, Complete   No follow-ups on file.  Devorah Fonder, MD  [facility] [address], [address] [place], [place] [address] [phone]

## 2024-04-06 LAB — CULTURE, URINE COMPREHENSIVE

## 2024-04-09 ENCOUNTER — Other Ambulatory Visit (HOSPITAL_COMMUNITY): Payer: Self-pay

## 2024-04-09 DIAGNOSIS — M5126 Other intervertebral disc displacement, lumbar region: Secondary | ICD-10-CM | POA: Diagnosis not present

## 2024-04-09 DIAGNOSIS — M5416 Radiculopathy, lumbar region: Secondary | ICD-10-CM | POA: Diagnosis not present

## 2024-04-09 DIAGNOSIS — Z79899 Other long term (current) drug therapy: Secondary | ICD-10-CM | POA: Diagnosis not present

## 2024-04-09 DIAGNOSIS — M48062 Spinal stenosis, lumbar region with neurogenic claudication: Secondary | ICD-10-CM | POA: Diagnosis not present

## 2024-04-09 DIAGNOSIS — M1712 Unilateral primary osteoarthritis, left knee: Secondary | ICD-10-CM | POA: Diagnosis not present

## 2024-04-09 DIAGNOSIS — M62830 Muscle spasm of back: Secondary | ICD-10-CM | POA: Diagnosis not present

## 2024-04-09 MED ORDER — HYDROCODONE-ACETAMINOPHEN 10-325 MG PO TABS
0.5000 | ORAL_TABLET | Freq: Three times a day (TID) | ORAL | 0 refills | Status: AC | PRN
Start: 2024-04-09 — End: ?
  Filled 2024-04-09: qty 75, 25d supply, fill #0

## 2024-04-09 MED ORDER — HYDROCODONE-ACETAMINOPHEN 10-325 MG PO TABS
0.5000 | ORAL_TABLET | Freq: Three times a day (TID) | ORAL | 0 refills | Status: DC | PRN
Start: 2024-05-09 — End: 2024-05-02

## 2024-04-09 MED ORDER — HYDROCODONE-ACETAMINOPHEN 10-325 MG PO TABS
0.5000 | ORAL_TABLET | Freq: Three times a day (TID) | ORAL | 0 refills | Status: DC | PRN
Start: 2024-06-08 — End: 2024-05-02

## 2024-04-10 ENCOUNTER — Other Ambulatory Visit (HOSPITAL_COMMUNITY): Payer: Self-pay

## 2024-04-13 DIAGNOSIS — F3341 Major depressive disorder, recurrent, in partial remission: Secondary | ICD-10-CM | POA: Diagnosis not present

## 2024-04-13 DIAGNOSIS — M129 Arthropathy, unspecified: Secondary | ICD-10-CM | POA: Diagnosis not present

## 2024-04-13 DIAGNOSIS — I4891 Unspecified atrial fibrillation: Secondary | ICD-10-CM | POA: Diagnosis not present

## 2024-04-13 DIAGNOSIS — I10 Essential (primary) hypertension: Secondary | ICD-10-CM | POA: Diagnosis not present

## 2024-04-13 DIAGNOSIS — E1140 Type 2 diabetes mellitus with diabetic neuropathy, unspecified: Secondary | ICD-10-CM | POA: Diagnosis not present

## 2024-04-13 DIAGNOSIS — J449 Chronic obstructive pulmonary disease, unspecified: Secondary | ICD-10-CM | POA: Diagnosis not present

## 2024-04-18 DIAGNOSIS — E1140 Type 2 diabetes mellitus with diabetic neuropathy, unspecified: Secondary | ICD-10-CM | POA: Diagnosis not present

## 2024-04-18 DIAGNOSIS — Z125 Encounter for screening for malignant neoplasm of prostate: Secondary | ICD-10-CM | POA: Diagnosis not present

## 2024-04-18 DIAGNOSIS — R61 Generalized hyperhidrosis: Secondary | ICD-10-CM | POA: Diagnosis not present

## 2024-04-20 DIAGNOSIS — R61 Generalized hyperhidrosis: Secondary | ICD-10-CM | POA: Diagnosis not present

## 2024-04-20 DIAGNOSIS — Z125 Encounter for screening for malignant neoplasm of prostate: Secondary | ICD-10-CM | POA: Diagnosis not present

## 2024-04-25 ENCOUNTER — Other Ambulatory Visit (HOSPITAL_COMMUNITY): Payer: Self-pay

## 2024-04-25 ENCOUNTER — Other Ambulatory Visit: Payer: Self-pay

## 2024-05-02 ENCOUNTER — Other Ambulatory Visit: Payer: Self-pay

## 2024-05-02 ENCOUNTER — Other Ambulatory Visit (HOSPITAL_COMMUNITY): Payer: Self-pay

## 2024-05-02 DIAGNOSIS — M5416 Radiculopathy, lumbar region: Secondary | ICD-10-CM | POA: Diagnosis not present

## 2024-05-02 DIAGNOSIS — Z1330 Encounter for screening examination for mental health and behavioral disorders, unspecified: Secondary | ICD-10-CM | POA: Diagnosis not present

## 2024-05-02 DIAGNOSIS — G894 Chronic pain syndrome: Secondary | ICD-10-CM | POA: Diagnosis not present

## 2024-05-02 MED ORDER — CELECOXIB 200 MG PO CAPS
200.0000 mg | ORAL_CAPSULE | Freq: Every day | ORAL | 3 refills | Status: DC
  Filled 2024-05-02: qty 90, 90d supply, fill #0

## 2024-05-03 ENCOUNTER — Other Ambulatory Visit: Payer: Self-pay

## 2024-05-03 ENCOUNTER — Other Ambulatory Visit (HOSPITAL_COMMUNITY): Payer: Self-pay

## 2024-05-10 ENCOUNTER — Telehealth: Payer: Self-pay

## 2024-05-10 NOTE — Telephone Encounter (Signed)
 Incoming call from pt on triage line who states that he has been taking Silodosin  for a month but has not seen any improvement in his symptoms. He questions if there are other medications he can try at this time. Please advise.

## 2024-05-16 DIAGNOSIS — I4891 Unspecified atrial fibrillation: Secondary | ICD-10-CM | POA: Diagnosis not present

## 2024-05-16 DIAGNOSIS — E7800 Pure hypercholesterolemia, unspecified: Secondary | ICD-10-CM | POA: Diagnosis not present

## 2024-05-16 DIAGNOSIS — M129 Arthropathy, unspecified: Secondary | ICD-10-CM | POA: Diagnosis not present

## 2024-05-16 DIAGNOSIS — J449 Chronic obstructive pulmonary disease, unspecified: Secondary | ICD-10-CM | POA: Diagnosis not present

## 2024-05-16 DIAGNOSIS — F3341 Major depressive disorder, recurrent, in partial remission: Secondary | ICD-10-CM | POA: Diagnosis not present

## 2024-05-16 DIAGNOSIS — E1140 Type 2 diabetes mellitus with diabetic neuropathy, unspecified: Secondary | ICD-10-CM | POA: Diagnosis not present

## 2024-05-16 DIAGNOSIS — I10 Essential (primary) hypertension: Secondary | ICD-10-CM | POA: Diagnosis not present

## 2024-05-16 NOTE — Telephone Encounter (Signed)
 See my chart message

## 2024-05-18 ENCOUNTER — Telehealth: Payer: Self-pay | Admitting: Cardiovascular Disease

## 2024-05-18 DIAGNOSIS — G894 Chronic pain syndrome: Secondary | ICD-10-CM | POA: Diagnosis not present

## 2024-05-18 DIAGNOSIS — M5416 Radiculopathy, lumbar region: Secondary | ICD-10-CM | POA: Diagnosis not present

## 2024-05-18 NOTE — Telephone Encounter (Signed)
 Pt called requesting an ECHO.  States that he has left sided chest pain since he had a cardioversion "a while back" and had spoken with [name] about it at his last appointment.  States that the pain has increased recently.  I offered pt an appointment with one of our APPs, but pt states that he wanted to see [name].  An appointment was scheduled for 06/04/24 @ 3:20 pm.  I informed the patient that if he developed more severe chest pain prior to his appointment for him to go to Emergency Department.  Pt verbalized understanding.

## 2024-05-18 NOTE — Telephone Encounter (Signed)
 Pt is requesting a callback regarding him wanting to have an order put in for him to have an ECHO done. Please advise.

## 2024-05-23 DIAGNOSIS — R3912 Poor urinary stream: Secondary | ICD-10-CM | POA: Diagnosis not present

## 2024-05-23 DIAGNOSIS — F17210 Nicotine dependence, cigarettes, uncomplicated: Secondary | ICD-10-CM | POA: Diagnosis not present

## 2024-05-23 DIAGNOSIS — R262 Difficulty in walking, not elsewhere classified: Secondary | ICD-10-CM | POA: Diagnosis not present

## 2024-05-23 DIAGNOSIS — M5431 Sciatica, right side: Secondary | ICD-10-CM | POA: Diagnosis not present

## 2024-05-23 DIAGNOSIS — M5432 Sciatica, left side: Secondary | ICD-10-CM | POA: Diagnosis not present

## 2024-05-23 DIAGNOSIS — M5416 Radiculopathy, lumbar region: Secondary | ICD-10-CM | POA: Diagnosis not present

## 2024-05-23 DIAGNOSIS — M1991 Primary osteoarthritis, unspecified site: Secondary | ICD-10-CM | POA: Diagnosis not present

## 2024-05-23 DIAGNOSIS — Z79899 Other long term (current) drug therapy: Secondary | ICD-10-CM | POA: Diagnosis not present

## 2024-05-23 DIAGNOSIS — G629 Polyneuropathy, unspecified: Secondary | ICD-10-CM | POA: Diagnosis not present

## 2024-05-23 DIAGNOSIS — Z885 Allergy status to narcotic agent status: Secondary | ICD-10-CM | POA: Diagnosis not present

## 2024-05-23 DIAGNOSIS — E1140 Type 2 diabetes mellitus with diabetic neuropathy, unspecified: Secondary | ICD-10-CM | POA: Diagnosis not present

## 2024-06-03 NOTE — Progress Notes (Unsigned)
 Cardiology Office Note  Date:  06/04/2024   ID:  Adrian [name], DOB March 30, 1946, MRN [id number]  PCP:  Lorrie Rothman, MD   Chief Complaint  Patient presents with   Follow-up    1 month follow up pat has been doing well with no complaints of chest pain, chest pressure or SOB, medciation reviewed verbally with patient   HPI:  [name] is a 78 year old gentleman with past medical history of Hypertension COPD Smoking/COPD Sleep apnea DVT/PE Paroxysmal atrial fibrillation, cardioversion 3/22 and September 13, 2023 Who presents for follow-up of his persistent atrial fibrillation  LOV 1/25 Prior arrhythmia history reviewed Atrial fibrillation first noted June 2024 on EKG Atrial fibrillation on EKG August 2024 Normal sinus rhythm September 13, 2023 Cardioversion September 13, 2023 Normal sinus rhythm January 03, 2024 Normal sinus rhythm June 04, 2024  In general reports doing well Having some atypical left pectoral pain, typically presenting at rest, feels like a cramp None in the past month  Drinking lots of pepsi, poor diet  A1C 6.5  On warfarin and metoprolol  tartrate 25 twice daily  Reports blood pressure has been running high Also on amlodipine  2.5, benazepril  5 daily  Echocardiogram September 2024 EF greater than 55%, normal RV function Mildly dilated left atrium Noted to be in atrial fibrillation during the exam  Lab work reviewed Total chol 176, LDL 77  EKG personally reviewed by myself on todays visit EKG Interpretation Date/Time:  Monday June 04 2024 15:10:42 EDT Ventricular Rate:  64 PR Interval:    QRS Duration:  80 QT Interval:  400 QTC Calculation: 412 R Axis:   -28  Text Interpretation: Normal sinus rhythm When compared with ECG of 03-Jan-2024 11:15, No significant change was found Confirmed by Belva Boyden [phone]) on 06/04/2024 3:26:22 PM    PMH:   has a past medical history of Anxiety, Arthritis, Atypical mole (10/22/2015), Basal cell  carcinoma of skin (01/29/2010), Basal cell carcinoma of skin (04/22/2015), Basal cell carcinoma of skin (11/25/2015), BCC (basal cell carcinoma of skin) (02/23/2024), BCC (basal cell carcinoma of skin) (02/22/2024), Colon polyps, Depression, GERD (gastroesophageal reflux disease), Gout, Hemorrhoids, History of basal cell carcinoma (BCC) (05/15/2020), Hypertension, Lumbar radiculopathy (09/25/2014), Neuropathy, Osteoarthritis, Pneumonia, Pulmonary embolism (HCC) (05/25/2014), Reflux, Sleep apnea, and Umbilical hernia.  PSH:    Past Surgical History:  Procedure Laterality Date   CARDIOVERSION N/A 03/18/2021   Procedure: CARDIOVERSION;  Surgeon: Michelle Aid, MD;  Location: [facility] OR[facility];  Service: Cardiovascular;  Laterality: N/A;   CARDIOVERSION N/A 09/13/2023   Procedure: CARDIOVERSION;  Surgeon: Antonette Batters, MD;  Location: [facility];  Service: Cardiovascular;  Laterality: N/A;   COLONOSCOPY     ESOPHAGOGASTRODUODENOSCOPY     HERNIA REPAIR     umbilical   IVC FILTER INSERTION  04/15/2014   Bard Denali IVC filter by Dr. Vonna Guardian   TOTAL HIP ARTHROPLASTY Right 01/02/2019   Procedure: TOTAL HIP ARTHROPLASTY ANTERIOR APPROACH;  Surgeon: Molli Angelucci, MD;  Location: [facility] ORS;  Service: Orthopedics;  Laterality: Right;    Current Outpatient Medications  Medication Sig Dispense Refill   acetaminophen  (TYLENOL ) 500 MG tablet Take 1,000 mg by mouth daily.     allopurinol  (ZYLOPRIM ) 300 MG tablet Take 1 tablet (300 mg total) by mouth daily. 90 tablet 3   amLODipine  (NORVASC ) 2.5 MG tablet Take 1 tablet (2.5 mg total) by mouth daily. 90 tablet 3   apixaban  (ELIQUIS ) 5 MG TABS tablet Take 1 tablet (5 mg total) by mouth 2 (  two) times daily. 180 tablet 3   benazepril  (LOTENSIN ) 5 MG tablet Take 1 tablet (5 mg total) by mouth daily. 90 tablet 3   bisacodyl  (DULCOLAX) 5 MG EC tablet Take 1 tablet (5 mg total) by mouth daily as needed for moderate constipation. 30 tablet 0   busPIRone (BUSPAR) 5 MG tablet  Take 1 tablet by mouth 2 (two) times daily.     carbamazepine (TEGRETOL) 100 MG chewable tablet Chew 100 mg by mouth at bedtime.     Cholecalciferol (VITAMIN D3) 50 MCG (2000 UT) TABS Take 2,000 mg by mouth daily.     gabapentin  (NEURONTIN ) 300 MG capsule Take 1 capsule (300 mg total) by mouth 2 (two) times daily. 180 capsule 3   HYDROcodone -acetaminophen  (NORCO) 10-325 MG tablet Take 0.5-1 tablets by mouth 3 (three) times daily as needed. 75 tablet 0   metoprolol  tartrate (LOPRESSOR ) 25 MG tablet Take 1 tablet (25 mg total) by mouth 2 (two) times daily. 180 tablet 3   mometasone  (ELOCON ) 0.1 % cream Apply 1 Application topically as directed. Qd up to 5 days a week to aa rash on chest prn itchy flares 45 g 1   Multiple Vitamin (MULTIVITAMIN WITH MINERALS) TABS tablet Take 1 tablet by mouth daily. One-A-Day     naloxone (NARCAN) nasal spray 4 mg/0.1 mL Place 1 spray into the nose once.     pantoprazole  (PROTONIX ) 40 MG tablet Take 1 tablet (40 mg total) by mouth daily. 90 tablet 3   potassium chloride  (KLOR-CON ) 10 MEQ tablet Take 1 tablet (10 mEq total) by mouth daily. 90 tablet 3   Probiotic Product (PHILLIPS COLON HEALTH) CAPS Take 1 capsule by mouth daily.     silodosin  (RAPAFLO ) 8 MG CAPS capsule Take 1 capsule (8 mg total) by mouth daily with breakfast. 30 capsule 11   torsemide  (DEMADEX ) 10 MG tablet Take 1 tablet (10 mg total) by mouth daily. 90 tablet 3   venlafaxine  XR (EFFEXOR -XR) 75 MG 24 hr capsule Take 75 mg by mouth 2 (two) times daily.      vitamin B-12 (CYANOCOBALAMIN ) 1000 MCG tablet Take 1,000 mcg by mouth daily.     dutasteride (AVODART) 0.5 MG capsule Take 0.5 mg by mouth daily. (Patient not taking: Reported on 06/04/2024)     No current facility-administered medications for this visit.     Allergies:   Amiodarone, Shellfish allergy, Duloxetine, Pseudoephedrine hcl, Pseudoephedrine, Pseudoephedrine hcl, and Doxycycline    Social History:  The patient  reports that he has  been smoking cigarettes. He has a 5.4 pack-year smoking history. He has never used smokeless tobacco. He reports that he does not currently use alcohol. He reports that he does not use drugs.   Family History:   family history includes Cancer in his father; Diabetes in his mother.    Review of Systems: Review of Systems  Constitutional: Negative.   HENT: Negative.    Respiratory: Negative.    Cardiovascular: Negative.   Gastrointestinal: Negative.   Musculoskeletal: Negative.   Neurological: Negative.   Psychiatric/Behavioral: Negative.    All other systems reviewed and are negative.   PHYSICAL EXAM: VS:  BP (!) 150/72 (BP Location: Left Arm, Patient Position: Sitting, Cuff Size: Large)   Pulse 64   Ht 5' 11 (1.803 m)   Wt (!) 304 lb 6.4 oz (138.1 kg)   SpO2 98%   BMI 42.46 kg/m  , BMI Body mass index is 42.46 kg/m. Constitutional:  oriented to person, place, and time.  No distress.  HENT:  Head: Grossly normal Eyes:  no discharge. No scleral icterus.  Neck: No JVD, no carotid bruits  Cardiovascular: Regular rate and rhythm, no murmurs appreciated Pulmonary/Chest: Clear to auscultation bilaterally, no wheezes or rails Abdominal: Soft.  no distension.  no tenderness.  Musculoskeletal: Normal range of motion Neurological:  normal muscle tone. Coordination normal. No atrophy Skin: Skin warm and dry Psychiatric: normal affect, pleasant   Recent Labs: No results found for requested labs within last 365 days.   Lipid Panel Lab Results  Component Value Date   CHOL 200 08/10/2012   HDL 39 (L) 08/10/2012   LDLCALC 142 (H) 08/10/2012   TRIG 94 08/10/2012     Wt Readings from Last 3 Encounters:  06/04/24 (!) 304 lb 6.4 oz (138.1 kg)  01/03/24 (!) 307 lb 2 oz (139.3 kg)  09/13/23 300 lb (136.1 kg)     ASSESSMENT AND PLAN:  Problem List Items Addressed This Visit       Cardiology Problems   Persistent atrial fibrillation (HCC)   Relevant Orders   EKG 12-Lead  (Completed)   HLD (hyperlipidemia)   Pulmonary embolism (HCC)   Benign hypertension     Other   Morbid (severe) obesity due to excess calories (HCC)   Mixed simple and mucopurulent chronic bronchitis (HCC)   Obstructive apnea   Persistent atrial fibrillation Risk factors include morbid obesity, sleep apnea, age High risk of recurrent arrhythmia -Currently maintaining normal sinus rhythm following cardioversion September 2024 -Recommend he continue metoprolol  tartrate 25 twice daily, Eliquis  5 twice daily  History of DVT/PE On Eliquis  as above  Essential hypertension Reports blood pressure running high Recommend to increase benazepril  up to 5 twice daily If pressure continues to run high could increase amlodipine  up to 5 daily Continue metoprolol  tartrate 25 twice daily  Morbid obesity Reports daily use of Pepsi Recommend he switch to a diet drink Cut back on his bread, carbohydrates A1c running 6.5, has neuropathy  Former smoker/COPD Reports remote history of smoking Weight loss and walking program recommended   Signed, Juanda Noon, M.D., Ph.D. [facility] [place], [place] [phone]

## 2024-06-04 ENCOUNTER — Encounter: Payer: Self-pay | Admitting: Cardiovascular Disease

## 2024-06-04 ENCOUNTER — Other Ambulatory Visit (HOSPITAL_COMMUNITY): Payer: Self-pay

## 2024-06-04 ENCOUNTER — Ambulatory Visit: Attending: Cardiovascular Disease | Admitting: Cardiovascular Disease

## 2024-06-04 DIAGNOSIS — I4819 Other persistent atrial fibrillation: Secondary | ICD-10-CM | POA: Diagnosis not present

## 2024-06-04 DIAGNOSIS — I10 Essential (primary) hypertension: Secondary | ICD-10-CM

## 2024-06-04 DIAGNOSIS — I2782 Chronic pulmonary embolism: Secondary | ICD-10-CM

## 2024-06-04 DIAGNOSIS — G4733 Obstructive sleep apnea (adult) (pediatric): Secondary | ICD-10-CM

## 2024-06-04 DIAGNOSIS — E782 Mixed hyperlipidemia: Secondary | ICD-10-CM

## 2024-06-04 DIAGNOSIS — J418 Mixed simple and mucopurulent chronic bronchitis: Secondary | ICD-10-CM

## 2024-06-04 MED ORDER — BENAZEPRIL HCL 5 MG PO TABS
5.0000 mg | ORAL_TABLET | Freq: Two times a day (BID) | ORAL | 3 refills | Status: DC
  Filled 2024-06-04 – 2024-08-15 (×4): qty 180, 90d supply, fill #0

## 2024-06-04 NOTE — Patient Instructions (Addendum)
 Medication Instructions:   PLEASE INCREASE THE BENAZEPRIL  UP TO 5 MG TWICE A DAY  MONITOR PRESSURE AT HOME  If you need a refill on your cardiac medications before your next appointment, please call your pharmacy.   Lab work: No new labs needed  Testing/Procedures: No new testing needed  Follow-Up: At [facility], you and your health needs are our priority.  As part of our continuing mission to provide you with exceptional heart care, we have created designated Provider Care Teams.  These Care Teams include your primary Cardiologist (physician) and Advanced Practice Providers (APPs -  Physician Assistants and Nurse Practitioners) who all work together to provide you with the care you need, when you need it.  You will need a follow up appointment in 12 months  Providers on your designated Care Team:   Laneta Pintos, NP Varney Gentleman, PA-C Cadence Gennaro Khat, [place]  COVID-19 Vaccine Information can be found at: [email or web address] For questions related to vaccine distribution or appointments, please email [email or web address] or call [phone].

## 2024-06-05 ENCOUNTER — Other Ambulatory Visit (HOSPITAL_COMMUNITY): Payer: Self-pay

## 2024-06-06 ENCOUNTER — Other Ambulatory Visit (HOSPITAL_COMMUNITY): Payer: Self-pay

## 2024-06-06 ENCOUNTER — Other Ambulatory Visit: Payer: Self-pay

## 2024-06-11 ENCOUNTER — Other Ambulatory Visit (HOSPITAL_COMMUNITY): Payer: Self-pay

## 2024-06-11 ENCOUNTER — Ambulatory Visit: Admitting: Urology

## 2024-06-11 VITALS — BP 155/69 | HR 69 | Ht 71.0 in | Wt 304.0 lb

## 2024-06-11 DIAGNOSIS — R3912 Poor urinary stream: Secondary | ICD-10-CM

## 2024-06-11 DIAGNOSIS — N393 Stress incontinence (female) (male): Secondary | ICD-10-CM

## 2024-06-11 LAB — URINALYSIS, COMPLETE
Bilirubin, UA: NEGATIVE
Glucose, UA: NEGATIVE
Ketones, UA: NEGATIVE
Leukocytes,UA: NEGATIVE
Nitrite, UA: NEGATIVE
Protein,UA: NEGATIVE
RBC, UA: NEGATIVE
Specific Gravity, UA: 1.01 (ref 1.005–1.030)
Urobilinogen, Ur: 0.2 mg/dL (ref 0.2–1.0)
pH, UA: 6 (ref 5.0–7.5)

## 2024-06-11 LAB — BLADDER SCAN AMB NON-IMAGING: Scan Result: 25

## 2024-06-11 LAB — MICROSCOPIC EXAMINATION

## 2024-06-11 MED ORDER — SILODOSIN 8 MG PO CAPS
8.0000 mg | ORAL_CAPSULE | Freq: Every day | ORAL | 11 refills | Status: DC
  Filled 2024-06-11: qty 30, 30d supply, fill #0
  Filled 2024-07-31 (×2): qty 30, 30d supply, fill #1
  Filled 2024-08-31: qty 30, 30d supply, fill #2
  Filled 2024-09-27: qty 30, 30d supply, fill #3
  Filled 2024-10-26: qty 30, 30d supply, fill #4
  Filled 2024-11-29 (×2): qty 30, 30d supply, fill #5
  Filled 2024-12-31: qty 30, 30d supply, fill #6

## 2024-06-11 NOTE — Progress Notes (Signed)
 06/11/2024 1:51 PM   Adrian [name] [birth year] [id number]  Referring provider: Jeffie Cheryl BRAVO, MD [address] [place],  [place] [address]  Chief Complaint  Patient presents with   Follow-up    HPI: SN: Chronic prostatitis on Coumadin , sleep apnea, chronic pain, occasional urge incontinence and nocturia,   Today Patient had a slow flow much better on Flomax .  He has dry ejaculation.  After 1 month of stopping it ejaculation is now normal.  Now he has a poor flow again.  His flow was good on tamsulosin .   He voids every 2 hours depending on fluid intake and sometimes gets up once at night   If he leans forward he can dribble a small drop but otherwise is continent.  Having said that he may have rare urge incontinence.  No stress incontinence.  Does not wear a pad   He had a PSA February 01, 2023 and was 2.55   Has a lot of medical issues    Hard to feel prostate due to body habitus  We talked about side effects of alpha blockers.  He like to try silodosin  10 mg once a day 30 x 11 and reassess in 6 weeks.  He was definitely much better on an alpha-blocker and it would be nice to manage him nonsurgically   The patient does have a symptom that he says when he bends forward he can leak a little bit.  I will see if this improves on the Rapaflo  and he said he still had it on the Flomax .  I will check a postvoid residual next visit.  I could always perform cystoscopy depending on how much it is affecting his quality of life.  I do not think he is triggering an overactive bladder contraction but will ask him more about his urgency next time  Today Frequency stable.  Last PSA was 1.22 Apr 20, 2024.  Urine culture negative Flow improved on the Rapaflo  with no ejaculatory issues.  Reduce [employer]'s and his dribbling or leakage has almost completely gone away.  He is very pleased No blood in urine Postvoid residual 25 mL         PMH: Past Medical History:  Diagnosis Date    Anxiety    Arthritis    rheumatoid   Atypical mole 10/22/2015   R mid back paraspinal/mod   Basal cell carcinoma of skin 01/29/2010   L nasolabial fold   Basal cell carcinoma of skin 04/22/2015   L ear sup to tragus/excision   Basal cell carcinoma of skin 11/25/2015   R tragus/excision   BCC (basal cell carcinoma of skin) 02/23/2024   Left nasal ala - ED&C done   BCC (basal cell carcinoma of skin) 02/22/2024   left nasal ala - ED&C done   Colon polyps    Depression    GERD (gastroesophageal reflux disease)    Gout    Hemorrhoids    History of basal cell carcinoma (BCC) 05/15/2020    right lower lip/chin   Hypertension    Lumbar radiculopathy 09/25/2014   Neuropathy    Osteoarthritis    Pneumonia    Pulmonary embolism (HCC) 05/25/2014   Overview:  Times 2 on anticoagulation a.  Times two.   b.  Filter placed.    Last Assessment & Plan:  Times 2 on anticoagulation a.  Times two.   b.  Filter placed. No current bleeding noted.     Reflux    Sleep apnea  Umbilical hernia     Surgical History: Past Surgical History:  Procedure Laterality Date   CARDIOVERSION N/A 03/18/2021   Procedure: CARDIOVERSION;  Surgeon: Hester Wolm PARAS, MD;  Location: [facility];  Service: Cardiovascular;  Laterality: N/A;   CARDIOVERSION N/A 09/13/2023   Procedure: CARDIOVERSION;  Surgeon: Florencio Cara BIRCH, MD;  Location: [facility];  Service: Cardiovascular;  Laterality: N/A;   COLONOSCOPY     ESOPHAGOGASTRODUODENOSCOPY     HERNIA REPAIR     umbilical   IVC FILTER INSERTION  04/15/2014   Bard Denali IVC filter by Dr. Marea   TOTAL HIP ARTHROPLASTY Right 01/02/2019   Procedure: TOTAL HIP ARTHROPLASTY ANTERIOR APPROACH;  Surgeon: Kathlynn Sharper, MD;  Location: [facility];  Service: Orthopedics;  Laterality: Right;    Home Medications:  Allergies as of 06/11/2024       Reactions   Amiodarone Shortness Of Breath   Oxycodone  Itching   Shellfish Allergy Swelling, Anaphylaxis, Other (See Comments)    THROAT CLOSING Uncoded Allergy. Allergen: seafood, Other Reaction: THROAT CLOSING     THROAT CLOSING   Duloxetine Other (See Comments)   Hallucination Hallucination    Hallucination Hallucination Hallucination  Hallucination   Pseudoephedrine Hcl Other (See Comments)   Other Reaction: ELEVATED BLOOD PRESSURE    ELEVATED BLOOD PRESSURE    ELEVATED BLOOD PRESSURE Other Reaction: ELEVATED BLOOD PRESSURE ELEVATED BLOOD PRESSURE ELEVATED BLOOD PRESSURE  Other Reaction: ELEVATED BLOOD PRESSURE  ELEVATED BLOOD PRESSURE   Pseudoephedrine Other (See Comments)   Pseudoephedrine Hcl Other (See Comments)   ELEVATED BLOOD PRESSURE   Doxycycline  Rash        Medication List        Accurate as of June 11, 2024  1:51 PM. If you have any questions, ask your nurse or doctor.          STOP taking these medications    dutasteride 0.5 MG capsule Commonly known as: AVODART       TAKE these medications    acetaminophen  500 MG tablet Commonly known as: TYLENOL  Take 1,000 mg by mouth daily.   allopurinol  300 MG tablet Commonly known as: ZYLOPRIM  Take 1 tablet (300 mg total) by mouth daily.   amLODipine  2.5 MG tablet Commonly known as: NORVASC  Take 1 tablet (2.5 mg total) by mouth daily.   benazepril  5 MG tablet Commonly known as: LOTENSIN  Take 1 tablet (5 mg total) by mouth 2 (two) times daily.   bisacodyl  5 MG EC tablet Commonly known as: DULCOLAX Take 1 tablet (5 mg total) by mouth daily as needed for moderate constipation.   busPIRone 5 MG tablet Commonly known as: BUSPAR Take 1 tablet by mouth 2 (two) times daily.   carbamazepine 100 MG chewable tablet Commonly known as: TEGRETOL Chew 100 mg by mouth at bedtime.   celecoxib  200 MG capsule Commonly known as: CELEBREX  Take 200 mg by mouth.   cyanocobalamin  1000 MCG tablet Commonly known as: VITAMIN B12 Take 1,000 mcg by mouth daily.   Eliquis  5 MG Tabs tablet Generic drug: apixaban  Take 1 tablet (5  mg total) by mouth 2 (two) times daily.   gabapentin  300 MG capsule Commonly known as: NEURONTIN  Take 1 capsule (300 mg total) by mouth 2 (two) times daily.   HYDROcodone -acetaminophen  10-325 MG tablet Commonly known as: NORCO Take 0.5-1 tablets by mouth 3 (three) times daily as needed.   metoprolol  tartrate 25 MG tablet Commonly known as: LOPRESSOR  Take 1 tablet (25 mg total) by mouth 2 (two) times daily.  mometasone  0.1 % cream Commonly known as: ELOCON  Apply 1 Application topically as directed. Qd up to 5 days a week to aa rash on chest prn itchy flares   multivitamin with minerals Tabs tablet Take 1 tablet by mouth daily. One-A-Day   naloxone 4 MG/0.1ML Liqd nasal spray kit Commonly known as: NARCAN Place 1 spray into the nose once.   oxyCODONE -acetaminophen  10-325 MG tablet Commonly known as: PERCOCET Take 1 tablet by mouth.   pantoprazole  40 MG tablet Commonly known as: PROTONIX  Take 1 tablet (40 mg total) by mouth daily.   [facility] Caps Take 1 capsule by mouth daily.   potassium chloride  10 MEQ tablet Commonly known as: KLOR-CON  Take 1 tablet (10 mEq total) by mouth daily.   silodosin  8 MG Caps capsule Commonly known as: RAPAFLO  Take 1 capsule (8 mg total) by mouth daily with breakfast.   torsemide  10 MG tablet Commonly known as: DEMADEX  Take 1 tablet (10 mg total) by mouth daily.   venlafaxine  XR 75 MG 24 hr capsule Commonly known as: EFFEXOR -XR Take 75 mg by mouth 2 (two) times daily.   Vitamin D3 50 MCG (2000 UT) Tabs Take 2,000 mg by mouth daily.        Allergies:  Allergies  Allergen Reactions   Amiodarone Shortness Of Breath   Oxycodone  Itching   Shellfish Allergy Swelling, Anaphylaxis and Other (See Comments)    THROAT CLOSING  Uncoded Allergy. Allergen: seafood, Other Reaction: THROAT CLOSING     THROAT CLOSING   Duloxetine Other (See Comments)    Hallucination  Hallucination    Hallucination  Hallucination  Hallucination  Hallucination   Pseudoephedrine Hcl Other (See Comments)    Other Reaction: ELEVATED BLOOD PRESSURE    ELEVATED BLOOD PRESSURE    ELEVATED BLOOD PRESSURE Other Reaction: ELEVATED BLOOD PRESSURE ELEVATED BLOOD PRESSURE  ELEVATED BLOOD PRESSURE  Other Reaction: ELEVATED BLOOD PRESSURE  ELEVATED BLOOD PRESSURE   Pseudoephedrine Other (See Comments)   Pseudoephedrine Hcl Other (See Comments)    ELEVATED BLOOD PRESSURE   Doxycycline  Rash    Family History: Family History  Problem Relation Age of Onset   Diabetes Mother    Cancer Father     Social History:  reports that he has been smoking cigarettes. He has a 5.4 pack-year smoking history. He has never used smokeless tobacco. He reports that he does not currently use alcohol. He reports that he does not use drugs.  ROS:                                          Laboratory Data: Lab Results  Component Value Date   WBC 9.6 01/05/2019   HGB 9.8 (L) 01/05/2019   HCT 30.2 (L) 01/05/2019   MCV 90.7 01/05/2019   PLT 235 01/05/2019    Lab Results  Component Value Date   CREATININE 0.88 01/03/2019    No results found for: PSA  No results found for: TESTOSTERONE  No results found for: HGBA1C  Urinalysis    Component Value Date/Time   COLORURINE YELLOW (A) 01/01/2019 1207   APPEARANCEUR Clear 04/02/2024 1355   LABSPEC 1.005 01/01/2019 1207   PHURINE 6.0 01/01/2019 1207   GLUCOSEU Negative 04/02/2024 1355   HGBUR NEGATIVE 01/01/2019 1207   BILIRUBINUR Negative 04/02/2024 1355   KETONESUR NEGATIVE 01/01/2019 1207   PROTEINUR Negative 04/02/2024 1355   PROTEINUR NEGATIVE 01/01/2019  1207   NITRITE Negative 04/02/2024 1355   NITRITE NEGATIVE 01/01/2019 1207   LEUKOCYTESUR Negative 04/02/2024 1355    Pertinent Imaging:   Assessment & Plan: Prescription renewed 30 x 11 and I will see in 1 year   1. Weak urinary stream (Primary)  - Urinalysis,  Complete - BLADDER SCAN AMB NON-IMAGING  2. Stress incontinence, male  - Urinalysis, Complete - BLADDER SCAN AMB NON-IMAGING   No follow-ups on file.  Glendia DELENA Elizabeth, MD  [facility] [address] [place], [place] [address] [phone]

## 2024-06-12 ENCOUNTER — Other Ambulatory Visit (HOSPITAL_COMMUNITY): Payer: Self-pay

## 2024-07-02 DIAGNOSIS — F3341 Major depressive disorder, recurrent, in partial remission: Secondary | ICD-10-CM | POA: Diagnosis not present

## 2024-07-02 DIAGNOSIS — F419 Anxiety disorder, unspecified: Secondary | ICD-10-CM | POA: Diagnosis not present

## 2024-07-02 DIAGNOSIS — E1140 Type 2 diabetes mellitus with diabetic neuropathy, unspecified: Secondary | ICD-10-CM | POA: Diagnosis not present

## 2024-07-02 DIAGNOSIS — I10 Essential (primary) hypertension: Secondary | ICD-10-CM | POA: Diagnosis not present

## 2024-07-02 DIAGNOSIS — M129 Arthropathy, unspecified: Secondary | ICD-10-CM | POA: Diagnosis not present

## 2024-07-03 ENCOUNTER — Other Ambulatory Visit (HOSPITAL_COMMUNITY): Payer: Self-pay

## 2024-07-03 MED ORDER — CELECOXIB 200 MG PO CAPS
200.0000 mg | ORAL_CAPSULE | Freq: Every day | ORAL | 1 refills | Status: DC
  Filled 2024-07-09: qty 30, 30d supply, fill #0
  Filled 2024-11-29 (×2): qty 30, 30d supply, fill #1

## 2024-07-09 ENCOUNTER — Other Ambulatory Visit (HOSPITAL_COMMUNITY): Payer: Self-pay

## 2024-07-14 DIAGNOSIS — G4733 Obstructive sleep apnea (adult) (pediatric): Secondary | ICD-10-CM | POA: Diagnosis not present

## 2024-07-14 DIAGNOSIS — I10 Essential (primary) hypertension: Secondary | ICD-10-CM | POA: Diagnosis not present

## 2024-07-23 ENCOUNTER — Other Ambulatory Visit (HOSPITAL_COMMUNITY): Payer: Self-pay

## 2024-07-23 DIAGNOSIS — S20212A Contusion of left front wall of thorax, initial encounter: Secondary | ICD-10-CM | POA: Diagnosis not present

## 2024-07-23 DIAGNOSIS — M25552 Pain in left hip: Secondary | ICD-10-CM | POA: Diagnosis not present

## 2024-07-23 DIAGNOSIS — S7012XA Contusion of left thigh, initial encounter: Secondary | ICD-10-CM | POA: Diagnosis not present

## 2024-07-23 DIAGNOSIS — S7002XA Contusion of left hip, initial encounter: Secondary | ICD-10-CM | POA: Diagnosis not present

## 2024-07-23 DIAGNOSIS — Z6841 Body Mass Index (BMI) 40.0 and over, adult: Secondary | ICD-10-CM | POA: Diagnosis not present

## 2024-07-23 DIAGNOSIS — M1612 Unilateral primary osteoarthritis, left hip: Secondary | ICD-10-CM | POA: Diagnosis not present

## 2024-07-23 DIAGNOSIS — R0781 Pleurodynia: Secondary | ICD-10-CM | POA: Diagnosis not present

## 2024-07-23 MED ORDER — CELECOXIB 200 MG PO CAPS
200.0000 mg | ORAL_CAPSULE | Freq: Two times a day (BID) | ORAL | 1 refills | Status: AC
  Filled 2024-07-23 – 2024-07-27 (×5): qty 180, 90d supply, fill #0
  Filled 2024-07-31: qty 60, 30d supply, fill #0
  Filled 2024-08-23 – 2024-08-30 (×2): qty 60, 30d supply, fill #1
  Filled 2024-09-22 – 2024-10-01 (×2): qty 60, 30d supply, fill #2
  Filled 2024-10-26 – 2024-10-29 (×2): qty 60, 30d supply, fill #3
  Filled 2024-12-03 – 2024-12-24 (×2): qty 60, 30d supply, fill #4
  Filled 2025-01-23: qty 60, 30d supply, fill #5

## 2024-07-24 ENCOUNTER — Other Ambulatory Visit (HOSPITAL_COMMUNITY): Payer: Self-pay

## 2024-07-25 ENCOUNTER — Other Ambulatory Visit (HOSPITAL_COMMUNITY): Payer: Self-pay

## 2024-07-26 ENCOUNTER — Other Ambulatory Visit (HOSPITAL_COMMUNITY): Payer: Self-pay

## 2024-07-26 ENCOUNTER — Other Ambulatory Visit: Payer: Self-pay

## 2024-07-27 ENCOUNTER — Other Ambulatory Visit: Payer: Self-pay

## 2024-07-27 ENCOUNTER — Other Ambulatory Visit (HOSPITAL_COMMUNITY): Payer: Self-pay

## 2024-07-30 DIAGNOSIS — M48062 Spinal stenosis, lumbar region with neurogenic claudication: Secondary | ICD-10-CM | POA: Diagnosis not present

## 2024-07-30 DIAGNOSIS — M5416 Radiculopathy, lumbar region: Secondary | ICD-10-CM | POA: Diagnosis not present

## 2024-07-30 DIAGNOSIS — Z79899 Other long term (current) drug therapy: Secondary | ICD-10-CM | POA: Diagnosis not present

## 2024-07-30 DIAGNOSIS — M5126 Other intervertebral disc displacement, lumbar region: Secondary | ICD-10-CM | POA: Diagnosis not present

## 2024-07-31 ENCOUNTER — Other Ambulatory Visit: Payer: Self-pay

## 2024-07-31 ENCOUNTER — Other Ambulatory Visit (HOSPITAL_COMMUNITY): Payer: Self-pay

## 2024-08-07 DIAGNOSIS — M1612 Unilateral primary osteoarthritis, left hip: Secondary | ICD-10-CM | POA: Diagnosis not present

## 2024-08-15 ENCOUNTER — Other Ambulatory Visit: Payer: Self-pay

## 2024-08-15 ENCOUNTER — Other Ambulatory Visit (HOSPITAL_COMMUNITY): Payer: Self-pay

## 2024-08-17 DIAGNOSIS — M5416 Radiculopathy, lumbar region: Secondary | ICD-10-CM | POA: Diagnosis not present

## 2024-08-17 DIAGNOSIS — M25551 Pain in right hip: Secondary | ICD-10-CM | POA: Diagnosis not present

## 2024-08-17 DIAGNOSIS — G894 Chronic pain syndrome: Secondary | ICD-10-CM | POA: Diagnosis not present

## 2024-08-17 DIAGNOSIS — M25552 Pain in left hip: Secondary | ICD-10-CM | POA: Diagnosis not present

## 2024-08-23 ENCOUNTER — Other Ambulatory Visit (HOSPITAL_COMMUNITY): Payer: Self-pay

## 2024-08-28 DIAGNOSIS — I10 Essential (primary) hypertension: Secondary | ICD-10-CM | POA: Diagnosis not present

## 2024-08-28 DIAGNOSIS — N401 Enlarged prostate with lower urinary tract symptoms: Secondary | ICD-10-CM | POA: Diagnosis not present

## 2024-08-28 DIAGNOSIS — M1A00X0 Idiopathic chronic gout, unspecified site, without tophus (tophi): Secondary | ICD-10-CM | POA: Diagnosis not present

## 2024-08-28 DIAGNOSIS — I4891 Unspecified atrial fibrillation: Secondary | ICD-10-CM | POA: Diagnosis not present

## 2024-08-28 DIAGNOSIS — F3341 Major depressive disorder, recurrent, in partial remission: Secondary | ICD-10-CM | POA: Diagnosis not present

## 2024-08-28 DIAGNOSIS — M129 Arthropathy, unspecified: Secondary | ICD-10-CM | POA: Diagnosis not present

## 2024-08-28 DIAGNOSIS — J449 Chronic obstructive pulmonary disease, unspecified: Secondary | ICD-10-CM | POA: Diagnosis not present

## 2024-08-28 DIAGNOSIS — E1140 Type 2 diabetes mellitus with diabetic neuropathy, unspecified: Secondary | ICD-10-CM | POA: Diagnosis not present

## 2024-08-28 DIAGNOSIS — F419 Anxiety disorder, unspecified: Secondary | ICD-10-CM | POA: Diagnosis not present

## 2024-08-28 DIAGNOSIS — G4733 Obstructive sleep apnea (adult) (pediatric): Secondary | ICD-10-CM | POA: Diagnosis not present

## 2024-08-28 DIAGNOSIS — F17210 Nicotine dependence, cigarettes, uncomplicated: Secondary | ICD-10-CM | POA: Diagnosis not present

## 2024-08-28 DIAGNOSIS — Z0000 Encounter for general adult medical examination without abnormal findings: Secondary | ICD-10-CM | POA: Diagnosis not present

## 2024-08-28 DIAGNOSIS — E7800 Pure hypercholesterolemia, unspecified: Secondary | ICD-10-CM | POA: Diagnosis not present

## 2024-08-31 ENCOUNTER — Other Ambulatory Visit (HOSPITAL_COMMUNITY): Payer: Self-pay

## 2024-09-03 ENCOUNTER — Other Ambulatory Visit (HOSPITAL_COMMUNITY): Payer: Self-pay

## 2024-09-05 ENCOUNTER — Other Ambulatory Visit (HOSPITAL_COMMUNITY): Payer: Self-pay

## 2024-09-05 DIAGNOSIS — G629 Polyneuropathy, unspecified: Secondary | ICD-10-CM | POA: Diagnosis not present

## 2024-09-06 ENCOUNTER — Telehealth: Payer: Self-pay

## 2024-09-06 ENCOUNTER — Other Ambulatory Visit (HOSPITAL_COMMUNITY): Payer: Self-pay

## 2024-09-06 DIAGNOSIS — L111 Transient acantholytic dermatosis [Grover]: Secondary | ICD-10-CM

## 2024-09-06 MED ORDER — MOMETASONE FUROATE 0.1 % EX CREA
1.0000 | TOPICAL_CREAM | CUTANEOUS | 0 refills | Status: DC
  Filled 2024-09-06: qty 45, 20d supply, fill #0

## 2024-09-06 NOTE — Telephone Encounter (Signed)
 Patient called asking for a RF of his Mometasone  Cream. 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 Patient states he is having flare again. Okay to refill?   Patient was last seen in March of 2025 and scheduled for his one year March 2026.

## 2024-09-06 NOTE — Telephone Encounter (Signed)
Medication sent in.aw

## 2024-09-07 ENCOUNTER — Other Ambulatory Visit (HOSPITAL_COMMUNITY): Payer: Self-pay

## 2024-09-07 DIAGNOSIS — M1612 Unilateral primary osteoarthritis, left hip: Secondary | ICD-10-CM | POA: Diagnosis not present

## 2024-09-07 DIAGNOSIS — M48062 Spinal stenosis, lumbar region with neurogenic claudication: Secondary | ICD-10-CM | POA: Diagnosis not present

## 2024-09-10 ENCOUNTER — Other Ambulatory Visit: Payer: Self-pay

## 2024-09-10 ENCOUNTER — Other Ambulatory Visit (HOSPITAL_COMMUNITY): Payer: Self-pay

## 2024-09-10 MED ORDER — VENLAFAXINE HCL ER 75 MG PO CP24
75.0000 mg | ORAL_CAPSULE | Freq: Two times a day (BID) | ORAL | 0 refills | Status: DC
  Filled 2024-09-10: qty 60, 30d supply, fill #0

## 2024-09-10 MED ORDER — VENLAFAXINE HCL ER 75 MG PO CP24
75.0000 mg | ORAL_CAPSULE | Freq: Two times a day (BID) | ORAL | 1 refills | Status: AC
  Filled 2024-09-10 – 2024-10-03 (×3): qty 180, 90d supply, fill #0
  Filled 2024-12-31: qty 180, 90d supply, fill #1

## 2024-09-24 ENCOUNTER — Other Ambulatory Visit: Payer: Self-pay | Admitting: Physical Medicine and Rehabilitation

## 2024-09-24 DIAGNOSIS — M5126 Other intervertebral disc displacement, lumbar region: Secondary | ICD-10-CM

## 2024-09-24 DIAGNOSIS — M5416 Radiculopathy, lumbar region: Secondary | ICD-10-CM

## 2024-09-24 DIAGNOSIS — M48062 Spinal stenosis, lumbar region with neurogenic claudication: Secondary | ICD-10-CM

## 2024-09-24 DIAGNOSIS — Z79899 Other long term (current) drug therapy: Secondary | ICD-10-CM | POA: Diagnosis not present

## 2024-09-27 ENCOUNTER — Other Ambulatory Visit (HOSPITAL_COMMUNITY): Payer: Self-pay

## 2024-09-29 ENCOUNTER — Ambulatory Visit
Admission: RE | Admit: 2024-09-29 | Discharge: 2024-09-29 | Disposition: A | Discharge status: Home / Self Care | Source: Ambulatory Visit | Attending: Physical Medicine and Rehabilitation | Admitting: Physical Medicine and Rehabilitation

## 2024-09-29 DIAGNOSIS — M48062 Spinal stenosis, lumbar region with neurogenic claudication: Secondary | ICD-10-CM

## 2024-09-29 DIAGNOSIS — M5117 Intervertebral disc disorders with radiculopathy, lumbosacral region: Secondary | ICD-10-CM | POA: Diagnosis not present

## 2024-09-29 DIAGNOSIS — M5416 Radiculopathy, lumbar region: Secondary | ICD-10-CM

## 2024-09-29 DIAGNOSIS — M4727 Other spondylosis with radiculopathy, lumbosacral region: Secondary | ICD-10-CM | POA: Diagnosis not present

## 2024-09-29 DIAGNOSIS — M5126 Other intervertebral disc displacement, lumbar region: Secondary | ICD-10-CM

## 2024-09-29 DIAGNOSIS — M48061 Spinal stenosis, lumbar region without neurogenic claudication: Secondary | ICD-10-CM | POA: Diagnosis not present

## 2024-10-01 ENCOUNTER — Other Ambulatory Visit (HOSPITAL_COMMUNITY): Payer: Self-pay

## 2024-10-03 ENCOUNTER — Other Ambulatory Visit (HOSPITAL_COMMUNITY): Payer: Self-pay

## 2024-10-03 ENCOUNTER — Other Ambulatory Visit: Payer: Self-pay

## 2024-10-05 ENCOUNTER — Other Ambulatory Visit (HOSPITAL_COMMUNITY): Payer: Self-pay

## 2024-10-05 NOTE — Progress Notes (Addendum)
 Referring Physician:  Jeffie Cheryl BRAVO, MD [address] [place],  [place] [address]  Primary Physician:  Jeffie Cheryl BRAVO, MD  History of Present Illness: 10/11/2024 Adrian [name] has a history of HTN, PE x 2, afib, OSA, GI bleed, polyneuropathy, chronic pain, anxiety, hyperlipidemia, obesity.   Has seen Dr. Unice and surgery was not recommended. Has seen Dr. Avanell and Dr. Naveira. Dr. Clois saw him back in 2020 and discussed surgery (L4-L5 decompression) with him. Patient declined.   He sees [facility].   He has constant LBP with bilateral posterior leg pain to his feet. LBP < leg pain. He has numbness, tingling, and weakness in both legs. Pain is worse with walking and better with laying down. He feels like he is off balance.   No dexterity issues with his hands.   02/28/2023- EMG Dr Maree- showed generalized sensory polyneuropathy in bilateral lower extremities.   He is on ELIQUIS .   Tobacco use: smokes 2 cigarettes per day x 55 years.   Bowel/Bladder Dysfunction: none  Conservative measures:  Physical therapy:  has not participated in for his back, did 2 visits for balance 09/07/23 and 09/12/23 Multimodal medical therapy including regular antiinflammatories:  Prednisone, Tylenol , Gabapentin , Cymbalta, Mobic, Hydrocodone  Injections:   03/19/2024: Left hip joint injection under ultrasound (no relief, Dr. Sharrie) 03/07/2024: Bilateral L4-5 transforaminal ESI (good relief x 2 weeks, Celestone  12 mg) 04/26/2023: Bilateral L4-5 transforaminal ESI (moderate to good relief x 2 weeks, then 50% relief, Celestone  12 mg) 03/09/2023: Left trochanteric bursa injection Durenda Doyne) 12/24/2022: Bilateral L4-5 transforaminal ESI (moderate to good relief, Celestone  12 mg) 08/31/2022: Bilateral L4-5 transforaminal ESI (moderate to good relief, dexamethasone  13 mg) 04/29/2022: Bilateral L4-5 transforaminla ESI (less effective, dexamethasone  13 mg) 04/08/2022: Bilateral  L4 trigger point injections (temporary relief) 10/22/2021: Bilateral L4-5 transforaminal epidural injections (60% improvement, dexamethasone  13 mg) 06/02/2021: Right subacromial injection (good relief) 04/30/2021: Left L4-5 transforaminal ESI (55% relief, ODI: 29%, Celestone  12 mg) 10/28/2020: Bilateral L4-5 transforaminal ESI (mild relief of left sided pain,ODI: 36%, dexamethasone  13 mg) 09/04/2020: Right L5 trigger point injection He has had an ESI at [facility] around late June and states he had good relief but would like to say with our office. 05/22/2020: Bilateral S1 transforaminal ESI (ODI: 28%) 03/06/2020: Right L5-S1 and right S1 transforaminal ESI (mild relief) 07/31/2019: Bilateral S1 transforaminal ESI (moderate relief) 05/17/2019: Right L5-S1 and right S1 transforaminal ESI  04/02/2019: Right L5-S1 and right S1 transforaminal ESI  11/21/2018: Right L5-S1 and right S1 transforaminal ESI  09/22/2018: Right hip injection (good relief) 07/27/2018: Right L5-S1 and right S1 transforaminal ESI (moderate relief) 06/29/2018: Right hip injection (good relief) 05/24/2018: Right L5 trigger point injection (minimal relief) 04/05/2018: Right L5-S1 and right S1 transforaminal ESI (good relief x6 weeks) 01/26/2018: Right hip joint injection (moderate to good relief) 01/09/2018: Right L5 TPI 12/26/2017: Left L5 TPI (good relief) 12/06/2017: Left L5-S1 and right S1 transforaminal ESI (good relief of right leg pain, no help for left leg pain) 09/22/2017: Right L5-S1 and right S1 transforaminal ESI (moderate to good relief) 07/21/2017: Right L5-S1 and right S1 transforaminal ESI 04/12/2017: Right hip joint injection (good relief) 03/30/2017: Right L5-S1 and right S1 transforaminal ESI (at least 50% relief) 02/22/2017: Right L5-S1 and right S1 transforaminal ESI 06/23/2016: Right hip joint injection 10/04/2014: Bilateral S1 transforaminal ESI (3 days of relief) 12/06/2013: Right L5  trigger-point injection (3 days of relief)  03/01/2012: Right L5-S1 and left S1 transforaminal ESI (4 days  of relief)   Past Surgery: no spine surgery  Adrian [name] has no symptoms of cervical myelopathy.  The symptoms are causing a significant impact on the patient's life.   Review of Systems:  A 10 point review of systems is negative, except for the pertinent positives and negatives detailed in the HPI.  Past Medical History: Past Medical History:  Diagnosis Date   Anxiety    Arthritis    rheumatoid   Atypical mole 10/22/2015   R mid back paraspinal/mod   Basal cell carcinoma of skin 01/29/2010   L nasolabial fold   Basal cell carcinoma of skin 04/22/2015   L ear sup to tragus/excision   Basal cell carcinoma of skin 11/25/2015   R tragus/excision   BCC (basal cell carcinoma of skin) 02/23/2024   Left nasal ala - ED&C done   BCC (basal cell carcinoma of skin) 02/22/2024   left nasal ala - ED&C done   Colon polyps    Depression    GERD (gastroesophageal reflux disease)    Gout    Hemorrhoids    History of basal cell carcinoma (BCC) 05/15/2020    right lower lip/chin   Hypertension    Lumbar radiculopathy 09/25/2014   Neuropathy    Osteoarthritis    Pneumonia    Pulmonary embolism (HCC) 05/25/2014   Overview:  Times 2 on anticoagulation a.  Times two.   b.  Filter placed.    Last Assessment & Plan:  Times 2 on anticoagulation a.  Times two.   b.  Filter placed. No current bleeding noted.     Reflux    Sleep apnea    Umbilical hernia     Past Surgical History: Past Surgical History:  Procedure Laterality Date   CARDIOVERSION N/A 03/18/2021   Procedure: CARDIOVERSION;  Surgeon: Hester Wolm PARAS, MD;  Location: [facility] ORS;  Service: Cardiovascular;  Laterality: N/A;   CARDIOVERSION N/A 09/13/2023   Procedure: CARDIOVERSION;  Surgeon: Florencio Cara BIRCH, MD;  Location: [facility] ORS;  Service: Cardiovascular;  Laterality: N/A;   COLONOSCOPY     ESOPHAGOGASTRODUODENOSCOPY      HERNIA REPAIR     umbilical   IVC FILTER INSERTION  04/15/2014   Bard Denali IVC filter by Dr. Marea   TOTAL HIP ARTHROPLASTY Right 01/02/2019   Procedure: TOTAL HIP ARTHROPLASTY ANTERIOR APPROACH;  Surgeon: Kathlynn Sharper, MD;  Location: [facility] ORS;  Service: Orthopedics;  Laterality: Right;    Allergies: Allergies as of 10/11/2024 - Review Complete 10/11/2024  Allergen Reaction Noted   Amiodarone Shortness Of Breath 04/16/2021   Oxycodone  Itching 05/10/2024   Shellfish allergy Swelling, Anaphylaxis, and Other (See Comments) 02/09/2016   Duloxetine Other (See Comments) 03/06/2015   Pseudoephedrine hcl Other (See Comments) 02/09/2016   Pseudoephedrine Other (See Comments) 02/09/2016   Pseudoephedrine hcl Other (See Comments) 02/09/2016   Doxycycline  Rash 01/04/2024    Medications: Outpatient Encounter Medications as of 10/11/2024  Medication Sig   acetaminophen  (TYLENOL ) 500 MG tablet Take 1,000 mg by mouth daily.   allopurinol  (ZYLOPRIM ) 300 MG tablet Take 1 tablet (300 mg total) by mouth daily.   amLODipine  (NORVASC ) 2.5 MG tablet Take 1 tablet (2.5 mg total) by mouth daily.   apixaban  (ELIQUIS ) 5 MG TABS tablet Take 1 tablet (5 mg total) by mouth 2 (two) times daily.   benazepril  (LOTENSIN ) 5 MG tablet Take 1 tablet (5 mg total) by mouth 2 (two) times daily.   bisacodyl  (DULCOLAX) 5 MG EC tablet Take 1 tablet (5  mg total) by mouth daily as needed for moderate constipation.   celecoxib  (CELEBREX ) 200 MG capsule Take 1 capsule (200 mg total) by mouth daily.   celecoxib  (CELEBREX ) 200 MG capsule Take 1 capsule (200 mg total) by mouth 2 (two) times daily.   Cholecalciferol (VITAMIN D3) 50 MCG (2000 UT) TABS Take 2,000 mg by mouth daily.   gabapentin  (NEURONTIN ) 300 MG capsule Take 1 capsule (300 mg total) by mouth 2 (two) times daily.   HYDROcodone -acetaminophen  (NORCO) 10-325 MG tablet Take 0.5-1 tablets by mouth 3 (three) times daily as needed.   lidocaine  (LIDODERM ) 5 %  SMARTSIG:Topical   metoprolol  tartrate (LOPRESSOR ) 25 MG tablet Take 1 tablet (25 mg total) by mouth 2 (two) times daily.   mometasone  (ELOCON ) 0.1 % cream Apply 1 Application topically as directed once daily up to 5 days a week to affected area(s) of rash on chest as needed for itchy flares   Multiple Vitamin (MULTIVITAMIN WITH MINERALS) TABS tablet Take 1 tablet by mouth daily. One-A-Day   naloxone (NARCAN) nasal spray 4 mg/0.1 mL Place 1 spray into the nose once.   pantoprazole  (PROTONIX ) 40 MG tablet Take 1 tablet (40 mg total) by mouth daily.   potassium chloride  (KLOR-CON ) 10 MEQ tablet Take 1 tablet (10 mEq total) by mouth daily.   Probiotic Product (PHILLIPS COLON HEALTH) CAPS Take 1 capsule by mouth daily.   silodosin  (RAPAFLO ) 8 MG CAPS capsule Take 1 capsule (8 mg total) by mouth daily with breakfast.   torsemide  (DEMADEX ) 10 MG tablet Take 1 tablet (10 mg total) by mouth daily.   venlafaxine  XR (EFFEXOR -XR) 75 MG 24 hr capsule Take 75 mg by mouth 2 (two) times daily.    venlafaxine  XR (EFFEXOR -XR) 75 MG 24 hr capsule Take 1 capsule (75 mg total) by mouth 2 (two) times daily.   venlafaxine  XR (EFFEXOR -XR) 75 MG 24 hr capsule Take 1 capsule (75 mg total) by mouth 2 (two) times daily.   vitamin B-12 (CYANOCOBALAMIN ) 1000 MCG tablet Take 1,000 mcg by mouth daily.   carbamazepine (TEGRETOL) 100 MG chewable tablet Chew 100 mg by mouth at bedtime.   [DISCONTINUED] allopurinol  (ZYLOPRIM ) 300 MG tablet Take 1 tablet (300 mg total) by mouth daily.   [DISCONTINUED] gabapentin  (NEURONTIN ) 300 MG capsule Take 1 capsule (300 mg total) by mouth 2 (two) times daily.   No facility-administered encounter medications on file as of 10/11/2024.    Social History: Social History   Tobacco Use   Smoking status: Light Smoker    Current packs/day: 0.10    Average packs/day: 0.1 packs/day for 54.0 years (5.4 ttl pk-yrs)    Types: Cigarettes   Smokeless tobacco: Never   Tobacco comments:    Smokes  2-3 cigarettes daily   Vaping Use   Vaping status: Never Used  Substance Use Topics   Alcohol use: Not Currently    Alcohol/week: 0.0 standard drinks of alcohol   Drug use: No    Family Medical History: Family History  Problem Relation Age of Onset   Diabetes Mother    Cancer Father     Physical Examination: Vitals:   10/11/24 0956  BP: 118/78    General: Patient is well developed, well nourished, calm, collected, and in no apparent distress. Attention to examination is appropriate.  Respiratory: Patient is breathing without any difficulty.   NEUROLOGICAL:     Awake, alert, oriented to person, place, and time.  Speech is clear and fluent. Fund of knowledge is appropriate.  Cranial Nerves: Pupils equal round and reactive to light.  Facial tone is symmetric.    No abnormal lesions on exposed skin.   Strength: Side Biceps Triceps Deltoid Interossei Grip Wrist Ext. Wrist Flex.  R 5 5 5 5 5 5 5   L 5 5 5 5 5 5 5    Side Iliopsoas Quads Hamstring PF DF EHL  R 5 5 5 5 5 5   L 5 5 5 5 5 5    Reflexes are 2+ and symmetric at the biceps, brachioradialis, patella and achilles.   Hoffman's is absent.  Clonus is not present.   Bilateral upper and lower extremity sensation is intact to light touch.     No pain with IR/ER of both hips.   Gait is slow.   Medical Decision Making  Imaging: Lumbar MRI dated 09/29/24:  FINDINGS: Segmentation: Conventional anatomy assumed, with the last open disc space designated L5-S1.Concordant with prior imaging.   Alignment: Chronic straightening of the usual lumbar lordosis. No focal angulation or significant listhesis.   Vertebrae: No worrisome osseous lesion, acute fracture or pars defect. Chronic multilevel endplate degenerative changes, similar to previous study. Previously noted edema within the right L5 pedicle has improved. The lumbar pedicles are diffusely short on a congenital basis. Partially ankylosing osteophytes are noted  at the left sacroiliac joint.   Conus medullaris: Extends to the L1-2 level. The conus medullaris appears normal. There is diffuse tortuosity of the cauda equina nerve roots attributed to spinal stenosis.   Paraspinal and other soft tissues: No significant paraspinal findings. Multilevel paraspinal osteophytes. Small renal cysts bilaterally for which no specific follow-up imaging is recommended.   Disc levels:   Sagittal images demonstrate similar chronic disc bulging at T11-12 with resulting partial effacement of the CSF surrounding the cord. Stable asymmetric right-sided facet hypertrophy and mild right-sided foraminal narrowing.   T12-L1: Stable chronic disc bulging with asymmetric paraspinal osteophyte formation on the right. Mild bilateral facet hypertrophy. Stable mild foraminal narrowing bilaterally.   L1-2: Stable mild loss of disc height with disc bulging, endplate osteophytes and bilateral facet hypertrophy. Stable mild multifactorial spinal stenosis and mild left foraminal narrowing.   L2-3: Moderate loss of disc height with annular disc bulging and endplate osteophytes asymmetric to the left. There is moderate facet and ligamentous hypertrophy with prominent epidural fat, asymmetric to the left. Resulting moderate multifactorial spinal stenosis with mild right and moderate left foraminal narrowing.   L3-4: Disc height is relatively maintained at this level. Stable disc bulging with endplate osteophyte formation and bilateral facet hypertrophy. Stable mild multifactorial spinal stenosis. No significant foraminal narrowing.   L4-5: Chronic loss of disc height with annular disc bulging and endplate osteophytes asymmetric to the left. Moderate facet and ligamentous hypertrophy. Superimposed on congenital factors, there is resulting moderate to severe multifactorial spinal stenosis with moderate lateral recess and mild foraminal narrowing bilaterally.   L5-S1:  Chronic loss of disc height with annular disc bulging and endplate osteophytes asymmetric to the right. Mild to moderate bilateral facet hypertrophy contributes to chronic moderate to severe asymmetric narrowing of the right lateral recess and right foramen. No significant central spinal stenosis.   IMPRESSION: 1. Compared with previous MRI from 03/25/2023, there is less marrow edema within the right L5 pedicle, likely reactive. No acute findings or other significant changes are identified. 2. Multilevel spondylosis superimposed on a congenitally small spinal canal with resulting multilevel spinal stenosis, greatest at L4-5. 3. Chronic moderate to severe asymmetric narrowing of the right lateral  recess and right foramen at L5-S1. 4. Moderate multifactorial spinal stenosis at L2-3 with mild right and moderate left foraminal narrowing. 5. Mild multifactorial spinal stenosis at L1-2 and L3-4.     Electronically Signed   By: Elsie Perone M.D.   On: 09/29/2024 11:34    I have personally reviewed the images and agree with the above interpretation.  Assessment and Plan: Adrian [name] has constant LBP with bilateral posterior leg pain to his feet. LBP < leg pain. He has numbness, tingling, and weakness in both legs.   He has known mild central stenosis L1-L2 and L3-L4, moderate central stenosis L2-L3, and moderate/severe central stenosis L4-L5. He has multilevel foraminal stenosis with severe right foraminal stenosis L5-S1.   Previous EMG of lower extremities 02/28/23 showed generalized sensory polyneuropathy in bilateral lower extremities.   Treatment options discussed with patient and following plan made:   - He is interested in any possible surgery options for his back.  - Will review with Dr. Clois and call him with further recommendations.  - We did discuss that he would need to do PT prior to any surgical discussion. He would need to work on weight loss as well. He also has  multiple comorbidities.  - Okay to proceed with lumbar injection with Dr. Avanell next week to help with pain.  - He sees [facility] and will discuss further medications with them.   I spent a total of 45 minutes in face-to-face and non-face-to-face activities related to this patient's care today including review of outside records, review of imaging, review of symptoms, physical exam, discussion of differential diagnosis, discussion of treatment options, and documentation.   Thank you for involving me in the care of this patient.   ADDENDUM 10/18/24:  Reviewed with Dr. Clois. He would likely recommend a similar procedure that he discussed previously (L4-L5 decompression). He wants to see patient back to discuss further after he has finished PT and his BMI is < 40. Will set up phone visit to discuss.   Adrian Boys PA-C Dept. of Neurosurgery

## 2024-10-06 ENCOUNTER — Other Ambulatory Visit (HOSPITAL_COMMUNITY): Payer: Self-pay

## 2024-10-08 ENCOUNTER — Other Ambulatory Visit (HOSPITAL_COMMUNITY): Payer: Self-pay

## 2024-10-08 ENCOUNTER — Other Ambulatory Visit: Payer: Self-pay

## 2024-10-08 MED ORDER — ALLOPURINOL 300 MG PO TABS
300.0000 mg | ORAL_TABLET | Freq: Every day | ORAL | 3 refills | Status: AC
  Filled 2024-10-08: qty 90, 90d supply, fill #0
  Filled 2024-12-30: qty 90, 90d supply, fill #1

## 2024-10-08 MED ORDER — GABAPENTIN 300 MG PO CAPS
300.0000 mg | ORAL_CAPSULE | Freq: Two times a day (BID) | ORAL | 3 refills | Status: AC
  Filled 2024-10-08: qty 180, 90d supply, fill #0
  Filled 2024-12-30: qty 180, 90d supply, fill #1

## 2024-10-09 ENCOUNTER — Other Ambulatory Visit (HOSPITAL_COMMUNITY): Payer: Self-pay

## 2024-10-09 DIAGNOSIS — Z1331 Encounter for screening for depression: Secondary | ICD-10-CM | POA: Diagnosis not present

## 2024-10-09 DIAGNOSIS — Z03818 Encounter for observation for suspected exposure to other biological agents ruled out: Secondary | ICD-10-CM | POA: Diagnosis not present

## 2024-10-09 DIAGNOSIS — R509 Fever, unspecified: Secondary | ICD-10-CM | POA: Diagnosis not present

## 2024-10-10 ENCOUNTER — Other Ambulatory Visit (HOSPITAL_COMMUNITY): Payer: Self-pay

## 2024-10-11 ENCOUNTER — Ambulatory Visit: Admitting: Orthopedic Surgery

## 2024-10-11 ENCOUNTER — Encounter: Payer: Self-pay | Admitting: Orthopedic Surgery

## 2024-10-11 VITALS — BP 118/78 | Ht 71.0 in | Wt 302.0 lb

## 2024-10-11 DIAGNOSIS — M47816 Spondylosis without myelopathy or radiculopathy, lumbar region: Secondary | ICD-10-CM

## 2024-10-11 DIAGNOSIS — M48062 Spinal stenosis, lumbar region with neurogenic claudication: Secondary | ICD-10-CM | POA: Diagnosis not present

## 2024-10-11 DIAGNOSIS — G629 Polyneuropathy, unspecified: Secondary | ICD-10-CM

## 2024-10-11 DIAGNOSIS — M5416 Radiculopathy, lumbar region: Secondary | ICD-10-CM

## 2024-10-11 NOTE — Patient Instructions (Signed)
 It was so nice to see you today. Thank you so much for coming in.    You have wear and tear in your back with spinal stenosis (pressure on the spinal cord).   I want to review your imaging with Dr. Clois to see what he thinks regarding any surgery options. You would need to do PT prior to considering any surgery.   I will call you with his recommendations.   Okay to get nerve block with Dr. Avanell.   Please do not hesitate to call if you have any questions or concerns. You can also message me in MyChart.   Glade Boys PA-C [phone]     The physicians and staff at [facility] at [facility] are committed to providing excellent care. You may receive a survey asking for feedback about your experience at our office. We value you your feedback and appreciate you taking the time to to fill it out. The [facility] leadership team is also available to discuss your experience in person, feel free to contact us  [phone].

## 2024-10-12 ENCOUNTER — Telehealth: Payer: Self-pay | Admitting: Cardiovascular Disease

## 2024-10-12 NOTE — Telephone Encounter (Signed)
 Pt is requesting a callback regarding him wanting to discuss his Neurosurgery appt he had yesterday. He was advised to contact his cardiologist regarding a murmur. Please advise

## 2024-10-12 NOTE — Telephone Encounter (Signed)
 Returned pt's call and verified pt's identity using 2 identifier. Patient called seeking advice following a neurosurgery consultation regarding potential back surgery. During the visit, the PA reportedly detected a heart murmur and advised the patient to follow up with their cardiologist. Patient inquired whether an appointment is necessary or if any medication adjustments are needed.  Pt denies having any new or worsening cardiac symptoms.  I explained that if it was decided that he have back surgery, he would most likely need a cardiac clearance and the detected heart murmur could be addressed at that time.  However, I informed the patient that I would forward these concerns to [name] for further evaluation and guidance

## 2024-10-15 DIAGNOSIS — I10 Essential (primary) hypertension: Secondary | ICD-10-CM | POA: Diagnosis not present

## 2024-10-17 NOTE — Telephone Encounter (Signed)
 Called and spoke with patient. Notified him of the following from [name].  I would agree with prior note If he is requiring clearance before neurosurgery procedure, we can schedule a quick preop visit to see if murmur is appreciated He was seen several months ago, no significant murmur on exam so I do not think it is going to be a big issue If no surgery planned, we can reevaluate him on his next clinic visit No further medication changes or testing would be needed until that assessment Thx TGollan  Patient verbalized understanding.

## 2024-10-18 DIAGNOSIS — Z79899 Other long term (current) drug therapy: Secondary | ICD-10-CM | POA: Diagnosis not present

## 2024-10-18 DIAGNOSIS — M5416 Radiculopathy, lumbar region: Secondary | ICD-10-CM | POA: Diagnosis not present

## 2024-10-18 DIAGNOSIS — M5126 Other intervertebral disc displacement, lumbar region: Secondary | ICD-10-CM | POA: Diagnosis not present

## 2024-10-18 DIAGNOSIS — M48062 Spinal stenosis, lumbar region with neurogenic claudication: Secondary | ICD-10-CM | POA: Diagnosis not present

## 2024-10-19 ENCOUNTER — Ambulatory Visit: Admitting: Orthopedic Surgery

## 2024-10-19 ENCOUNTER — Encounter: Payer: Self-pay | Admitting: Orthopedic Surgery

## 2024-10-19 DIAGNOSIS — M5416 Radiculopathy, lumbar region: Secondary | ICD-10-CM

## 2024-10-19 DIAGNOSIS — M48062 Spinal stenosis, lumbar region with neurogenic claudication: Secondary | ICD-10-CM | POA: Diagnosis not present

## 2024-10-19 DIAGNOSIS — M47816 Spondylosis without myelopathy or radiculopathy, lumbar region: Secondary | ICD-10-CM

## 2024-10-19 DIAGNOSIS — M4726 Other spondylosis with radiculopathy, lumbar region: Secondary | ICD-10-CM | POA: Diagnosis not present

## 2024-10-19 NOTE — Progress Notes (Signed)
 Telephone Visit- Progress Note: Referring Physician:  Jeffie Cheryl BRAVO, MD [address] [place],  [place] [address]  Primary Physician:  Adrian Cheryl BRAVO, MD  This visit was performed via telephone.  Patient location: home Provider location: office  I spent a total of 10 minutes non-face-to-face activities for this visit on the date of this encounter including review of current clinical condition and response to treatment.    Patient has given verbal consent to this telephone visits and we reviewed the limitations of a telephone visit. Patient wishes to proceed.    Chief Complaint:  follow up  History of Present Illness: Adrian [name] is a 78 y.o. male has a history of  HTN, PE x 2, afib, OSA, GI bleed, polyneuropathy, chronic pain, anxiety, hyperlipidemia, obesity.    Has seen Dr. Unice and surgery was not recommended. Has seen Dr. Avanell and Dr. Naveira. Dr. Clois saw him back in 2020 and discussed surgery (L4-L5 decompression) with him. Patient declined.    He sees [facility].   Last seen by me on 10/11/24 for back and bilateral leg pain. He has known mild central stenosis L1-L2 and L3-L4, moderate central stenosis L2-L3, and moderate/severe central stenosis L4-L5. He has multilevel foraminal stenosis with severe right foraminal stenosis L5-S1.   Previous EMG of lower extremities 02/28/23 showed generalized sensory polyneuropathy in bilateral lower extremities.    Patient reviewed with Dr. Clois after his last visit. Phone visit scheduled to discuss his recommendations.   He is the same. He has constant LBP with bilateral posterior leg pain to his feet. He has numbness, tingling, and weakness in both legs. Pain is worse with walking and better with laying down.   He saw Dr. Avanell yesterday and is scheduled for repeat ESI on 11/06/24.   He would like to start PT.    He is on ELIQUIS .    Tobacco use: smokes 2 cigarettes per day x 55 years.     Bowel/Bladder Dysfunction: none   Conservative measures:  Physical therapy:  has not participated in for his back, did 2 visits for balance 09/07/23 and 09/12/23 Multimodal medical therapy including regular antiinflammatories:  Prednisone, Tylenol , Gabapentin , Cymbalta, Mobic, Hydrocodone  Injections:   03/19/2024: Left hip joint injection under ultrasound (no relief, Dr. Sharrie) 03/07/2024: Bilateral L4-5 transforaminal ESI (good relief x 2 weeks, Celestone  12 mg) 04/26/2023: Bilateral L4-5 transforaminal ESI (moderate to good relief x 2 weeks, then 50% relief, Celestone  12 mg) 03/09/2023: Left trochanteric bursa injection Durenda Doyne) 12/24/2022: Bilateral L4-5 transforaminal ESI (moderate to good relief, Celestone  12 mg) 08/31/2022: Bilateral L4-5 transforaminal ESI (moderate to good relief, dexamethasone  13 mg) 04/29/2022: Bilateral L4-5 transforaminla ESI (less effective, dexamethasone  13 mg) 04/08/2022: Bilateral L4 trigger point injections (temporary relief) 10/22/2021: Bilateral L4-5 transforaminal epidural injections (60% improvement, dexamethasone  13 mg) 06/02/2021: Right subacromial injection (good relief) 04/30/2021: Left L4-5 transforaminal ESI (55% relief, ODI: 29%, Celestone  12 mg) 10/28/2020: Bilateral L4-5 transforaminal ESI (mild relief of left sided pain,ODI: 36%, dexamethasone  13 mg) 09/04/2020: Right L5 trigger point injection He has [facility] an [facility] at [facility] around late June and states he had good relief but would like to say with our office. 05/22/2020: Bilateral S1 transforaminal ESI (ODI: 28%) 03/06/2020: Right L5-S1 and right S1 transforaminal ESI (mild relief) 07/31/2019: Bilateral S1 transforaminal ESI (moderate relief) 05/17/2019: Right L5-S1 and right S1 transforaminal ESI  04/02/2019: Right L5-S1 and right S1 transforaminal ESI  11/21/2018: Right L5-S1 and right S1 transforaminal ESI  09/22/2018: Right hip injection (good relief) 07/27/2018: Right L5-S1 and  right S1 transforaminal ESI (moderate relief) 06/29/2018: Right hip injection (good relief) 05/24/2018: Right L5 trigger point injection (minimal relief) 04/05/2018: Right L5-S1 and right S1 transforaminal ESI (good relief x6 weeks) 01/26/2018: Right hip joint injection (moderate to good relief) 01/09/2018: Right L5 TPI 12/26/2017: Left L5 TPI (good relief) 12/06/2017: Left L5-S1 and right S1 transforaminal ESI (good relief of right leg pain, no help for left leg pain) 09/22/2017: Right L5-S1 and right S1 transforaminal ESI (moderate to good relief) 07/21/2017: Right L5-S1 and right S1 transforaminal ESI 04/12/2017: Right hip joint injection (good relief) 03/30/2017: Right L5-S1 and right S1 transforaminal ESI (at least 50% relief) 02/22/2017: Right L5-S1 and right S1 transforaminal ESI 06/23/2016: Right hip joint injection 10/04/2014: Bilateral S1 transforaminal ESI (3 days of relief) 12/06/2013: Right L5 trigger-point injection (3 days of relief)  03/01/2012: Right L5-S1 and left S1 transforaminal ESI (4 days of relief)    Past Surgery: no spine surgery    The symptoms are causing a significant impact on the patient's life.   Exam: No exam done as this was a telephone encounter.     Imaging: None  Assessment and Plan: [name] has constant LBP with bilateral posterior leg pain to his feet. LBP < leg pain. He has numbness, tingling, and weakness in both legs.    He has known mild central stenosis L1-L2 and L3-L4, moderate central stenosis L2-L3, and moderate/severe central stenosis L4-L5. He has multilevel foraminal stenosis with severe right foraminal stenosis L5-S1.    Previous EMG of lower extremities 02/28/23 showed generalized sensory polyneuropathy in bilateral lower extremities.    Treatment options discussed with patient and following plan made:  - Reviewed with Dr. Clois. He would likely recommend a similar procedure that he discussed previously (L4-L5  decompression). He wants to see patient back to discuss further after he has finished PT and his BMI is < 40 (goal weight of 285). - We discussed above recommendations.  - PT orders to [facility] for lumbar spine.  - Okay to proceed with repeat lumbar ESI.  - Continue with medications from [facility].  - Follow up with me in 8 weeks to regroup.   Glade Boys PA-C Neurosurgery

## 2024-10-22 ENCOUNTER — Other Ambulatory Visit (HOSPITAL_COMMUNITY): Payer: Self-pay

## 2024-10-24 ENCOUNTER — Other Ambulatory Visit (HOSPITAL_COMMUNITY): Payer: Self-pay

## 2024-10-26 ENCOUNTER — Other Ambulatory Visit (HOSPITAL_COMMUNITY): Payer: Self-pay

## 2024-10-26 ENCOUNTER — Other Ambulatory Visit: Payer: Self-pay

## 2024-10-26 DIAGNOSIS — M545 Low back pain, unspecified: Secondary | ICD-10-CM | POA: Diagnosis not present

## 2024-10-29 ENCOUNTER — Telehealth: Payer: Self-pay | Admitting: Cardiovascular Disease

## 2024-10-29 NOTE — Telephone Encounter (Signed)
 Pt c/o BP issue: STAT if pt c/o blurred vision, one-sided weakness or slurred speech.  STAT if BP is GREATER than 180/120 TODAY.  STAT if BP is LESS than 90/60 and SYMPTOMATIC TODAY  1. What is your BP concern? Hypertension   2. Have you taken any BP medication today? yes  3. What are your last 5 BP readings? no   4. Are you having any other symptoms (ex. Dizziness, headache, blurred vision, passed out)? No    Pt c/o swelling/edema: STAT if pt has developed SOB within 24 hours  If swelling, where is the swelling located? Both feet   How much weight have you gained and in what time span? no  Have you gained 2 pounds in a day or 5 pounds in a week? no  Do you have a log of your daily weights (if so, list)? no  Are you currently taking a fluid pill? no  Are you currently SOB? No   Have you traveled recently in a car or plane for an extended period of time?  No

## 2024-10-29 NOTE — Telephone Encounter (Signed)
 Returned call to patient; Patient Identification: Verified with two identifiers  Patient reports increased swelling in both feet. States he elevates his feet when sitting most of the time. When asked about use of his "fluid pill," patient stated he was not taking one. Education provided that Torsemide  10 mg is prescribed to treat fluid retention. Patient verbalized understanding and stated he takes this medication daily but had not realized its purpose.  Patient denies any increase in weight; reports weight loss from 309 lbs to 302 lbs. Denies increased shortness of breath.  Patient reports experiencing dizziness when moving from sitting to standing, stating that his blood pressure "drops." He was unable to provide current blood pressure readings. Patient noted dizziness occurred during physical therapy last week.  Patient inquired whether blood pressure medications might need adjustment.  Advised patient that his concerns will be forwarded to [name] for review and recommendations. Informed patient that someone from our office will call him with follow-up instructions.  Patient verbalized understanding and thanked staff for returning his call.

## 2024-10-31 DIAGNOSIS — M545 Low back pain, unspecified: Secondary | ICD-10-CM | POA: Diagnosis not present

## 2024-11-02 ENCOUNTER — Other Ambulatory Visit: Payer: Self-pay

## 2024-11-02 ENCOUNTER — Other Ambulatory Visit: Payer: Self-pay | Admitting: Cardiovascular Disease

## 2024-11-02 ENCOUNTER — Other Ambulatory Visit (HOSPITAL_COMMUNITY): Payer: Self-pay

## 2024-11-02 MED ORDER — BENAZEPRIL HCL 10 MG PO TABS
10.0000 mg | ORAL_TABLET | Freq: Two times a day (BID) | ORAL | 3 refills | Status: AC
  Filled 2024-11-02: qty 180, 90d supply, fill #0
  Filled 2025-01-23: qty 180, 90d supply, fill #1

## 2024-11-02 MED ORDER — TORSEMIDE 20 MG PO TABS
20.0000 mg | ORAL_TABLET | Freq: Every day | ORAL | 3 refills | Status: AC
Start: 2024-11-02 — End: ?
  Filled 2024-11-02: qty 90, 90d supply, fill #0

## 2024-11-02 NOTE — Telephone Encounter (Signed)
 Called and spoke with patient. Notified him of the following recommendations from [name].  Would recommend we stop the amlodipine  Increase torsemide  up to 20 mg daily Increase benazepril  up to 10 mg twice daily Moderate salt and fluid intake Leg elevation when sitting If leg swelling persists, may need to consider compression hose Thx TGollan  Patient verbalized understanding. Prescriptions sent to preferred pharmacy.

## 2024-11-02 NOTE — Telephone Encounter (Signed)
 Pt calling back stating his his feet are still swollen, please advise of recommendations.

## 2024-11-06 DIAGNOSIS — M545 Low back pain, unspecified: Secondary | ICD-10-CM | POA: Diagnosis not present

## 2024-11-06 DIAGNOSIS — G894 Chronic pain syndrome: Secondary | ICD-10-CM | POA: Diagnosis not present

## 2024-11-06 DIAGNOSIS — M5416 Radiculopathy, lumbar region: Secondary | ICD-10-CM | POA: Diagnosis not present

## 2024-11-08 DIAGNOSIS — M545 Low back pain, unspecified: Secondary | ICD-10-CM | POA: Diagnosis not present

## 2024-11-13 DIAGNOSIS — M545 Low back pain, unspecified: Secondary | ICD-10-CM | POA: Diagnosis not present

## 2024-11-27 DIAGNOSIS — M1612 Unilateral primary osteoarthritis, left hip: Secondary | ICD-10-CM | POA: Diagnosis not present

## 2024-11-29 ENCOUNTER — Other Ambulatory Visit (HOSPITAL_COMMUNITY): Payer: Self-pay

## 2024-12-03 ENCOUNTER — Other Ambulatory Visit (HOSPITAL_COMMUNITY): Payer: Self-pay

## 2024-12-04 NOTE — Progress Notes (Unsigned)
 Referring Physician:  Jeffie Cheryl BRAVO, MD [address] [place],  [place] [address]  Primary Physician:  Jeffie Cheryl BRAVO, MD  History of Present Illness: Mr. Stefanos Haynesworth has a history of HTN, PE x 2, afib, OSA, GI bleed, polyneuropathy, chronic pain, anxiety, hyperlipidemia, obesity.   Has seen Dr. Unice and surgery was not recommended. Has seen Dr. Avanell and Dr. Naveira. Dr. Clois saw him back in 2020 and discussed surgery (L4-L5 decompression) with him. Patient declined.   He sees [facility].   He did phone visit with him on 10/19/24 to review his lumbar MRI scan. He has known mild central stenosis L1-L2 and L3-L4, moderate central stenosis L2-L3, and moderate/severe central stenosis L4-L5. He has multilevel foraminal stenosis with severe right foraminal stenosis L5-S1.    Previous EMG of lower extremities 02/28/23 showed generalized sensory polyneuropathy in bilateral lower extremities.   Dr. Clois reviewed his imaging and likely would recommend similar procedure that he discussed previously (L4-L5 decompression). He wanted to see patient back to discuss further after he has finished PT and his BMI is < 40 (goal weight of 285).   He had left L5-S1 TF and left S1 TF ESI with Dr. Avanell on 11/29/24. [facility]- [facility] due to pain.   He is here for follow up.   Since the [facility], he has no LBP and no posterior leg pain! Since PT, he started with more constant left lateral hip pain that is worse with walking. He has trouble laying on left side.   He is scheduled for left THA with [facility] in February.    He is on ELIQUIS .    Tobacco use: smokes 2 cigarettes per day x 55 years.    Bowel/Bladder Dysfunction: none   Conservative measures:  Physical therapy:  [facility]- did 4-5 visits and stopped due to pain.  Multimodal medical therapy including regular antiinflammatories:  Prednisone, Tylenol , Gabapentin , Cymbalta, Mobic, Hydrocodone  Injections:    11/29/2024: Left L5-S1 and left S1 transforaminal ESI  03/19/2024: Left hip joint injection under ultrasound (no relief, Dr. Sharrie) 03/07/2024: Bilateral L4-5 transforaminal ESI (good relief x 2 weeks, Celestone  12 mg) 04/26/2023: Bilateral L4-5 transforaminal ESI (moderate to good relief x 2 weeks, then 50% relief, Celestone  12 mg) 03/09/2023: Left trochanteric bursa injection Durenda Doyne) 12/24/2022: Bilateral L4-5 transforaminal ESI (moderate to good relief, Celestone  12 mg) 08/31/2022: Bilateral L4-5 transforaminal ESI (moderate to good relief, dexamethasone  13 mg) 04/29/2022: Bilateral L4-5 transforaminla ESI (less effective, dexamethasone  13 mg) 04/08/2022: Bilateral L4 trigger point injections (temporary relief) 10/22/2021: Bilateral L4-5 transforaminal epidural injections (60% improvement, dexamethasone  13 mg) 06/02/2021: Right subacromial injection (good relief) 04/30/2021: Left L4-5 transforaminal ESI (55% relief, ODI: 29%, Celestone  12 mg) 10/28/2020: Bilateral L4-5 transforaminal ESI (mild relief of left sided pain,ODI: 36%, dexamethasone  13 mg) 09/04/2020: Right L5 trigger point injection He has had an [facility] at [facility] around late June and states he had good relief but would like to say with our office. 05/22/2020: Bilateral S1 transforaminal ESI (ODI: 28%) 03/06/2020: Right L5-S1 and right S1 transforaminal ESI (mild relief) 07/31/2019: Bilateral S1 transforaminal ESI (moderate relief) 05/17/2019: Right L5-S1 and right S1 transforaminal ESI  04/02/2019: Right L5-S1 and right S1 transforaminal ESI  11/21/2018: Right L5-S1 and right S1 transforaminal ESI  09/22/2018: Right hip injection (good relief) 07/27/2018: Right L5-S1 and right S1 transforaminal ESI (moderate relief) 06/29/2018: Right hip injection (good relief) 05/24/2018: Right L5 trigger point injection (minimal relief) 04/05/2018: Right L5-S1  and right S1 transforaminal ESI (good relief x6 weeks) 01/26/2018:  Right hip joint injection (moderate to good relief) 01/09/2018: Right L5 TPI 12/26/2017: Left L5 TPI (good relief) 12/06/2017: Left L5-S1 and right S1 transforaminal ESI (good relief of right leg pain, no help for left leg pain) 09/22/2017: Right L5-S1 and right S1 transforaminal ESI (moderate to good relief) 07/21/2017: Right L5-S1 and right S1 transforaminal ESI 04/12/2017: Right hip joint injection (good relief) 03/30/2017: Right L5-S1 and right S1 transforaminal ESI (at least 50% relief) 02/22/2017: Right L5-S1 and right S1 transforaminal ESI 06/23/2016: Right hip joint injection 10/04/2014: Bilateral S1 transforaminal ESI (3 days of relief) 12/06/2013: Right L5 trigger-point injection (3 days of relief)  03/01/2012: Right L5-S1 and left S1 transforaminal ESI (4 days of relief)    Past Surgery: no spine surgery     The symptoms are causing a significant impact on the patient's life.     Review of Systems:  A 10 point review of systems is negative, except for the pertinent positives and negatives detailed in the HPI.  Past Medical History: Past Medical History:  Diagnosis Date   Anxiety    Arthritis    rheumatoid   Atypical mole 10/22/2015   R mid back paraspinal/mod   Basal cell carcinoma of skin 01/29/2010   L nasolabial fold   Basal cell carcinoma of skin 04/22/2015   L ear sup to tragus/excision   Basal cell carcinoma of skin 11/25/2015   R tragus/excision   BCC (basal cell carcinoma of skin) 02/23/2024   Left nasal ala - ED&C done   BCC (basal cell carcinoma of skin) 02/22/2024   left nasal ala - ED&C done   Colon polyps    Depression    GERD (gastroesophageal reflux disease)    Gout    Hemorrhoids    History of basal cell carcinoma (BCC) 05/15/2020    right lower lip/chin   Hypertension    Lumbar radiculopathy 09/25/2014   Neuropathy    Osteoarthritis    Pneumonia    Pulmonary embolism (HCC) 05/25/2014   Overview:  Times 2 on anticoagulation a.  Times  two.   b.  Filter placed.    Last Assessment & Plan:  Times 2 on anticoagulation a.  Times two.   b.  Filter placed. No current bleeding noted.     Reflux    Sleep apnea    Umbilical hernia     Past Surgical History: Past Surgical History:  Procedure Laterality Date   CARDIOVERSION N/A 03/18/2021   Procedure: CARDIOVERSION;  Surgeon: Hester Wolm PARAS, MD;  Location: [facility] ORS;  Service: Cardiovascular;  Laterality: N/A;   CARDIOVERSION N/A 09/13/2023   Procedure: CARDIOVERSION;  Surgeon: Florencio Cara BIRCH, MD;  Location: [facility] ORS;  Service: Cardiovascular;  Laterality: N/A;   COLONOSCOPY     ESOPHAGOGASTRODUODENOSCOPY     HERNIA REPAIR     umbilical   IVC FILTER INSERTION  04/15/2014   Bard Denali IVC filter by Dr. Marea   TOTAL HIP ARTHROPLASTY Right 01/02/2019   Procedure: TOTAL HIP ARTHROPLASTY ANTERIOR APPROACH;  Surgeon: Kathlynn Sharper, MD;  Location: [facility] ORS;  Service: Orthopedics;  Laterality: Right;    Allergies: Allergies as of 12/06/2024 - Review Complete 12/06/2024  Allergen Reaction Noted   Amiodarone Shortness Of Breath 04/16/2021   Oxycodone  Itching 05/10/2024   Shellfish allergy Swelling, Anaphylaxis, and Other (See Comments) 02/09/2016   Duloxetine Other (See Comments) 03/06/2015   Pseudoephedrine hcl Other (See Comments) 02/09/2016   Pseudoephedrine  Other (See Comments) 02/09/2016   Pseudoephedrine hcl Other (See Comments) 02/09/2016   Doxycycline  Rash 01/04/2024    Medications: Outpatient Encounter Medications as of 12/06/2024  Medication Sig   oxyCODONE -acetaminophen  (PERCOCET) 7.5-325 MG tablet Take 1 tablet by mouth.   allopurinol  (ZYLOPRIM ) 300 MG tablet Take 1 tablet (300 mg total) by mouth daily.   apixaban  (ELIQUIS ) 5 MG TABS tablet Take 1 tablet (5 mg total) by mouth 2 (two) times daily.   benazepril  (LOTENSIN ) 10 MG tablet Take 1 tablet (10 mg total) by mouth 2 (two) times daily.   bisacodyl  (DULCOLAX) 5 MG EC tablet Take 1 tablet (5 mg total) by  mouth daily as needed for moderate constipation.   carbamazepine (TEGRETOL) 100 MG chewable tablet Chew 100 mg by mouth at bedtime.   celecoxib  (CELEBREX ) 200 MG capsule Take 1 capsule (200 mg total) by mouth daily.   celecoxib  (CELEBREX ) 200 MG capsule Take 1 capsule (200 mg total) by mouth 2 (two) times daily.   Cholecalciferol (VITAMIN D3) 50 MCG (2000 UT) TABS Take 2,000 mg by mouth daily.   gabapentin  (NEURONTIN ) 300 MG capsule Take 1 capsule (300 mg total) by mouth 2 (two) times daily.   lidocaine  (LIDODERM ) 5 % SMARTSIG:Topical   metoprolol  tartrate (LOPRESSOR ) 25 MG tablet Take 1 tablet (25 mg total) by mouth 2 (two) times daily.   mometasone  (ELOCON ) 0.1 % cream Apply 1 Application topically as directed once daily up to 5 days a week to affected area(s) of rash on chest as needed for itchy flares   Multiple Vitamin (MULTIVITAMIN WITH MINERALS) TABS tablet Take 1 tablet by mouth daily. One-A-Day   naloxone (NARCAN) nasal spray 4 mg/0.1 mL Place 1 spray into the nose once.   pantoprazole  (PROTONIX ) 40 MG tablet Take 1 tablet (40 mg total) by mouth daily.   potassium chloride  (KLOR-CON ) 10 MEQ tablet Take 1 tablet (10 mEq total) by mouth daily.   Probiotic Product (PHILLIPS COLON HEALTH) CAPS Take 1 capsule by mouth daily.   silodosin  (RAPAFLO ) 8 MG CAPS capsule Take 1 capsule (8 mg total) by mouth daily with breakfast.   torsemide  (DEMADEX ) 20 MG tablet Take 1 tablet (20 mg total) by mouth daily.   venlafaxine  XR (EFFEXOR -XR) 75 MG 24 hr capsule Take 75 mg by mouth 2 (two) times daily.    venlafaxine  XR (EFFEXOR -XR) 75 MG 24 hr capsule Take 1 capsule (75 mg total) by mouth 2 (two) times daily.   venlafaxine  XR (EFFEXOR -XR) 75 MG 24 hr capsule Take 1 capsule (75 mg total) by mouth 2 (two) times daily.   vitamin B-12 (CYANOCOBALAMIN ) 1000 MCG tablet Take 1,000 mcg by mouth daily.   [DISCONTINUED] acetaminophen  (TYLENOL ) 500 MG tablet Take 1,000 mg by mouth daily.   [DISCONTINUED]  HYDROcodone -acetaminophen  (NORCO) 10-325 MG tablet Take 0.5-1 tablets by mouth 3 (three) times daily as needed.   No facility-administered encounter medications on file as of 12/06/2024.    Social History: Social History   Tobacco Use   Smoking status: Light Smoker    Current packs/day: 0.10    Average packs/day: 0.1 packs/day for 54.0 years (5.4 ttl pk-yrs)    Types: Cigarettes   Smokeless tobacco: Never   Tobacco comments:    Smokes 2-3 cigarettes daily   Vaping Use   Vaping status: Never Used  Substance Use Topics   Alcohol use: Not Currently    Alcohol/week: 0.0 standard drinks of alcohol   Drug use: No    Family Medical History: Family  History  Problem Relation Age of Onset   Diabetes Mother    Cancer Father     Physical Examination: Vitals:   12/06/24 1053  BP: 130/78      Awake, alert, oriented to person, place, and time.  Speech is clear and fluent. Fund of knowledge is appropriate.   Cranial Nerves: Pupils equal round and reactive to light.  Facial tone is symmetric.    No abnormal lesions on exposed skin.   Strength: Side Iliopsoas Quads Hamstring PF DF EHL  R 5 5 5 5 5 5   L 5 5 5 5 5 5    Reflexes are 2+ and symmetric at the patella and achilles.    Clonus is not present.   Bilateral lower extremity sensation is intact to light touch.     No pain with IR/ER of both hips.   Gait is slow.   Medical Decision Making  Imaging: none  Assessment and Plan: Mr. Obeso is doing well after his last ESI. He has no LBP or leg pain!   He has known mild central stenosis L1-L2 and L3-L4, moderate central stenosis L2-L3, and moderate/severe central stenosis L4-L5. He has multilevel foraminal stenosis with severe right foraminal stenosis L5-S1.    Previous EMG of lower extremities 02/28/23 showed generalized sensory polyneuropathy in bilateral lower extremities.   He is having more left hip pain. He is scheduled for left THA with Dr. Lorelle in February.     Treatment options discussed with patient and following plan made:  - Continue to follow with Dr. Avanell regarding repeat lumbar injections.  - Follow up with Dr. Lorelle for his left hip.  - We continue to recommend weight loss.  - Would avoid surgery for lumbar spine if able. If not, Dr. Clois would likely recommend a similar procedure that he discussed previously (L4-L5 decompression). He would need to do PT and BMI would have to be < 40 (goal weight of 285). - He will follow up with me prn. I will message him in February to check on him.       I spent a total of 20 minutes in face-to-face and non-face-to-face activities related to this patient's care today including review of outside records, review of imaging, review of symptoms, physical exam, discussion of differential diagnosis, discussion of treatment options, and documentation.   Glade Boys PA-C Dept. of Neurosurgery

## 2024-12-05 ENCOUNTER — Telehealth: Payer: Self-pay | Admitting: Cardiovascular Disease

## 2024-12-05 ENCOUNTER — Telehealth: Payer: Self-pay

## 2024-12-05 NOTE — Telephone Encounter (Signed)
 Called patient to confirm his upcoming appointment with Glade Boys, PA. Pt stated that he will be here and requested imaging of his hip because it's been bothering him pt was advised that I would leave a note in his chart and that during his appointment tomorrow he can discuss this issue with Stacy. Patient verbalized understanding.

## 2024-12-05 NOTE — Telephone Encounter (Signed)
° °  Pre-operative Risk Assessment    Patient Name: Adrian [name]  DOB: October 14, 1946 MRN: [id number]   Date of last office visit: 06/04/24 Date of next office visit: TBD   Request for Surgical Clearance    Procedure:  Left THA  Date of Surgery:  Clearance TBD                                Surgeon:  Dr. Lorelle Socks Group or Practice Name:  [facility] Phone number:  [phone] Fax number:  [phone]   Type of Clearance Requested:   - Medical    Type of Anesthesia:  Not Indicated   Additional requests/questions:    Bonney Audrene LOISE Agapito   12/05/2024, 4:41 PM

## 2024-12-06 ENCOUNTER — Encounter: Payer: Self-pay | Admitting: Orthopedic Surgery

## 2024-12-06 ENCOUNTER — Telehealth: Payer: Self-pay | Admitting: *Deleted

## 2024-12-06 ENCOUNTER — Ambulatory Visit: Admitting: Orthopedic Surgery

## 2024-12-06 VITALS — BP 130/78 | Wt 302.0 lb

## 2024-12-06 DIAGNOSIS — M48062 Spinal stenosis, lumbar region with neurogenic claudication: Secondary | ICD-10-CM | POA: Diagnosis not present

## 2024-12-06 DIAGNOSIS — M5416 Radiculopathy, lumbar region: Secondary | ICD-10-CM

## 2024-12-06 DIAGNOSIS — M25552 Pain in left hip: Secondary | ICD-10-CM | POA: Diagnosis not present

## 2024-12-06 DIAGNOSIS — M47816 Spondylosis without myelopathy or radiculopathy, lumbar region: Secondary | ICD-10-CM

## 2024-12-06 NOTE — Telephone Encounter (Signed)
 Called requesting office, receptionist will send a message to doctor and call us  back with an answer.

## 2024-12-06 NOTE — Telephone Encounter (Signed)
 Pre-op team,   Will you please clarify with requesting office if patient will need to hold Eliquis ?   Thank you!  DW

## 2024-12-06 NOTE — Telephone Encounter (Deleted)
°  Patient Consent for Virtual Visit         Adrian [name] has provided verbal consent on 12/06/2024 for a virtual visit (video or telephone).   CONSENT FOR VIRTUAL VISIT FOR:  Adrian [name]  By participating in this virtual visit I agree to the following:  I hereby voluntarily request, consent and authorize [facility] and its employed or contracted physicians, physician assistants, nurse practitioners or other licensed health care professionals (the Practitioner), to provide me with telemedicine health care services (the Services) as deemed necessary by the treating Practitioner. I acknowledge and consent to receive the Services by the Practitioner via telemedicine. I understand that the telemedicine visit will involve communicating with the Practitioner through live audiovisual communication technology and the disclosure of certain medical information by electronic transmission. I acknowledge that I have been given the opportunity to request an in-person assessment or other available alternative prior to the telemedicine visit and am voluntarily participating in the telemedicine visit.  I understand that I have the right to withhold or withdraw my consent to the use of telemedicine in the course of my care at any time, without affecting my right to future care or treatment, and that the Practitioner or I may terminate the telemedicine visit at any time. I understand that I have the right to inspect all information obtained and/or recorded in the course of the telemedicine visit and may receive copies of available information for a reasonable fee.  I understand that some of the potential risks of receiving the Services via telemedicine include:  Delay or interruption in medical evaluation due to technological equipment failure or disruption; Information transmitted may not be sufficient (e.g. poor resolution of images) to allow for appropriate medical decision making by the Practitioner;  and/or  In rare instances, security protocols could fail, causing a breach of personal health information.  Furthermore, I acknowledge that it is my responsibility to provide information about my medical history, conditions and care that is complete and accurate to the best of my ability. I acknowledge that Practitioner's advice, recommendations, and/or decision may be based on factors not within their control, such as incomplete or inaccurate data provided by me or distortions of diagnostic images or specimens that may result from electronic transmissions. I understand that the practice of medicine is not an exact science and that Practitioner makes no warranties or guarantees regarding treatment outcomes. I acknowledge that a copy of this consent can be made available to me via my patient portal [facility] MyChart), or I can request a printed copy by calling the office of [facility].    I understand that my insurance will be billed for this visit.   I have read or had this consent read to me. I understand the contents of this consent, which adequately explains the benefits and risks of the Services being provided via telemedicine.  I have been provided ample opportunity to ask questions regarding this consent and the Services and have had my questions answered to my satisfaction. I give my informed consent for the services to be provided through the use of telemedicine in my medical care

## 2024-12-06 NOTE — Telephone Encounter (Signed)
 Patient with patient this morning, He states he has a dry ejaculation  and his testicle area is itchy. He states it is coming from the silodosin  . Please advise if he can change to a another medication

## 2024-12-06 NOTE — Telephone Encounter (Signed)
 error

## 2024-12-07 NOTE — Telephone Encounter (Signed)
 Pt states there is another medication he remembers talking about with the doctors. Pt was informed I will look into it and talk with Dr.MacDiarmid about it and I'll get back in touch with him on Monday.

## 2024-12-07 NOTE — Telephone Encounter (Signed)
 Per requesting office they need hold instructions for Eliquis .

## 2024-12-10 NOTE — Telephone Encounter (Signed)
 Notified patient as instructed, patient will stay on  silodosin  .  He had the same problem with Flomax . There is not another medication for him to take. This is a quality-of-life decision. He can decide if he wants to stay on the Rapaflo . I do not have another medication to replace it. I would recommend to stay on it and to try to except the dry ejaculation

## 2024-12-11 ENCOUNTER — Telehealth: Payer: Self-pay | Admitting: *Deleted

## 2024-12-11 NOTE — Telephone Encounter (Signed)
"  °  Patient Consent for Virtual Visit        Adrian [name] has provided verbal consent on 12/11/2024 for a virtual visit (video or telephone).   CONSENT FOR VIRTUAL VISIT FOR:  Adrian [name]  By participating in this virtual visit I agree to the following:  I hereby voluntarily request, consent and authorize [facility] and its employed or contracted physicians, physician assistants, nurse practitioners or other licensed health care professionals (the Practitioner), to provide me with telemedicine health care services (the Services) as deemed necessary by the treating Practitioner. I acknowledge and consent to receive the Services by the Practitioner via telemedicine. I understand that the telemedicine visit will involve communicating with the Practitioner through live audiovisual communication technology and the disclosure of certain medical information by electronic transmission. I acknowledge that I have been given the opportunity to request an in-person assessment or other available alternative prior to the telemedicine visit and am voluntarily participating in the telemedicine visit.  I understand that I have the right to withhold or withdraw my consent to the use of telemedicine in the course of my care at any time, without affecting my right to future care or treatment, and that the Practitioner or I may terminate the telemedicine visit at any time. I understand that I have the right to inspect all information obtained and/or recorded in the course of the telemedicine visit and may receive copies of available information for a reasonable fee.  I understand that some of the potential risks of receiving the Services via telemedicine include:  Delay or interruption in medical evaluation due to technological equipment failure or disruption; Information transmitted may not be sufficient (e.g. poor resolution of images) to allow for appropriate medical decision making by the Practitioner;  and/or  In rare instances, security protocols could fail, causing a breach of personal health information.  Furthermore, I acknowledge that it is my responsibility to provide information about my medical history, conditions and care that is complete and accurate to the best of my ability. I acknowledge that Practitioner's advice, recommendations, and/or decision may be based on factors not within their control, such as incomplete or inaccurate data provided by me or distortions of diagnostic images or specimens that may result from electronic transmissions. I understand that the practice of medicine is not an exact science and that Practitioner makes no warranties or guarantees regarding treatment outcomes. I acknowledge that a copy of this consent can be made available to me via my patient portal [facility] MyChart), or I can request a printed copy by calling the office of [facility].    I understand that my insurance will be billed for this visit.   I have read or had this consent read to me. I understand the contents of this consent, which adequately explains the benefits and risks of the Services being provided via telemedicine.  I have been provided ample opportunity to ask questions regarding this consent and the Services and have had my questions answered to my satisfaction. I give my informed consent for the services to be provided through the use of telemedicine in my medical care    "

## 2024-12-11 NOTE — Telephone Encounter (Signed)
 Preop tele appt scheduled, med rec and consent done.

## 2024-12-11 NOTE — Telephone Encounter (Signed)
 Patient with diagnosis of afib and PE (05/2014) on Eliquis  for anticoagulation.    Procedure:  Left THA   Date of Surgery:  Clearance TBD   CHA2DS2-VASc Score = 4   This indicates a 4.8% annual risk of stroke. The patient's score is based upon: CHF History: 0 HTN History: 1 Diabetes History: 1 Stroke History: 0 Vascular Disease History: 0 Age Score: 2 Gender Score: 0    CrCl 81 ml/min Platelet count 336 K  Patient has not had an Afib/aflutter ablation in the last 3 months, DCCV within the last 4 weeks or a watchman implanted in the last 45 days   Per office protocol, patient can hold Eliquis  for 3 days prior to procedure.    Patient will not need bridging with Lovenox  (enoxaparin ) around procedure.  **This guidance is not considered finalized until pre-operative APP has relayed final recommendations.**

## 2024-12-11 NOTE — Telephone Encounter (Signed)
" ° °  Name: Adrian [name]  DOB: [birth year]  MRN: [id number]  Primary Cardiologist: None   Preoperative team, please contact this patient and set up a phone call appointment for further preoperative risk assessment. Please obtain consent and complete medication review. Thank you for your help.  I confirm that guidance regarding antiplatelet and oral anticoagulation therapy has been completed and, if necessary, noted below.  Per Pharm D, patient has not had an Afib/aflutter ablation within the last 3 months, DCCV within the last 4 weeks, or Watchman in the last 45 days. Patient may hold Eliquis  for 3 days prior to procedure.  Patient will not need bridging with Lovenox  around procedure.    I also confirmed the patient resides in the state of [place] . As per [employer] Medical Board telemedicine laws, the patient must reside in the state in which the provider is licensed.    Barnie Hila, NP 12/11/2024, 12:57 PM [facility]     "

## 2024-12-16 ENCOUNTER — Other Ambulatory Visit: Payer: Self-pay | Admitting: Cardiovascular Disease

## 2024-12-16 DIAGNOSIS — I4819 Other persistent atrial fibrillation: Secondary | ICD-10-CM

## 2024-12-17 ENCOUNTER — Other Ambulatory Visit: Payer: Self-pay

## 2024-12-17 ENCOUNTER — Other Ambulatory Visit (HOSPITAL_COMMUNITY): Payer: Self-pay

## 2024-12-17 MED ORDER — POTASSIUM CHLORIDE ER 10 MEQ PO TBCR
10.0000 meq | EXTENDED_RELEASE_TABLET | Freq: Every day | ORAL | 3 refills | Status: AC
  Filled 2024-12-17: qty 90, 90d supply, fill #0

## 2024-12-17 MED ORDER — METOPROLOL TARTRATE 25 MG PO TABS
25.0000 mg | ORAL_TABLET | Freq: Two times a day (BID) | ORAL | 3 refills | Status: AC
  Filled 2024-12-17: qty 180, 90d supply, fill #0

## 2024-12-26 ENCOUNTER — Ambulatory Visit: Attending: Cardiovascular Disease | Admitting: Nurse Practitioner

## 2024-12-26 DIAGNOSIS — Z01810 Encounter for preprocedural cardiovascular examination: Secondary | ICD-10-CM

## 2024-12-26 NOTE — Progress Notes (Signed)
 "   Virtual Visit via Telephone Note   Because of Adrian [name] co-morbid illnesses, he is at least at moderate risk for complications without adequate follow up.  This format is felt to be most appropriate for this patient at this time.  Due to technical limitations with video connection web designer), today's appointment will be conducted as an audio only telehealth visit, and Adrian [name] verbally agreed to proceed in this manner.   All issues noted in this document were discussed and addressed.  No physical exam could be performed with this format.  Evaluation Performed:  Preoperative cardiovascular risk assessment _____________   Date:  12/26/2024   Patient ID:  Adrian [name], DOB 1946-10-15, MRN [id number] Patient Location:  Home Provider location:   Office  Primary Care Provider:  Jeffie Cheryl BRAVO, MD Primary Cardiologist:  None  Chief Complaint / Patient Profile   79 y.o. y/o male with a h/o paroxysmal atrial fibrillation, PE/DVT, hypertension, hyperlipidemia, rheumatoid arthritis, OSA, COPD, obesity, and GERD who is pending left total hip arthroplasty on 01/31/2025 with Dr. Conda of [facility] and presents today for telephonic preoperative cardiovascular risk assessment.  History of Present Illness    Adrian [name] is a 79 y.o. male who presents via audio/video conferencing for a telehealth visit today.  Pt was last seen in cardiology clinic on 06/04/2024 by [name].  At that time Adrian [name] was doing well. The patient is now pending procedure as outlined above. Since his last visit, he has done well from a cardiac standpoint.   He denies chest pain, palpitations, dyspnea, pnd, orthopnea, n, v, dizziness, syncope, edema, weight gain, or early satiety. All other systems reviewed and are otherwise negative except as noted above.   Past Medical History    Past Medical History:  Diagnosis Date   Anxiety    Arthritis    rheumatoid    Atypical mole 10/22/2015   R mid back paraspinal/mod   Basal cell carcinoma of skin 01/29/2010   L nasolabial fold   Basal cell carcinoma of skin 04/22/2015   L ear sup to tragus/excision   Basal cell carcinoma of skin 11/25/2015   R tragus/excision   BCC (basal cell carcinoma of skin) 02/23/2024   Left nasal ala - ED&C done   BCC (basal cell carcinoma of skin) 02/22/2024   left nasal ala - ED&C done   Colon polyps    Depression    GERD (gastroesophageal reflux disease)    Gout    Hemorrhoids    History of basal cell carcinoma (BCC) 05/15/2020    right lower lip/chin   Hypertension    Lumbar radiculopathy 09/25/2014   Neuropathy    Osteoarthritis    Pneumonia    Pulmonary embolism (HCC) 05/25/2014   Overview:  Times 2 on anticoagulation a.  Times two.   b.  Filter placed.    Last Assessment & Plan:  Times 2 on anticoagulation a.  Times two.   b.  Filter placed. No current bleeding noted.     Reflux    Sleep apnea    Umbilical hernia    Past Surgical History:  Procedure Laterality Date   CARDIOVERSION N/A 03/18/2021   Procedure: CARDIOVERSION;  Surgeon: Hester Wolm PARAS, MD;  Location: [facility] ORS;  Service: Cardiovascular;  Laterality: N/A;   CARDIOVERSION N/A 09/13/2023   Procedure: CARDIOVERSION;  Surgeon: Florencio Cara BIRCH, MD;  Location: [facility] ORS;  Service: Cardiovascular;  Laterality: N/A;  COLONOSCOPY     ESOPHAGOGASTRODUODENOSCOPY     HERNIA REPAIR     umbilical   IVC FILTER INSERTION  04/15/2014   Bard Denali IVC filter by Dr. Marea   TOTAL HIP ARTHROPLASTY Right 01/02/2019   Procedure: TOTAL HIP ARTHROPLASTY ANTERIOR APPROACH;  Surgeon: Kathlynn Sharper, MD;  Location: [facility] ORS;  Service: Orthopedics;  Laterality: Right;    Allergies  Allergies[1]  Home Medications    Prior to Admission medications  Medication Sig Start Date End Date Taking? Authorizing Provider  allopurinol  (ZYLOPRIM ) 300 MG tablet Take 1 tablet (300 mg total) by mouth daily. 10/08/24      apixaban  (ELIQUIS ) 5 MG TABS tablet Take 1 tablet (5 mg total) by mouth 2 (two) times daily. 01/27/24   Gollan, Timothy J, MD  benazepril  (LOTENSIN ) 10 MG tablet Take 1 tablet (10 mg total) by mouth 2 (two) times daily. 11/02/24   Gollan, Timothy J, MD  bisacodyl  (DULCOLAX) 5 MG EC tablet Take 1 tablet (5 mg total) by mouth daily as needed for moderate constipation. 01/05/19   Charlene Debby BROCKS, PA-C  carbamazepine (TEGRETOL) 100 MG chewable tablet Chew 100 mg by mouth at bedtime.    [provider]  celecoxib  (CELEBREX ) 200 MG capsule Take 1 capsule (200 mg total) by mouth daily. 07/03/24     celecoxib  (CELEBREX ) 200 MG capsule Take 1 capsule (200 mg total) by mouth 2 (two) times daily. 07/23/24     Cholecalciferol (VITAMIN D3) 50 MCG (2000 UT) TABS Take 2,000 mg by mouth daily.    [provider]  gabapentin  (NEURONTIN ) 300 MG capsule Take 1 capsule (300 mg total) by mouth 2 (two) times daily. 10/08/24     lidocaine  (LIDODERM ) 5 % SMARTSIG:Topical 08/23/24   [provider]  metoprolol  tartrate (LOPRESSOR ) 25 MG tablet Take 1 tablet (25 mg total) by mouth 2 (two) times daily. 12/17/24   Gollan, Timothy J, MD  mometasone  (ELOCON ) 0.1 % cream Apply 1 Application topically as directed once daily up to 5 days a week to affected area(s) of rash on chest as needed for itchy flares 09/06/24   Hester Alm BROCKS, MD  Multiple Vitamin (MULTIVITAMIN WITH MINERALS) TABS tablet Take 1 tablet by mouth daily. One-A-Day    [provider]  naloxone [facility]) nasal spray 4 mg/0.1 mL Place 1 spray into the nose once. 03/02/22   [provider]  oxyCODONE -acetaminophen  (PERCOCET) 7.5-325 MG tablet Take 1 tablet by mouth. 11/06/24   [provider]  pantoprazole  (PROTONIX ) 40 MG tablet Take 1 tablet (40 mg total) by mouth daily. 03/22/24     potassium chloride  (KLOR-CON ) 10 MEQ tablet Take 1 tablet (10 mEq total) by mouth daily. 12/17/24   Gollan, Timothy J, MD  Probiotic  Product [facility]) CAPS Take 1 capsule by mouth daily.    [provider]  silodosin  (RAPAFLO ) 8 MG CAPS capsule Take 1 capsule (8 mg total) by mouth daily with breakfast. 06/11/24   Gaston Hamilton, MD  torsemide  (DEMADEX ) 20 MG tablet Take 1 tablet (20 mg total) by mouth daily. 11/02/24   Gollan, Timothy J, MD  venlafaxine  XR (EFFEXOR -XR) 75 MG 24 hr capsule Take 75 mg by mouth 2 (two) times daily.  08/16/18   [provider]  venlafaxine  XR (EFFEXOR -XR) 75 MG 24 hr capsule Take 1 capsule (75 mg total) by mouth 2 (two) times daily. 09/10/24     venlafaxine  XR (EFFEXOR -XR) 75 MG 24 hr capsule Take 1 capsule (75 mg  total) by mouth 2 (two) times daily. 09/10/24     vitamin B-12 (CYANOCOBALAMIN ) 1000 MCG tablet Take 1,000 mcg by mouth daily.    [provider]    Physical Exam    Vital Signs:  Adrian [name] does not have vital signs available for review today.  Given telephonic nature of communication, physical exam is limited. AAOx3. NAD. Normal affect.  Speech and respirations are unlabored.  Accessory Clinical Findings    None  Assessment & Plan    1.  Preoperative Cardiovascular Risk Assessment:  According to the Revised Cardiac Risk Index (RCRI), his Perioperative Risk of Major Cardiac Event is (%): 0.4. His Functional Capacity in METs is: 5.72 according to the Duke Activity Status Index (DASI). Therefore, based on ACC/AHA guidelines, patient would be at acceptable risk for the planned procedure without further cardiovascular testing.   The patient was advised that if he develops new symptoms prior to surgery to contact our office to arrange for a follow-up visit, and he verbalized understanding.  Per office protocol, patient can hold Eliquis  for 3 days prior to procedure. Please resume Eliquis  as soon as possible postprocedure, at the discretion of the surgeon.   A copy of this note will be routed to requesting surgeon.  Time:   Today, I have  spent 5 minutes with the patient with telehealth technology discussing medical history, symptoms, and management plan.     Adrian JAYSON Braver, NP  12/26/2024, 9:45 AM     [1]  Allergies Allergen Reactions   Amiodarone Shortness Of Breath   Oxycodone  Itching   Shellfish Allergy Swelling, Anaphylaxis and Other (See Comments)    Adrian [name]  Uncoded Allergy. Allergen: seafood, Other Reaction: Adrian [name]     Adrian [name]   Duloxetine Other (See Comments)    Hallucination  Hallucination    Hallucination Hallucination  Hallucination  Hallucination   Pseudoephedrine Hcl Other (See Comments)    Other Reaction: ELEVATED BLOOD PRESSURE    ELEVATED BLOOD PRESSURE    ELEVATED BLOOD PRESSURE Other Reaction: ELEVATED BLOOD PRESSURE ELEVATED BLOOD PRESSURE  ELEVATED BLOOD PRESSURE  Other Reaction: ELEVATED BLOOD PRESSURE  ELEVATED BLOOD PRESSURE   Pseudoephedrine Other (See Comments)   Pseudoephedrine Hcl Other (See Comments)    ELEVATED BLOOD PRESSURE   Doxycycline  Rash   "

## 2024-12-30 ENCOUNTER — Other Ambulatory Visit: Payer: Self-pay

## 2024-12-31 ENCOUNTER — Other Ambulatory Visit (HOSPITAL_COMMUNITY): Payer: Self-pay

## 2024-12-31 ENCOUNTER — Other Ambulatory Visit: Payer: Self-pay

## 2025-01-01 ENCOUNTER — Telehealth: Payer: Self-pay | Admitting: Urology

## 2025-01-01 DIAGNOSIS — N393 Stress incontinence (female) (male): Secondary | ICD-10-CM

## 2025-01-01 DIAGNOSIS — R3912 Poor urinary stream: Secondary | ICD-10-CM

## 2025-01-01 NOTE — Telephone Encounter (Signed)
 Pt called and requested if they could be prescribed FLOMAX  instead of silodosin . Pt would like a call back with clarity.

## 2025-01-04 ENCOUNTER — Other Ambulatory Visit (HOSPITAL_COMMUNITY): Payer: Self-pay

## 2025-01-04 ENCOUNTER — Other Ambulatory Visit: Payer: Self-pay

## 2025-01-04 MED ORDER — TAMSULOSIN HCL 0.4 MG PO CAPS
0.4000 mg | ORAL_CAPSULE | Freq: Every day | ORAL | 11 refills | Status: AC
  Filled 2025-01-04: qty 30, 30d supply, fill #0
  Filled ????-??-??: fill #1

## 2025-01-04 NOTE — Telephone Encounter (Signed)
 Pt informed. Medication sent

## 2025-01-09 ENCOUNTER — Other Ambulatory Visit: Payer: Self-pay | Admitting: Orthopedic Surgery

## 2025-01-10 ENCOUNTER — Other Ambulatory Visit (HOSPITAL_COMMUNITY): Payer: Self-pay

## 2025-01-10 ENCOUNTER — Telehealth: Payer: Self-pay | Admitting: Cardiovascular Disease

## 2025-01-10 MED ORDER — PANTOPRAZOLE SODIUM 40 MG PO TBEC
40.0000 mg | DELAYED_RELEASE_TABLET | Freq: Every day | ORAL | 1 refills | Status: AC
  Filled 2025-01-10: qty 90, 90d supply, fill #0

## 2025-01-10 NOTE — Telephone Encounter (Signed)
 Pre-op evaluation performed via telephone visit on 12/26/2024 by [name], NP. Visit note re-routed to requesting office.   Will remove this request from pre-op pool.   Adrian [name]. Katharina Jehle, DNP, NP-C  01/10/2025, 4:22 PM [facility] [address] Office [phone] Fax [phone]

## 2025-01-10 NOTE — Telephone Encounter (Signed)
"  ° °  Pre-operative Risk Assessment    Patient Name: Adrian [name]  DOB: March 05, 1946 MRN: [id number]   Date of last office visit: 06/04/2024 Date of next office visit: n/a   Request for Surgical Clearance    Procedure:  Left THA  Date of Surgery:  Clearance 01/31/25                                Surgeon:  Lorelle Surgeon's Group or Practice Name:  [facility] Phone number:  [phone] Fax number:  [phone]   Type of Clearance Requested:   - Medical    Type of Anesthesia:  Not Indicated   Additional requests/questions:    SignedTinnie KATHEE Schools   01/10/2025, 3:42 PM   "

## 2025-01-11 ENCOUNTER — Other Ambulatory Visit: Payer: Self-pay

## 2025-01-15 ENCOUNTER — Other Ambulatory Visit: Payer: Self-pay | Admitting: Cardiovascular Disease

## 2025-01-15 ENCOUNTER — Other Ambulatory Visit (HOSPITAL_COMMUNITY): Payer: Self-pay

## 2025-01-15 ENCOUNTER — Encounter: Payer: Self-pay | Admitting: Pharmacist

## 2025-01-15 ENCOUNTER — Other Ambulatory Visit: Payer: Self-pay

## 2025-01-15 DIAGNOSIS — I4819 Other persistent atrial fibrillation: Secondary | ICD-10-CM

## 2025-01-15 MED ORDER — APIXABAN 5 MG PO TABS
5.0000 mg | ORAL_TABLET | Freq: Two times a day (BID) | ORAL | 2 refills | Status: AC
  Filled 2025-01-15: qty 180, 90d supply, fill #0
  Filled 2025-01-15: qty 60, 30d supply, fill #0

## 2025-01-15 NOTE — Telephone Encounter (Signed)
 Eliquis  5mg  refill request received. Patient is 79 years old, weight-137kg, Crea-1.1 on 08/28/24 via Care Everywhere from [place], [place], DVT/PE, and last seen by [name] via Tele-Visit on 12/26/24 and 06/04/24 with [name]. Dose is appropriate based on dosing criteria. Will send in refill to requested pharmacy.    Advised per pharmd to continue eliquis  since tegretol is not new medication

## 2025-01-16 ENCOUNTER — Other Ambulatory Visit: Payer: Self-pay

## 2025-01-23 ENCOUNTER — Other Ambulatory Visit (HOSPITAL_COMMUNITY): Payer: Self-pay

## 2025-01-24 ENCOUNTER — Other Ambulatory Visit: Payer: Self-pay

## 2025-01-24 ENCOUNTER — Inpatient Hospital Stay
Admission: RE | Admit: 2025-01-24 | Discharge: 2025-01-24 | Disposition: A | Source: Ambulatory Visit | Attending: Orthopedic Surgery

## 2025-01-24 VITALS — BP 126/76 | HR 91 | Resp 18 | Ht 71.0 in | Wt 296.7 lb

## 2025-01-24 DIAGNOSIS — Z01812 Encounter for preprocedural laboratory examination: Secondary | ICD-10-CM

## 2025-01-24 HISTORY — DX: Cardiac arrhythmia, unspecified: I49.9

## 2025-01-24 LAB — CBC WITH DIFFERENTIAL/PLATELET
Abs Immature Granulocytes: 0.1 10*3/uL — ABNORMAL HIGH (ref 0.00–0.07)
Basophils Absolute: 0.1 10*3/uL (ref 0.0–0.1)
Basophils Relative: 1 %
Eosinophils Absolute: 0.3 10*3/uL (ref 0.0–0.5)
Eosinophils Relative: 3 %
HCT: 36.8 % — ABNORMAL LOW (ref 39.0–52.0)
Hemoglobin: 12.3 g/dL — ABNORMAL LOW (ref 13.0–17.0)
Immature Granulocytes: 1 %
Lymphocytes Relative: 29 %
Lymphs Abs: 2.5 10*3/uL (ref 0.7–4.0)
MCH: 29.1 pg (ref 26.0–34.0)
MCHC: 33.4 g/dL (ref 30.0–36.0)
MCV: 87 fL (ref 80.0–100.0)
Monocytes Absolute: 0.5 10*3/uL (ref 0.1–1.0)
Monocytes Relative: 6 %
Neutro Abs: 5.2 10*3/uL (ref 1.7–7.7)
Neutrophils Relative %: 60 %
Platelets: 250 10*3/uL (ref 150–400)
RBC: 4.23 MIL/uL (ref 4.22–5.81)
RDW: 15.6 % — ABNORMAL HIGH (ref 11.5–15.5)
WBC: 8.6 10*3/uL (ref 4.0–10.5)
nRBC: 0 % (ref 0.0–0.2)

## 2025-01-24 LAB — URINALYSIS, ROUTINE W REFLEX MICROSCOPIC
Bilirubin Urine: NEGATIVE
Glucose, UA: NEGATIVE mg/dL
Hgb urine dipstick: NEGATIVE
Ketones, ur: NEGATIVE mg/dL
Leukocytes,Ua: NEGATIVE
Nitrite: NEGATIVE
Protein, ur: NEGATIVE mg/dL
Specific Gravity, Urine: 1.012 (ref 1.005–1.030)
pH: 5 (ref 5.0–8.0)

## 2025-01-24 LAB — SURGICAL PCR SCREEN
MRSA, PCR: NEGATIVE
Staphylococcus aureus: NEGATIVE

## 2025-01-24 LAB — TYPE AND SCREEN
ABO/RH(D): AB POS
Antibody Screen: NEGATIVE

## 2025-01-24 LAB — COMPREHENSIVE METABOLIC PANEL WITH GFR
ALT: 13 U/L (ref 0–44)
AST: 21 U/L (ref 15–41)
Albumin: 4.3 g/dL (ref 3.5–5.0)
Alkaline Phosphatase: 103 U/L (ref 38–126)
Anion gap: 11 (ref 5–15)
BUN: 10 mg/dL (ref 8–23)
CO2: 26 mmol/L (ref 22–32)
Calcium: 9.6 mg/dL (ref 8.9–10.3)
Chloride: 101 mmol/L (ref 98–111)
Creatinine, Ser: 1.21 mg/dL (ref 0.61–1.24)
GFR, Estimated: >60 mL/min
Glucose, Bld: 93 mg/dL (ref 70–99)
Potassium: 4.2 mmol/L (ref 3.5–5.1)
Sodium: 137 mmol/L (ref 135–145)
Total Bilirubin: 0.4 mg/dL (ref 0.0–1.2)
Total Protein: 6.8 g/dL (ref 6.5–8.1)

## 2025-01-24 NOTE — Patient Instructions (Addendum)
 Your procedure is scheduled on: Thursday 01/31/25 Report to the Registration Desk on the 1st floor of the [facility]. To find out your arrival time, please call [phone] between 1PM - 3PM on: Wednesday 01/30/25 If your arrival time is 6:00 am, do not arrive before that time as the [facility] entrance doors do not open until 6:00 am.  REMEMBER: Instructions that are not followed completely may result in serious medical risk, up to and including death; or upon the discretion of your surgeon and anesthesiologist your surgery may need to be rescheduled.  Do not eat food after midnight the night before surgery.  No gum chewing or hard candies.  You may however, drink CLEAR liquids up to 2 hours before you are scheduled to arrive for your surgery. Do not drink anything within 2 hours of your scheduled arrival time.  Clear liquids include: - water  - apple juice without pulp - black coffee or tea (Do NOT add milk or creamers to the coffee or tea) Do NOT drink anything that is not on this list.   In addition, your doctor has ordered for you to drink the provided:  Ensure Pre-Surgery Clear Carbohydrate Drink  Drinking this carbohydrate drink up to two hours before surgery helps to reduce insulin resistance and improve patient outcomes. Please complete drinking 2 hours before scheduled arrival time.  One week prior to surgery: Stop 01/24/25 Stop Anti-inflammatories (NSAIDS) such as Advil , Aleve, Ibuprofen , Motrin , Naproxen, Naprosyn and Aspirin based products such as Excedrin, Goody's Powder, BC Powder. Stop ANY OVER THE COUNTER supplements until after surgery. Cholecalciferol (VITAMIN D3  Multiple Vitamin (MULTIVITAMIN WITH MINERALS)  vitamin B-12 (CYANOCOBALAMIN ) 1000 MCG   You may however, continue to take Tylenol  if needed for pain up until the day of surgery.  Stop apixaban  (ELIQUIS ) 5 MG 3 days prior to surgery. (Take last dose Sunday 01/27/25)  Continue taking all of your other  prescription medications up until the day of surgery.  ON THE DAY OF SURGERY ONLY TAKE THESE MEDICATIONS WITH SIPS OF WATER:  allopurinol  (ZYLOPRIM ) 300 MG  gabapentin  (NEURONTIN ) 300 MG  metoprolol  tartrate (LOPRESSOR ) 25 MG  pantoprazole  (PROTONIX ) 40 MG  tamsulosin  (FLOMAX ) 0.4 MG CAPS  venlafaxine  XR (EFFEXOR -XR) 75 MG  oxyCODONE -acetaminophen  (PERCOCET) 7.5-325 MG    No Alcohol for 24 hours before or after surgery.  No Smoking including e-cigarettes for 24 hours before surgery.  No chewable tobacco products for at least 6 hours before surgery.  No nicotine patches on the day of surgery.  Do not use any recreational drugs for at least a week (preferably 2 weeks) before your surgery.  Please be advised that the combination of cocaine and anesthesia may have negative outcomes, up to and including death. If you test positive for cocaine, your surgery will be cancelled.  On the morning of surgery brush your teeth with toothpaste and water, you may rinse your mouth with mouthwash if you wish. Do not swallow any toothpaste or mouthwash.  Use CHG Soap or wipes as directed on instruction sheet.  Do not wear jewelry, make-up, hairpins, clips or nail polish.  For welded (permanent) jewelry: bracelets, anklets, waist bands, etc.  Please have this removed prior to surgery.  If it is not removed, there is a chance that hospital personnel will need to cut it off on the day of surgery.  Do not wear lotions, powders, or perfumes.   Do not shave body hair from the neck down 48 hours before surgery.  Contact lenses, hearing aids and dentures may not be worn into surgery.  Do not bring valuables to the hospital. [facility] is not responsible for any missing/lost belongings or valuables.   Bring your C-PAP to the hospital in case you may have to spend the night.   Notify your doctor if there is any change in your medical condition (cold, fever, infection).  Wear comfortable clothing  (specific to your surgery type) to the hospital.  After surgery, you can help prevent lung complications by doing breathing exercises.  Take deep breaths and cough every 1-2 hours. Your doctor may order a device called an Incentive Spirometer to help you take deep breaths. When coughing or sneezing, hold a pillow firmly against your incision with both hands. This is called splinting. Doing this helps protect your incision. It also decreases belly discomfort.  If you are being admitted to the hospital overnight, leave your suitcase in the car. After surgery it may be brought to your room.  In case of increased patient census, it may be necessary for you, the patient, to continue your postoperative care in the Same Day Surgery department.  If you are being discharged the day of surgery, you will not be allowed to drive home. You will need a responsible individual to drive you home and stay with you for 24 hours after surgery.   If you are taking public transportation, you will need to have a responsible individual with you.  Please call the Pre-admissions Testing Dept. at [phone] if you have any questions about these instructions.  Surgery Visitation Policy:  Patients having surgery or a procedure may have two visitors.  Children under the age of 42 must have an adult with them who is not the patient.  Inpatient Visitation:    Visiting hours are 7 a.m. to 8 p.m. Up to four visitors are allowed at one time in a patient room. The visitors may rotate out with other people during the day.  One visitor age 50 or older may stay with the patient overnight and must be in the room by 8 p.m.   Merchandiser, Retail to address health-related social needs:  [email or web address]    Pre-operative 4 CHG Bath Instructions   You can play a key role in reducing the risk of infection after surgery. Your skin needs to be as free of germs as possible. You can reduce the number  of germs on your skin by washing with CHG (chlorhexidine  gluconate) soap before surgery. CHG is an antiseptic soap that kills germs and continues to kill germs even after washing.   DO NOT use if you have an allergy to chlorhexidine /CHG or antibacterial soaps. If your skin becomes reddened or irritated, stop using the CHG and notify one of our RNs at [phone].   Please shower with the CHG soap starting 4 days before surgery using the following schedule:     Please keep in mind the following:  DO NOT shave, including legs and underarms, starting the day of your first shower.   You may shave your face at any point before/day of surgery.  Place clean sheets on your bed the day you start using CHG soap. Use a clean washcloth (not used since being washed) for each shower. DO NOT sleep with pets once you start using the CHG.   CHG Shower Instructions:  If you choose to wash your hair and private area, wash first with your normal shampoo/soap.  After you use shampoo/soap, rinse your  hair and body thoroughly to remove shampoo/soap residue.  Turn the water OFF and apply about 3 tablespoons (45 ml) of CHG soap to a CLEAN washcloth.  Apply CHG soap ONLY FROM YOUR NECK DOWN TO YOUR TOES (washing for 3-5 minutes)  DO NOT use CHG soap on face, private areas, open wounds, or sores.  Pay special attention to the area where your surgery is being performed.  If you are having back surgery, having someone wash your back for you may be helpful. Wait 2 minutes after CHG soap is applied, then you may rinse off the CHG soap.  Pat dry with a clean towel  Put on clean clothes/pajamas   If you choose to wear lotion, please use ONLY the CHG-compatible lotions on the back of this paper.     Additional instructions for the day of surgery: DO NOT APPLY any lotions, deodorants, cologne, or perfumes.   Put on clean/comfortable clothes.  Brush your teeth.  Ask your nurse before applying any prescription  medications to the skin.      CHG Compatible Lotions   Aveeno Moisturizing lotion  Cetaphil Moisturizing Cream  Cetaphil Moisturizing Lotion  Clairol Herbal Essence Moisturizing Lotion, Dry Skin  Clairol Herbal Essence Moisturizing Lotion, Extra Dry Skin  Clairol Herbal Essence Moisturizing Lotion, Normal Skin  Curel Age Defying Therapeutic Moisturizing Lotion with Alpha Hydroxy  Curel Extreme Care Body Lotion  Curel Soothing Hands Moisturizing Hand Lotion  Curel Therapeutic Moisturizing Cream, Fragrance-Free  Curel Therapeutic Moisturizing Lotion, Fragrance-Free  Curel Therapeutic Moisturizing Lotion, Original Formula  Eucerin Daily Replenishing Lotion  Eucerin Dry Skin Therapy Plus Alpha Hydroxy Crme  Eucerin Dry Skin Therapy Plus Alpha Hydroxy Lotion  Eucerin Original Crme  Eucerin Original Lotion  Eucerin Plus Crme Eucerin Plus Lotion  Eucerin TriLipid Replenishing Lotion  Keri Anti-Bacterial Hand Lotion  Keri Deep Conditioning Original Lotion Dry Skin Formula Softly Scented  Keri Deep Conditioning Original Lotion, Fragrance Free Sensitive Skin Formula  Keri Lotion Fast Absorbing Fragrance Free Sensitive Skin Formula  Keri Lotion Fast Absorbing Softly Scented Dry Skin Formula  Keri Original Lotion  Keri Skin Renewal Lotion Keri Silky Smooth Lotion  Keri Silky Smooth Sensitive Skin Lotion  Nivea Body Creamy Conditioning Oil  Nivea Body Extra Enriched Lotion  Nivea Body Original Lotion  Nivea Body Sheer Moisturizing Lotion Nivea Crme  Nivea Skin Firming Lotion  NutraDerm 30 Skin Lotion  NutraDerm Skin Lotion  NutraDerm Therapeutic Skin Cream  NutraDerm Therapeutic Skin Lotion  ProShield Protective Hand Cream  Provon moisturizing lotion  How to Use an Incentive Spirometer  An incentive spirometer is a tool that measures how well you are filling your lungs with each breath. Learning to take long, deep breaths using this tool can help you keep your lungs clear and  active. This may help to reverse or lessen your chance of developing breathing (pulmonary) problems, especially infection. You may be asked to use a spirometer: After a surgery. If you have a lung problem or a history of smoking. After a long period of time when you have been unable to move or be active. If the spirometer includes an indicator to show the highest number that you have reached, your health care provider or respiratory therapist will help you set a goal. Keep a log of your progress as told by your health care provider. What are the risks? Breathing too quickly may cause dizziness or cause you to pass out. Take your time so you do not get dizzy or  light-headed. If you are in pain, you may need to take pain medicine before doing incentive spirometry. It is harder to take a deep breath if you are having pain. How to use your incentive spirometer  Sit up on the edge of your bed or on a chair. Hold the incentive spirometer so that it is in an upright position. Before you use the spirometer, breathe out normally. Place the mouthpiece in your mouth. Make sure your lips are closed tightly around it. Breathe in slowly and as deeply as you can through your mouth, causing the piston or the ball to rise toward the top of the chamber. Hold your breath for 3-5 seconds, or for as long as possible. If the spirometer includes a coach indicator, use this to guide you in breathing. Slow down your breathing if the indicator goes above the marked areas. Remove the mouthpiece from your mouth and breathe out normally. The piston or ball will return to the bottom of the chamber. Rest for a few seconds, then repeat the steps 10 or more times. Take your time and take a few normal breaths between deep breaths so that you do not get dizzy or light-headed. Do this every 1-2 hours when you are awake. If the spirometer includes a goal marker to show the highest number you have reached (best effort), use this as  a goal to work toward during each repetition. After each set of 10 deep breaths, cough a few times. This will help to make sure that your lungs are clear. If you have an incision on your chest or abdomen from surgery, place a pillow or a rolled-up towel firmly against the incision when you cough. This can help to reduce pain while taking deep breaths and coughing. General tips When you are able to get out of bed: Walk around often. Continue to take deep breaths and cough in order to clear your lungs. Keep using the incentive spirometer until your health care provider says it is okay to stop using it. If you have been in the hospital, you may be told to keep using the spirometer at home. Contact a health care provider if: You are having difficulty using the spirometer. You have trouble using the spirometer as often as instructed. Your pain medicine is not giving enough relief for you to use the spirometer as told. You have a fever. Get help right away if: You develop shortness of breath. You develop a cough with bloody mucus from the lungs. You have fluid or blood coming from an incision site after you cough. Summary An incentive spirometer is a tool that can help you learn to take long, deep breaths to keep your lungs clear and active. You may be asked to use a spirometer after a surgery, if you have a lung problem or a history of smoking, or if you have been inactive for a long period of time. Use your incentive spirometer as instructed every 1-2 hours while you are awake. If you have an incision on your chest or abdomen, place a pillow or a rolled-up towel firmly against your incision when you cough. This will help to reduce pain. Get help right away if you have shortness of breath, you cough up bloody mucus, or blood comes from your incision when you cough. This information is not intended to replace advice given to you by your health care provider. Make sure you discuss any questions you  have with your health care provider.   Please go  to the following website to access important education materials concerning your upcoming joint replacement.                                   [email or web address]

## 2025-01-31 ENCOUNTER — Ambulatory Visit: Admission: RE | Admit: 2025-01-31 | Source: Ambulatory Visit | Admitting: Orthopedic Surgery

## 2025-01-31 ENCOUNTER — Encounter: Admission: RE | Payer: Self-pay | Source: Ambulatory Visit

## 2025-01-31 ENCOUNTER — Encounter: Payer: Self-pay | Admitting: Urgent Care

## 2025-01-31 SURGERY — ARTHROPLASTY, HIP, TOTAL, ANTERIOR APPROACH
Anesthesia: Choice | Site: Hip | Laterality: Left

## 2025-02-27 ENCOUNTER — Ambulatory Visit: Admitting: Dermatology

## 2025-06-10 ENCOUNTER — Ambulatory Visit: Admitting: Urology
